# Patient Record
Sex: Female | Born: 1945 | Race: White | Hispanic: No | Marital: Single | Smoking: Never smoker
Health system: Southern US, Community
[De-identification: ages and names within clinical notes are randomized; demographics above are authoritative.]

## PROBLEM LIST (undated history)

## (undated) DIAGNOSIS — I10 Essential (primary) hypertension: Secondary | ICD-10-CM

## (undated) DIAGNOSIS — E785 Hyperlipidemia, unspecified: Secondary | ICD-10-CM

## (undated) DIAGNOSIS — E079 Disorder of thyroid, unspecified: Secondary | ICD-10-CM

## (undated) DIAGNOSIS — K219 Gastro-esophageal reflux disease without esophagitis: Secondary | ICD-10-CM

## (undated) NOTE — Telephone Encounter (Signed)
Formatting of this note might be different from the original.  I called to speak with Valeria Batman about the results of the total spine MRI, as reviewed in film conference with Dr. Katrinka Blazing.  The study/studies reveal degenerative changes with only mild stenosis in the cervical and thoracic regions; there is no cord compression or high grade canal stenosis.  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  there is no high grade canal stenosis.  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  The patient was very disheartened to hear that the cause of the incontinence is still unknown, however, was appreciative of the information.  If she becomes more symptomatic from the lumbar foraminal stenosis, she may benefit from a steroid injection.    Additionally, the imaging reported incidental findings of hepatic and splenic cysts with recommendations for ultrasound, if not previously investigated.  The patient is from [place] and will follow up locally.  She did not have the PCP information available to her at the time of the call, but plans to call us back with the doctor's name and number.  I will send the imaging results and our recommendations to the PCP once I have the contact information.  She requested a copy of the results and notes so was provided with the phone number to Medical Records.       The patient is always welcome to contact our office or return to clinic with additional questions/concerns.      Electronically signed by Daiva Nakayama, PA-C at 08/07/2016  4:22 PM EDT

---

## 2014-01-07 IMAGING — MG MAMMOGRAPHY SCREENING BILATERAL 3D TOMOSYNTHESIS WITH CAD
12 of 16 series · 12 of 32 positions shown · non-contrast
Comparison: January 16, 2011
BREAST DENSITY: (Level B) There are scattered areas of fibroglandular density.

MAMMOGRAPHY SCREENING 3D TOMOSYNTHESIS WITH CAD, 01/07/2014 [DATE]:
HISTORY: Screening
TECHNIQUE: Bilateral oblique mediolateral and craniocaudal full field digital
mammogram and 3-D Tomosynthesis were obtained.  In addition, computer-aided
detection was utilized.

[R MLO synth-2D]
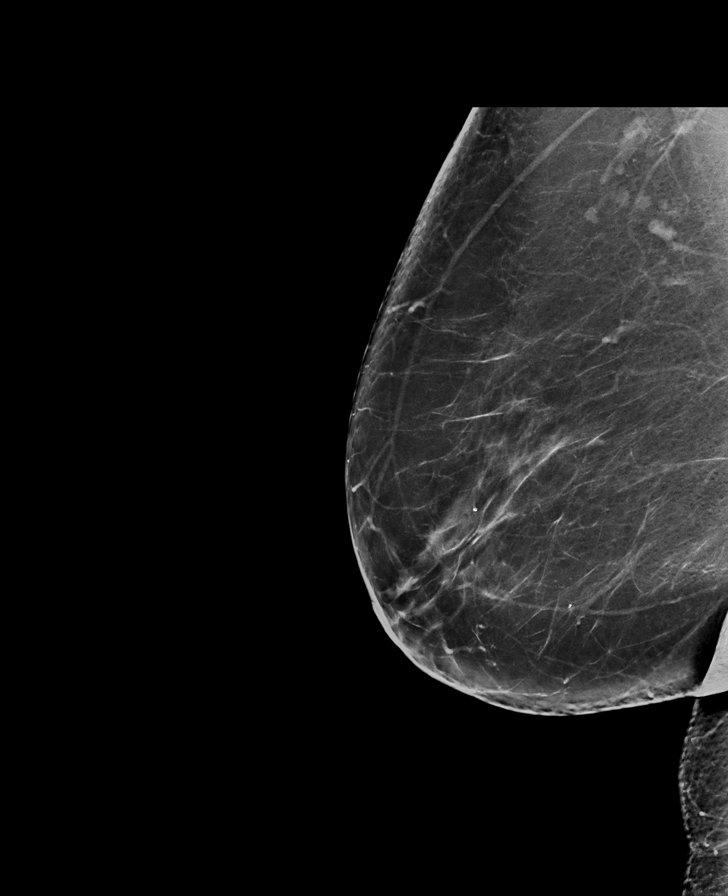

[L MLO synth-2D]
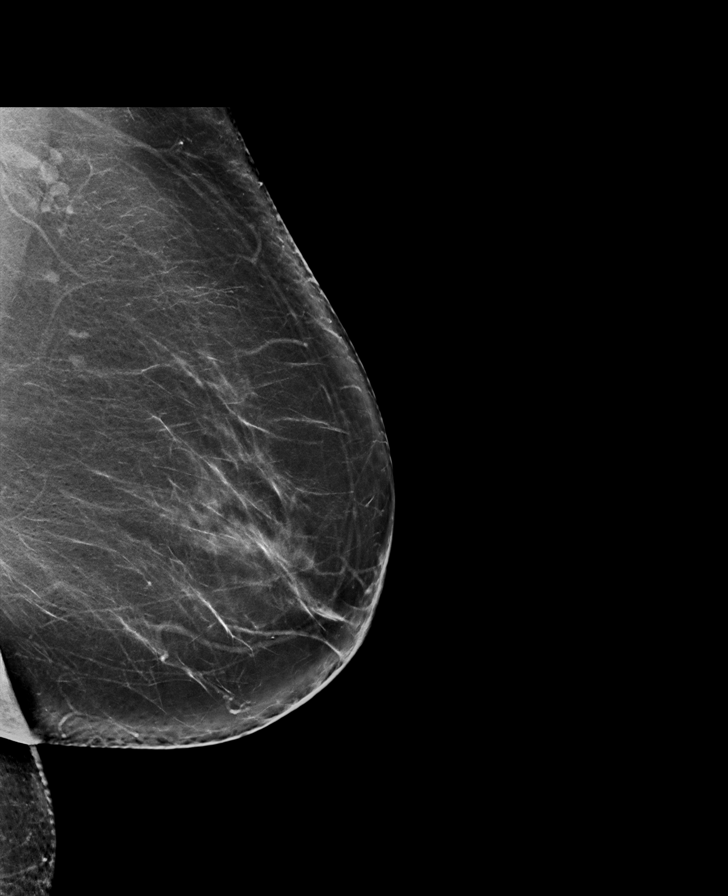

[R CC synth-2D]
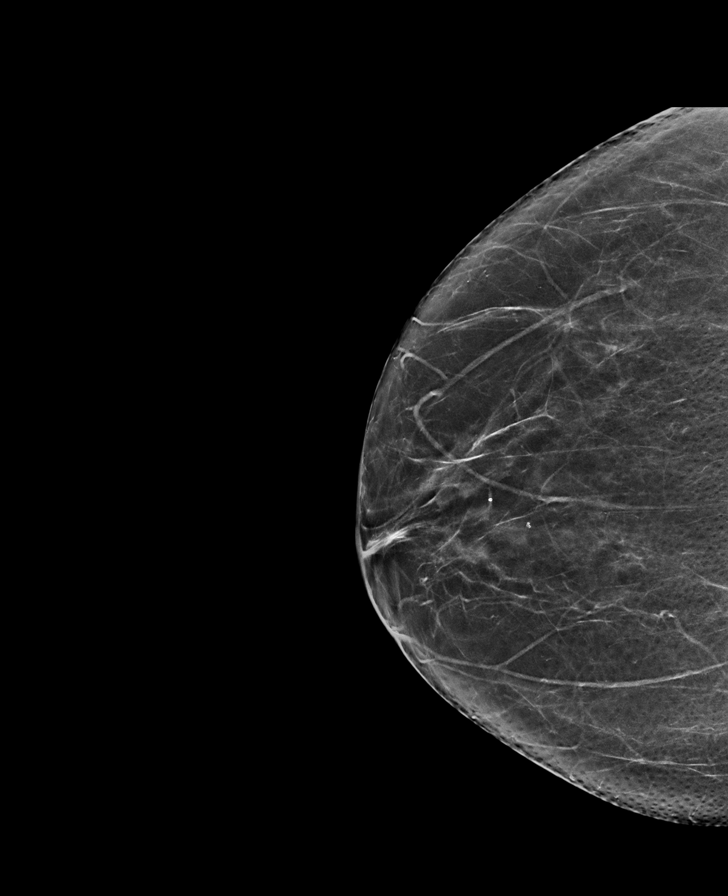

[R MLO]
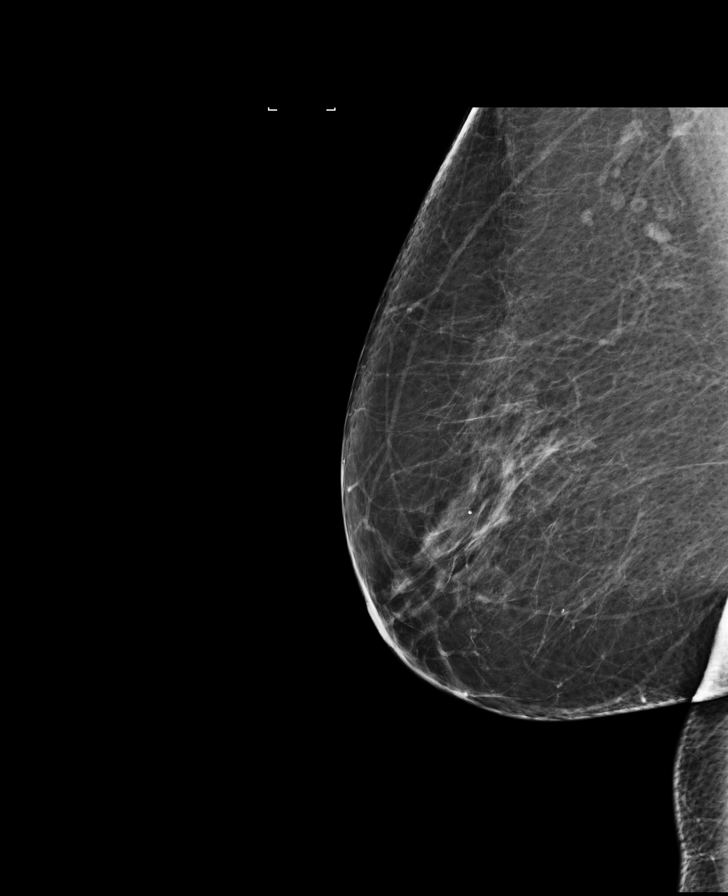

[L CC synth-2D]
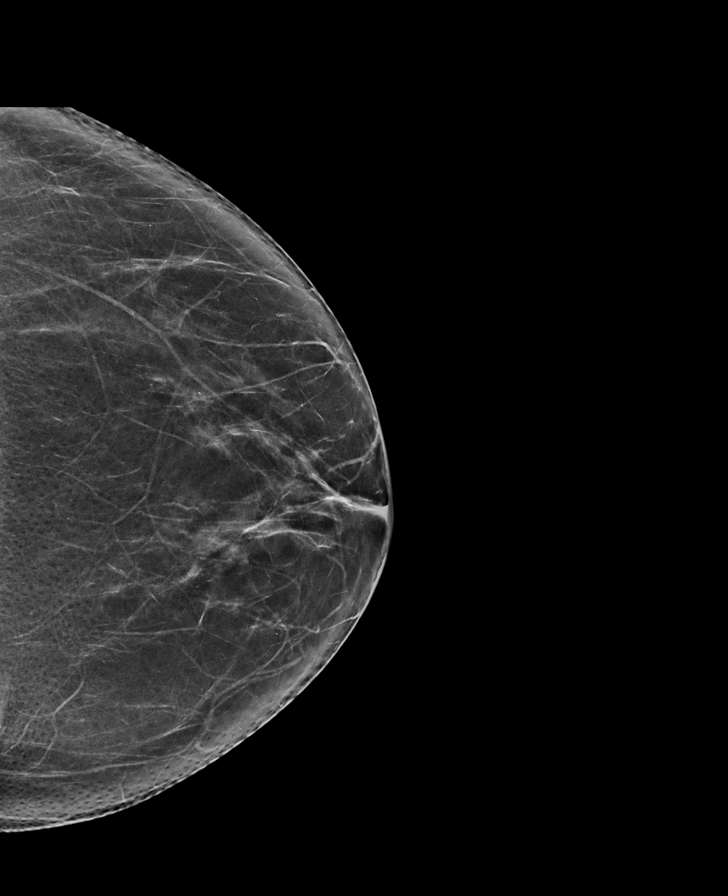

[L MLO]
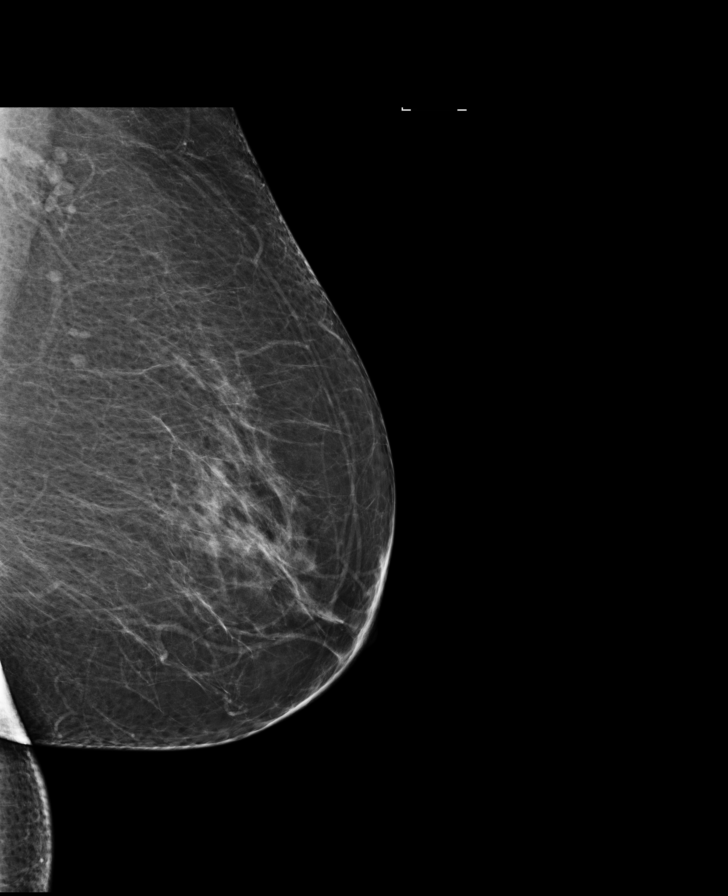

[R CC]
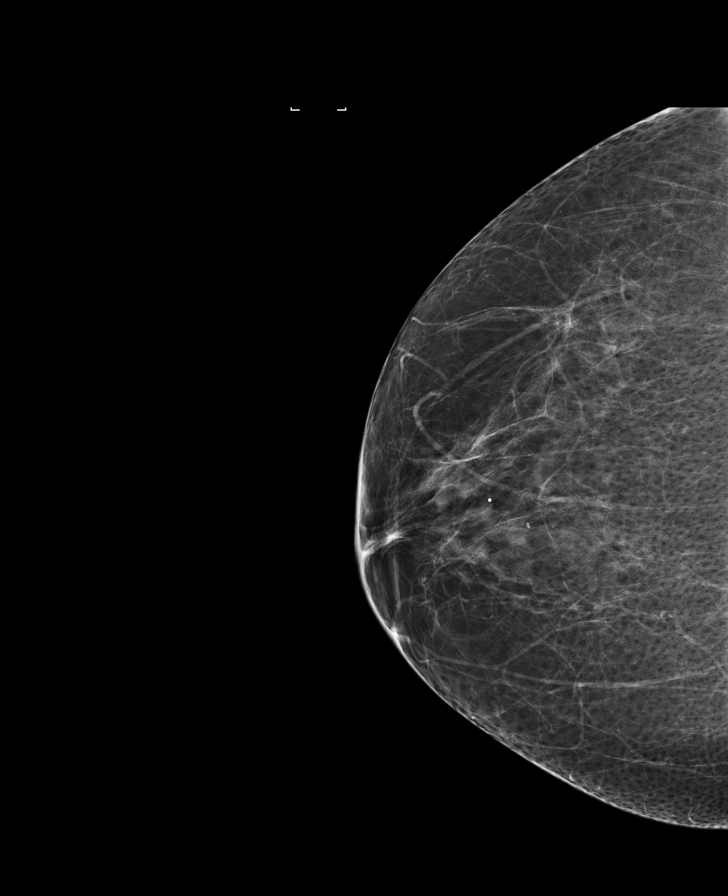

[L CC]
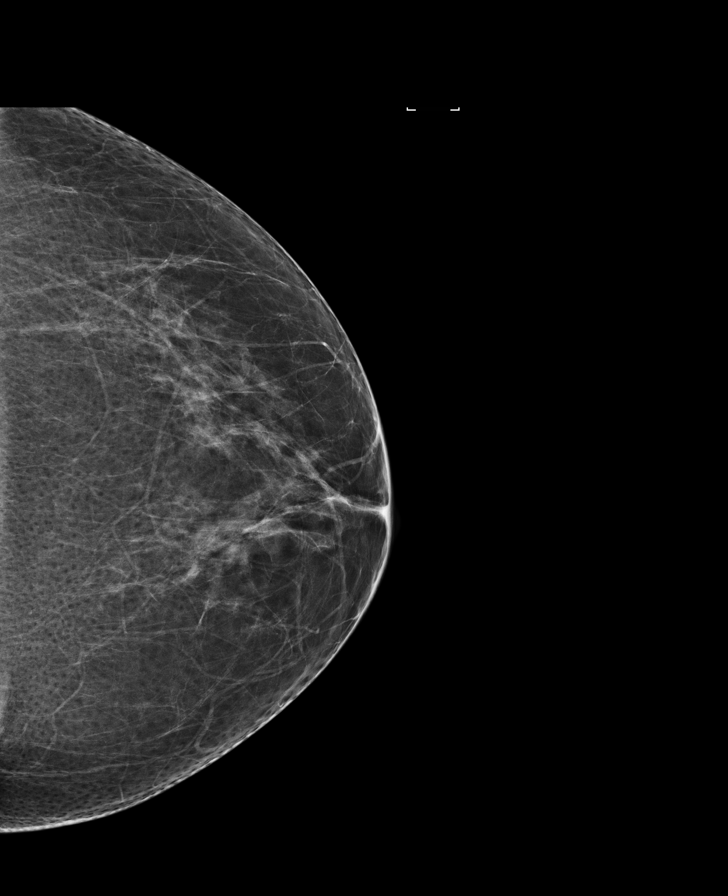

[R CC tomo]
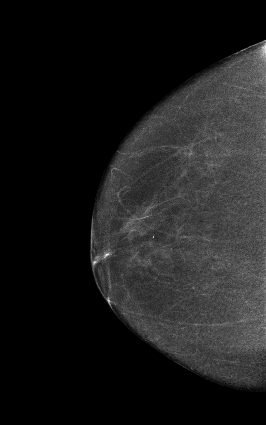

[R MLO tomo]
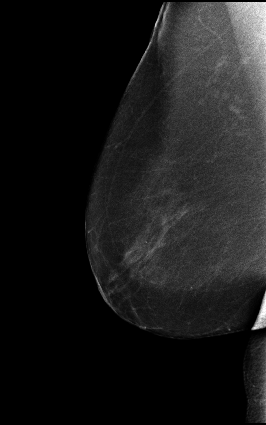

[L MLO tomo]
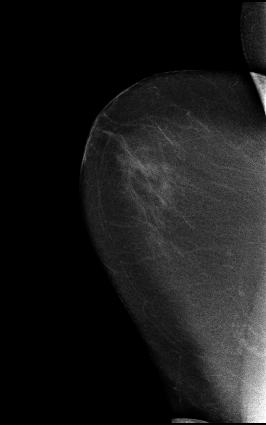

[L CC tomo]
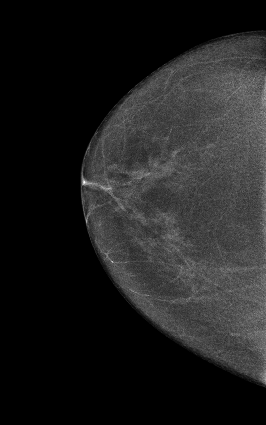

[12 of 32 positions shown; findings below may reference images not displayed]

FINDINGS: There is no dominant or new, developing mass or suspicious cluster of
microcalcifications. There is no skin thickening, architectural distortion or
nipple retraction.
IMPRESSION: ( BI-RADS 2) Benign findings. Routine mammographic follow-up is recommended.

## 2016-11-29 IMAGING — CT CT ABDOMEN WITHOUT CONTRAST
2 of 3 series · 16 of 46 positions shown, 18 images · non-contrast
Comparison: There are no prior exam(s) available for comparison within the
past
12 months; however, comparison was made to the prior exam(s) dated  CT abdomen

CT ABDOMEN WITHOUT CONTRAST, 11/29/2016 [DATE]:
CLINICAL INDICATION:  Left upper quadrant mass
A search for DICOM formatted images was conducted for prior CT imaging studies
completed at a non-affiliated media free facility.
TECHNIQUE: The abdomen was scanned from lung bases through the aortic
bifurcation without contrast on a high resolution CT scanner using dose
reduction techniques. Routine MPR reconstructions were performed.

[Series 2: abd/pel ax wo · axial · 0.94mm/px · z∈[-269,-17]mm · 13 of 98 slices shown, 15 images]
[im 7/98  soft-tissue]
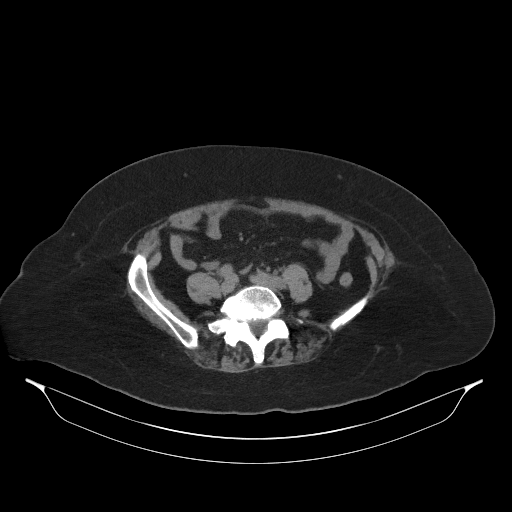
[im 7/98  bone]
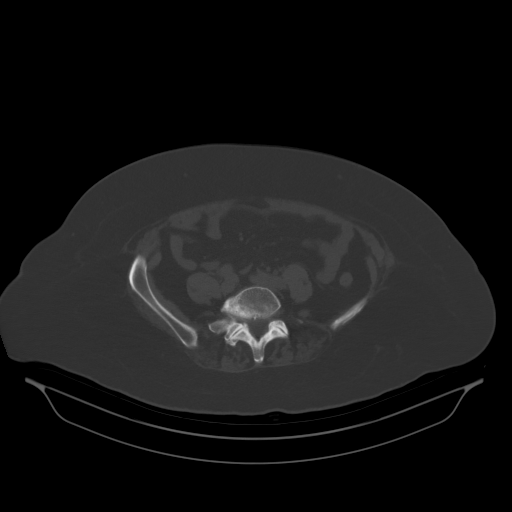
[im 13/98  soft-tissue]
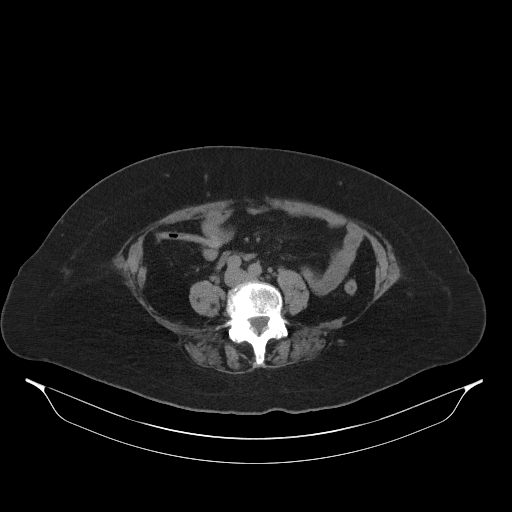
[im 19/98  soft-tissue]
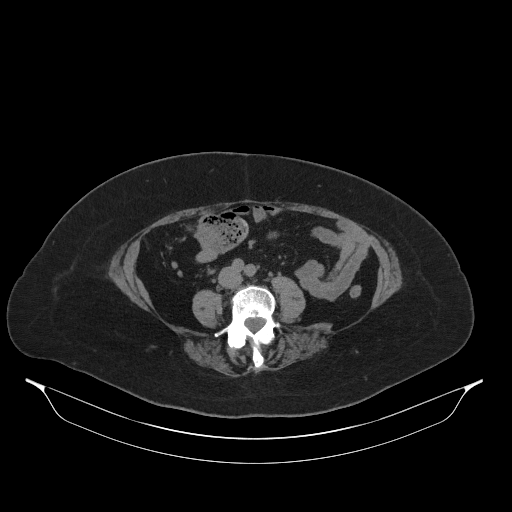
[im 29/98  soft-tissue]
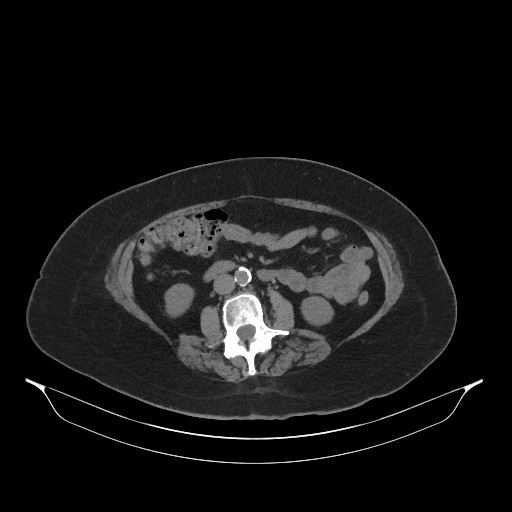
[im 35/98  soft-tissue]
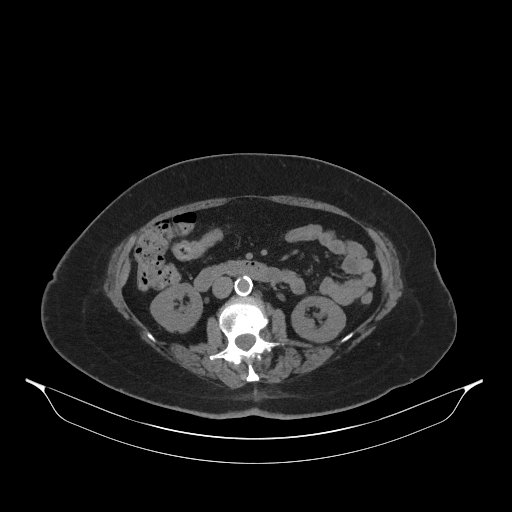
[im 41/98  soft-tissue]
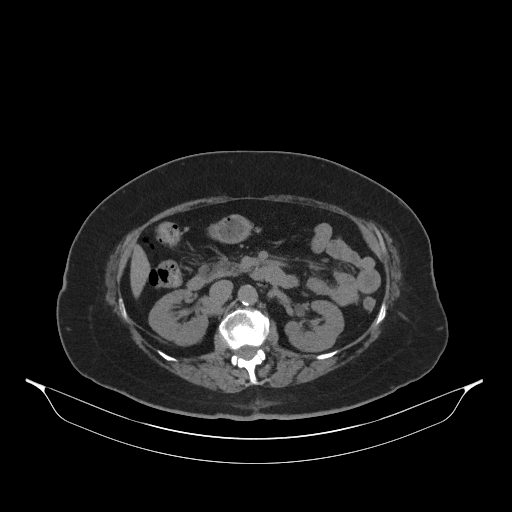
[im 51/98  soft-tissue]
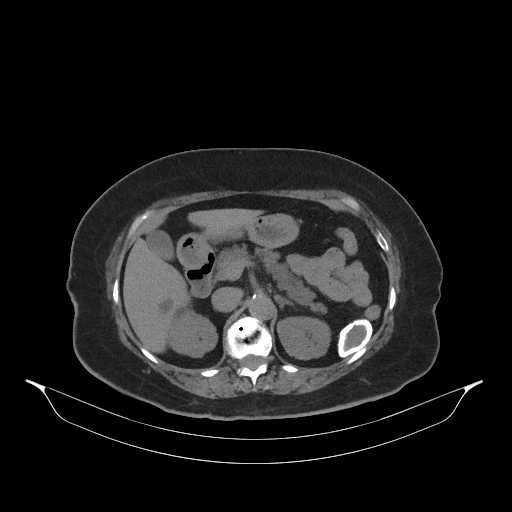
[im 57/98  soft-tissue]
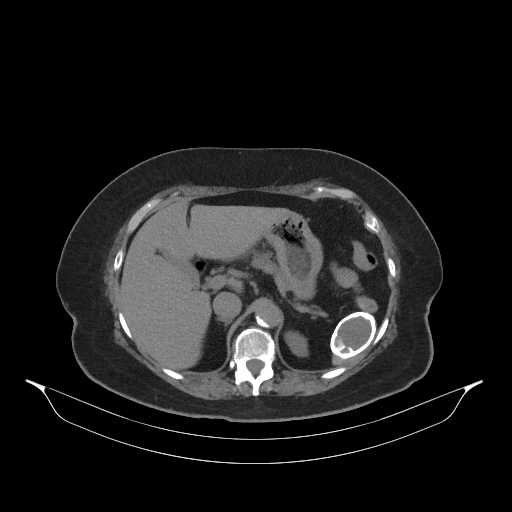
[im 63/98  soft-tissue]
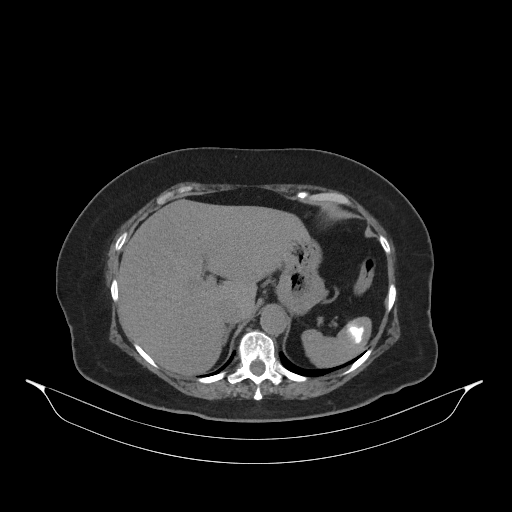
[im 63/98  bone]
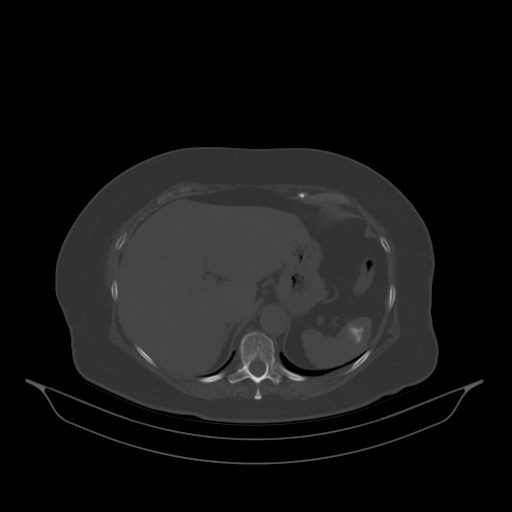
[im 69/98  soft-tissue]
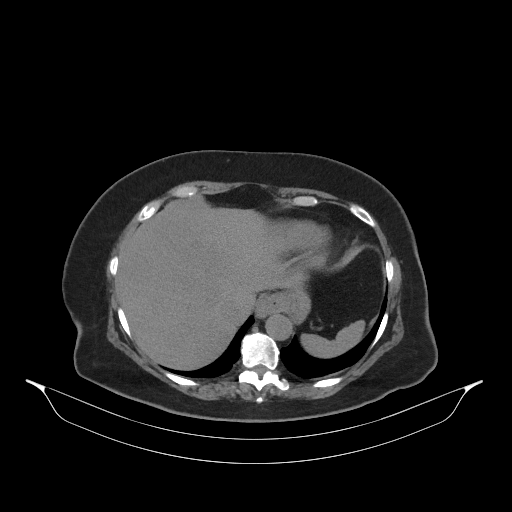
[im 79/98  soft-tissue]
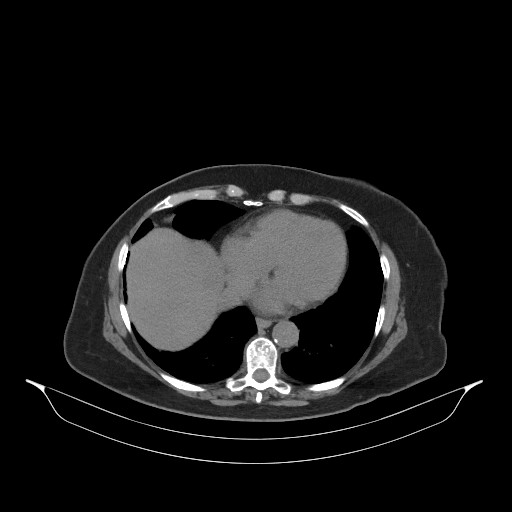
[im 85/98  soft-tissue]
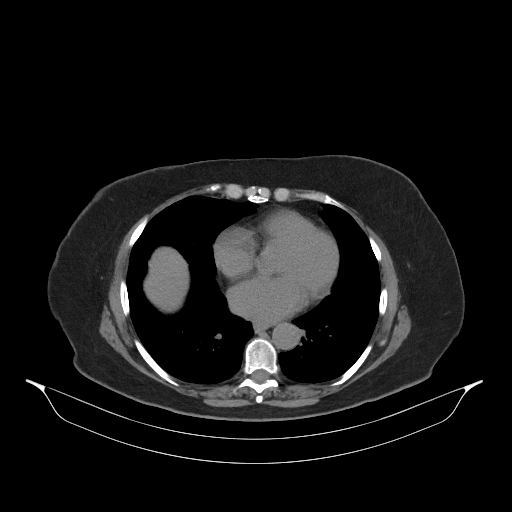
[im 91/98  soft-tissue]
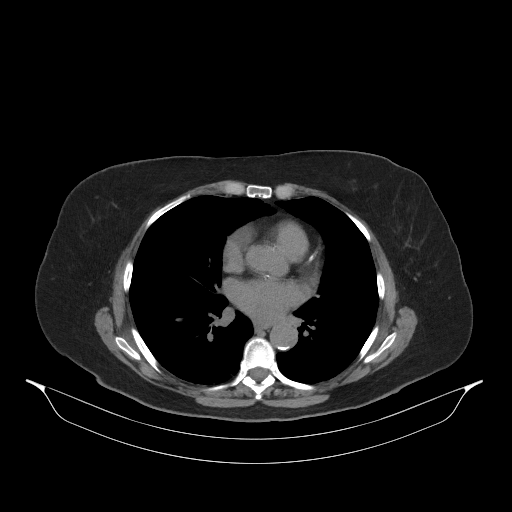

[Series 3: abd/pel cor wo · coronal · 0.57mm/px · 3 of 129 slices shown]
[im 43/129  soft-tissue]
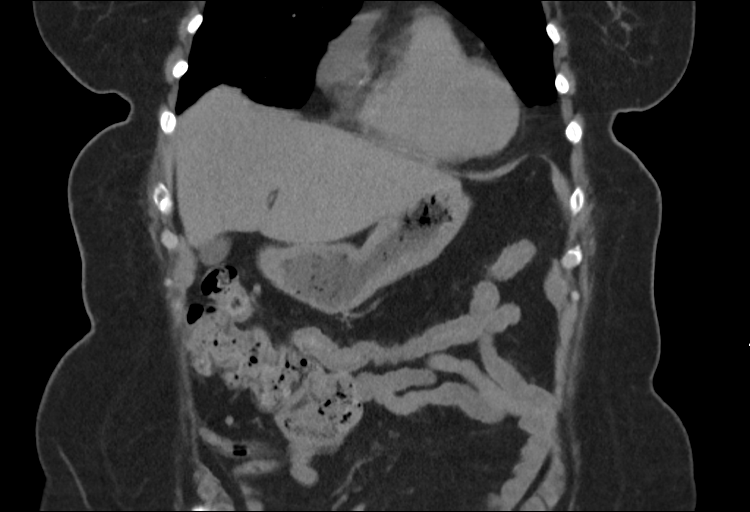
[im 57/129  soft-tissue]
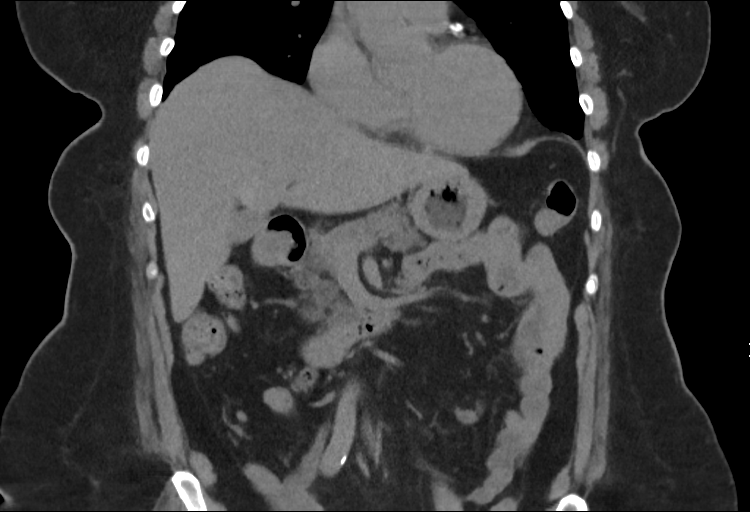
[im 72/129  soft-tissue]
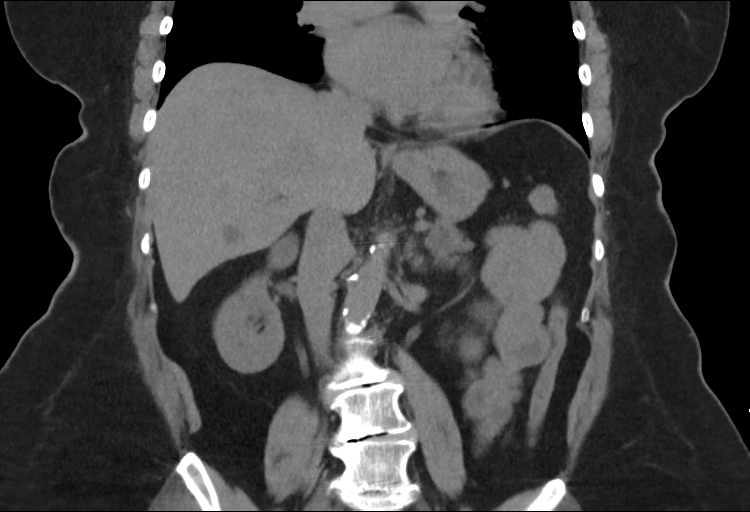

[16 of 46 positions shown; findings below may reference images not displayed]

FINDINGS: Lung bases are clear. Heart size is borderline prominent. There are
moderate coronary artery calcifications. There is no pleural effusion. A
chronic
calcified splenic cyst is stable compared with 3199, likely sequelae of prior
infection or hemorrhage. This measures 4.7 x 3.2 cm. There is a septated right
hepatic lobe cyst which also show stability. There is no adenopathy or
ascites.
Kidneys are unremarkable. No adrenal mass. Pancreas is normal. Gallbladder is
unremarkable.
IMPRESSION: Chronic calcified splenic cyst is stable compared to 3199. Small septated
right
hepatic lobe cyst also show stability. No evidence for developing mass,
adenopathy or active inflammatory process.
Moderate coronary artery calcifications.
RADIATION DOSE REDUCTION: All CT scans are performed using radiation dose
reduction techniques, when applicable.  Technical factors are evaluated and
adjusted to ensure appropriate moderation of exposure.  Automated dose
management technology is applied to adjust the radiation doses to minimize
exposure while achieving diagnostic quality images.

## 2017-01-29 IMAGING — MG MAMMOGRAPHY SCREENING BILATERAL 3D TOMOSYNTHESIS WITH CAD
12 series · 12 of 28 positions shown · non-contrast
Comparison: 01/07/2014
BREAST DENSITY: (Level A) The breasts are almost entirely fatty.

MAMMOGRAPHY SCREENING BILATERAL 3D TOMOSYNTHESIS WITH CAD, 01/29/2017 [DATE]:
CLINICAL INDICATION: Screening.
TECHNIQUE: Digital bilateral mammograms and 3-D Tomosynthesis were obtained.
These were interpreted both primarily and with the aid of computer-aided
detection system.

[R MLO synth-2D]
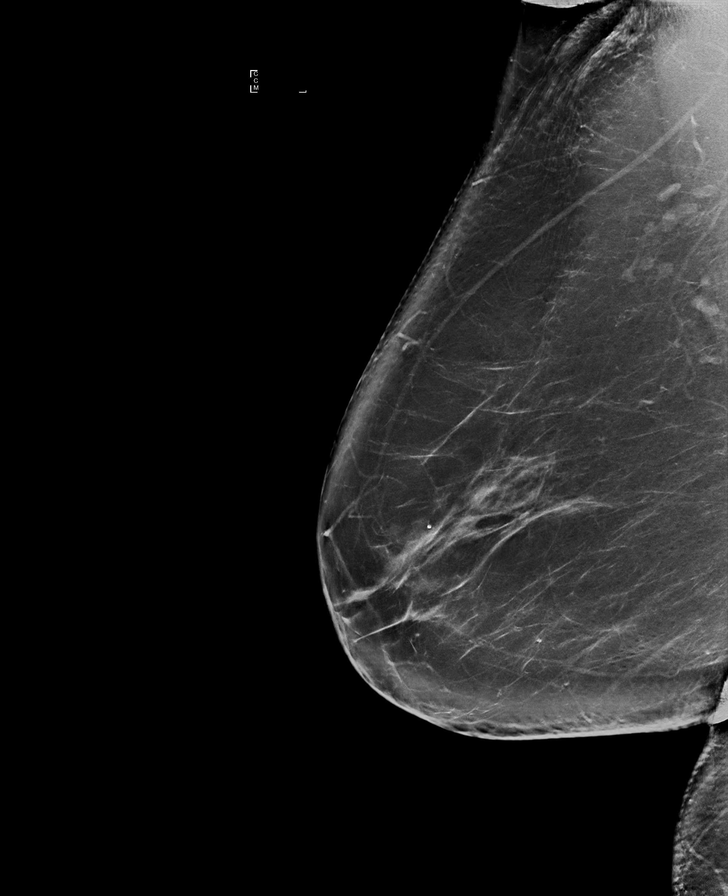

[L CC synth-2D]
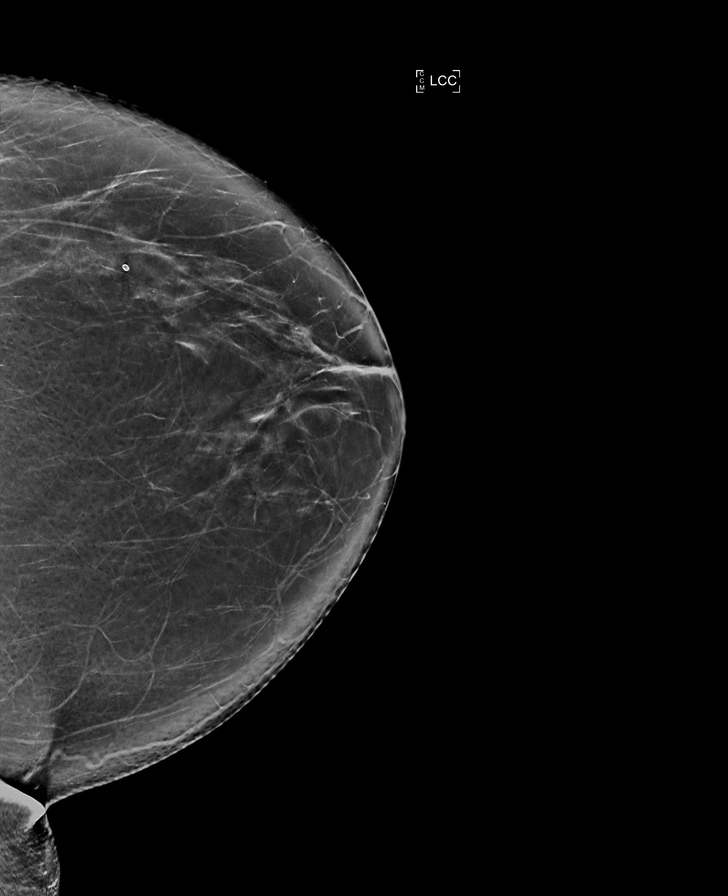

[L MLO synth-2D]
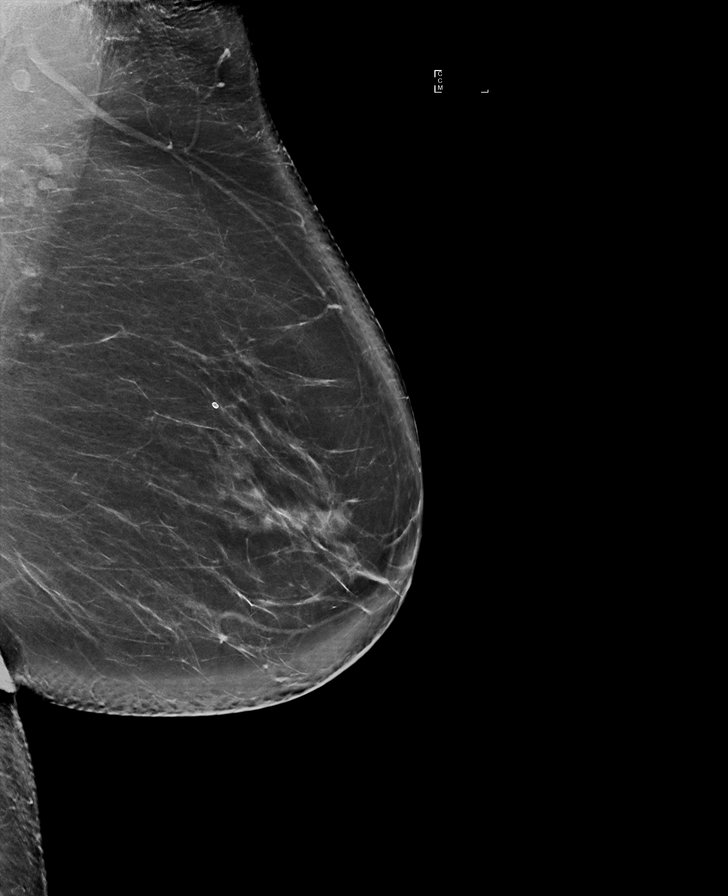

[R CC synth-2D]
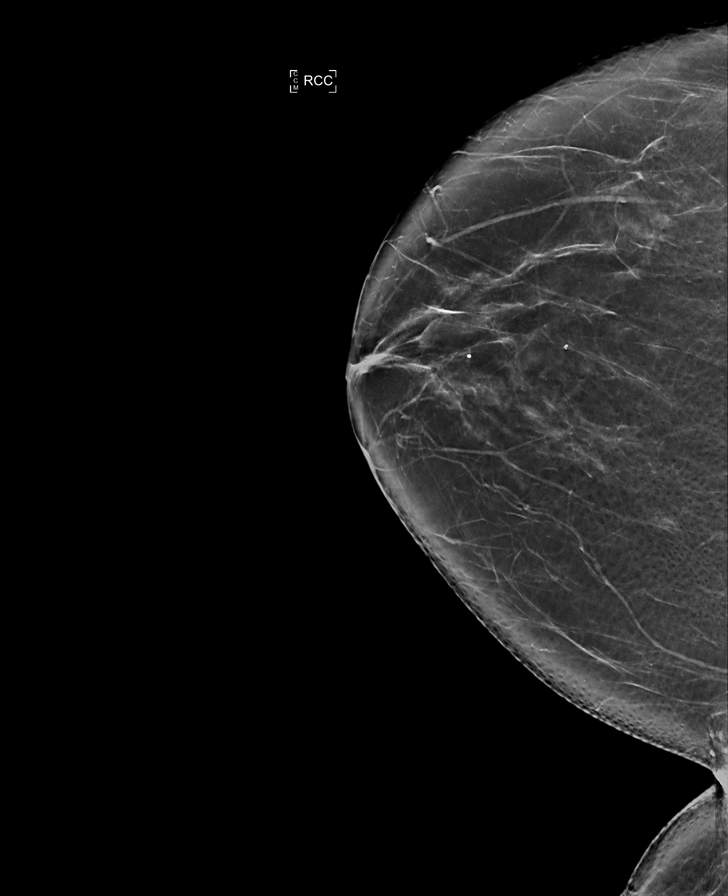

[R MLO tomo (1 of 2)]
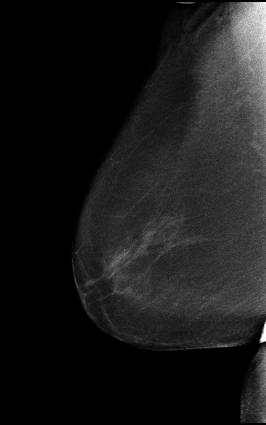

[R CC tomo (1 of 2)]
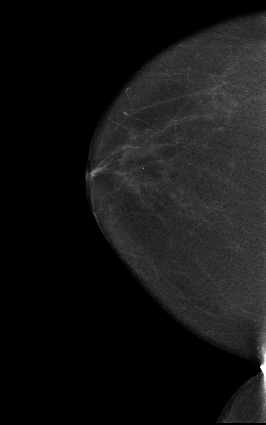

[L CC tomo (1 of 2)]
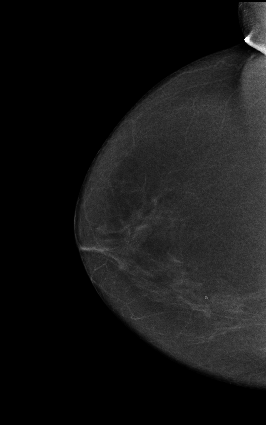

[L MLO tomo (1 of 2)]
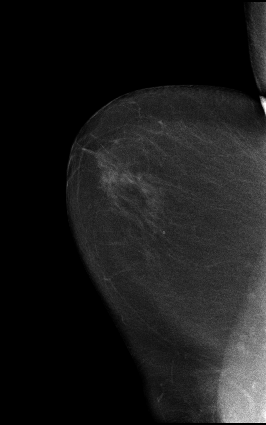

[R CC tomo (2 of 2) · tomo slice 45/90.0]
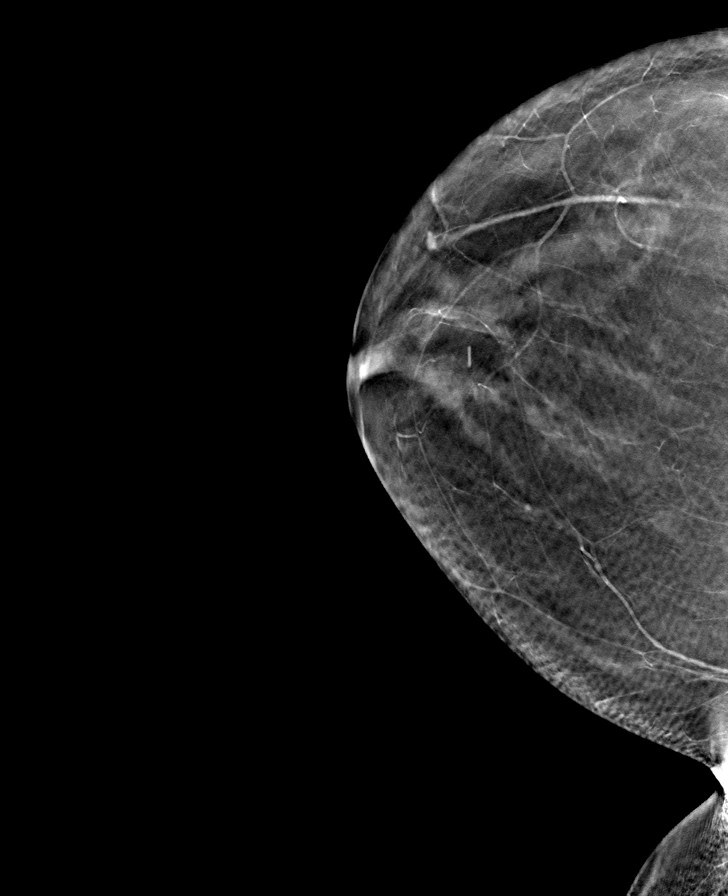

[L MLO tomo (2 of 2) · tomo slice 54/107.0]
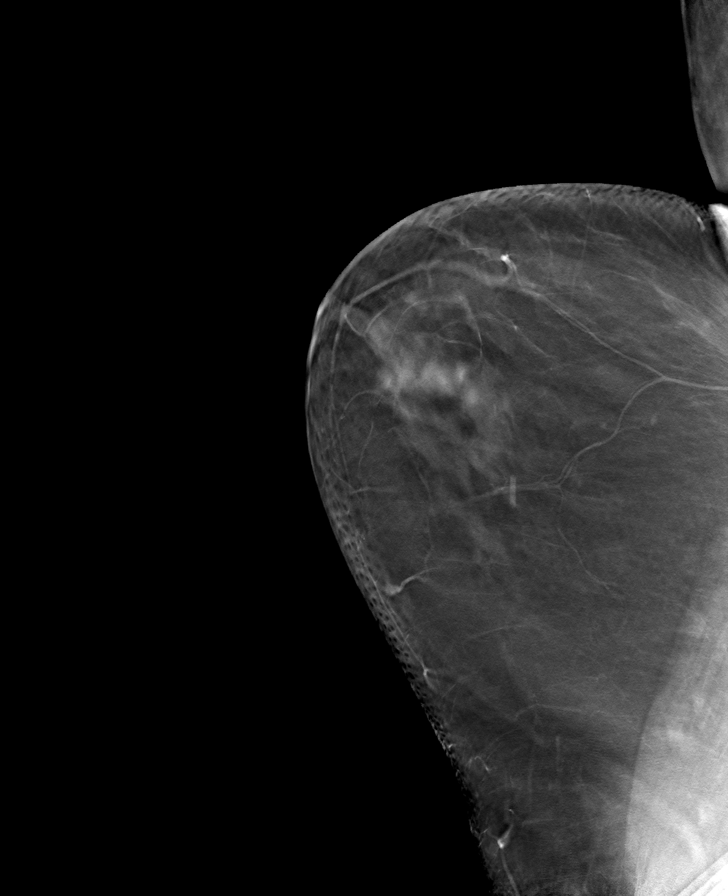

[R MLO tomo (2 of 2) · tomo slice 57/113.0]
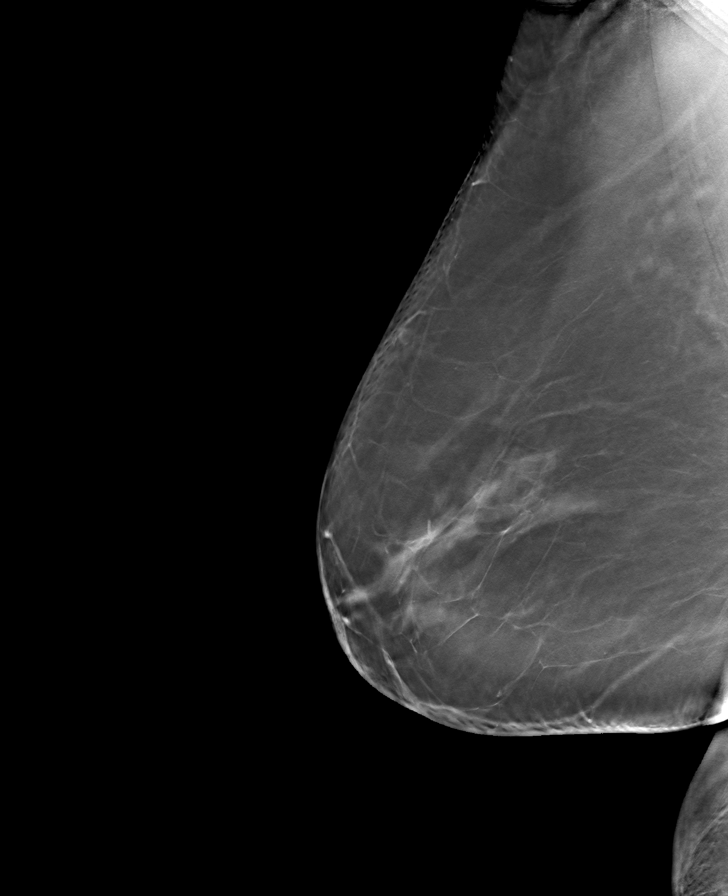

[L CC tomo (2 of 2) · tomo slice 47/92.0]
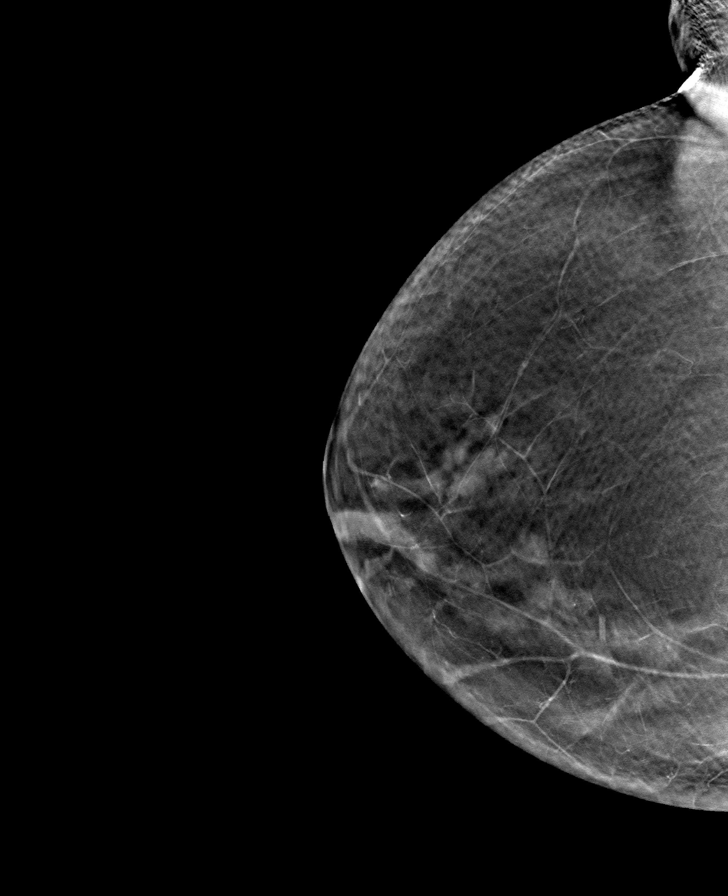

[12 of 28 positions shown; findings below may reference images not displayed]

FINDINGS: No mass, area of architectural distortion or suspicious
calcification. No mammographic evidence of malignancy.
IMPRESSION: ( BI-RADS 1) Negative mammogram. Routine mammographic follow-up is
recommended.

## 2019-01-06 IMAGING — CT CT ABDOMEN AND PELVIS WITH CONTRAST
2 of 3 series · 16 of 46 positions shown, 18 images · IV contrast (APPLIED)
Comparison: 11/29/2016

CT ABDOMEN AND PELVIS WITH CONTRAST, 01/06/2019 [DATE]: 
CLINICAL INDICATION:  72-year-old female with 30 pound weight loss over one 
year, left lower quadrant pain, change in bowels 
A search for DICOM formatted images was conducted for prior CT imaging studies 
completed at a non-affiliated media free facility.
TECHNIQUE: The abdomen and pelvis were scanned from lung bases through the 
pubic rami with 100 cc's of Isovue 300 injected intravenously on a 
high-resolution Ct scanner using dose reduction techniques.  Routine MPR 
reconstructions were performed.

[Series 4: abd/pel ax w · axial · 0.81mm/px · z∈[-385,+8]mm · 13 of 151 slices shown, 15 images]
[im 10/151  soft-tissue]
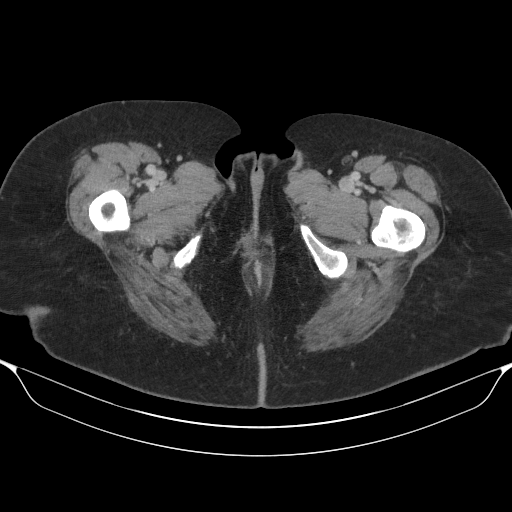
[im 10/151  bone]
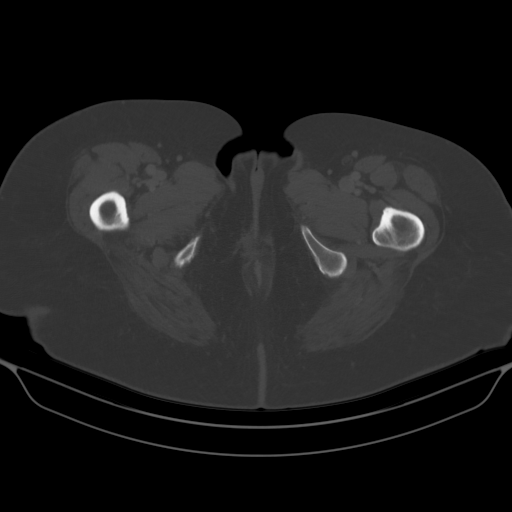
[im 20/151  soft-tissue]
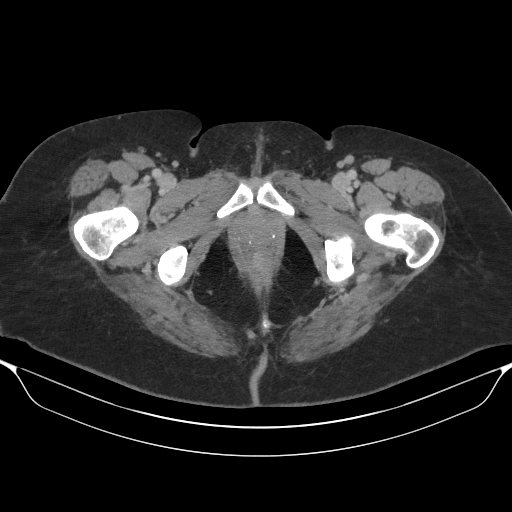
[im 30/151  soft-tissue]
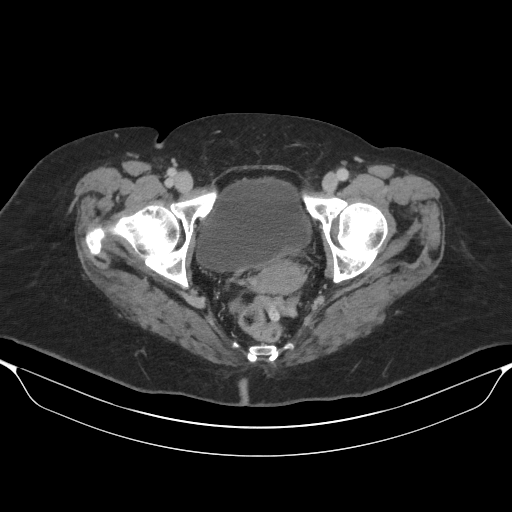
[im 44/151  soft-tissue]
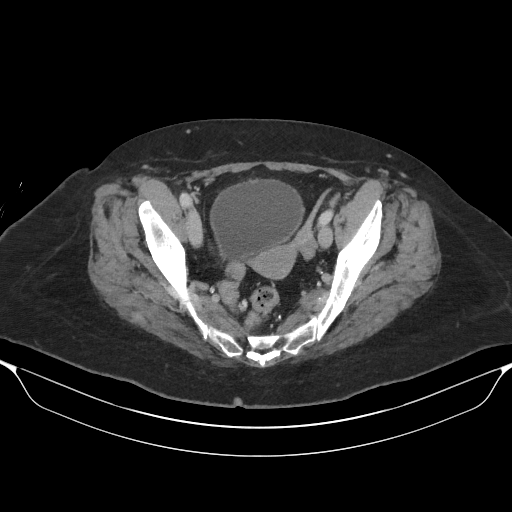
[im 54/151  soft-tissue]
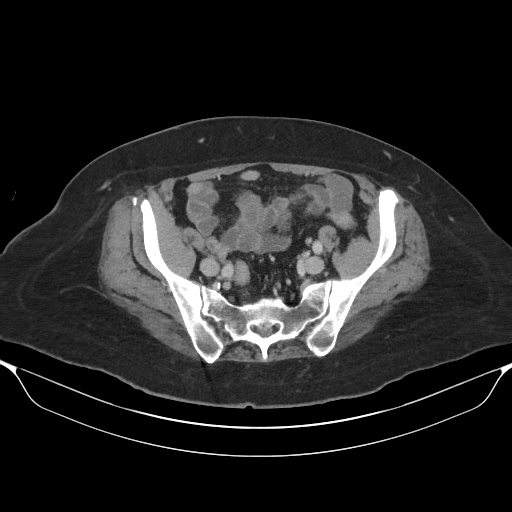
[im 63/151  soft-tissue]
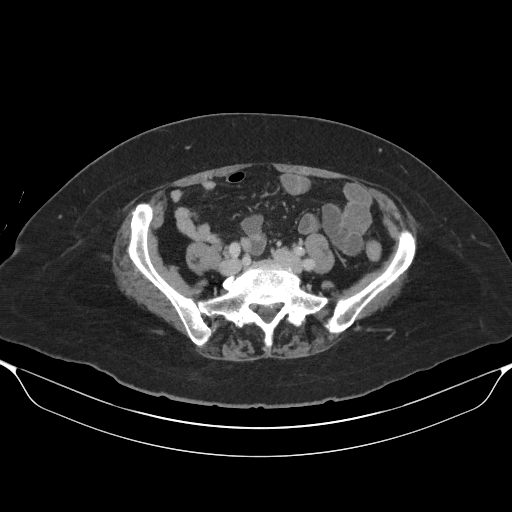
[im 78/151  soft-tissue]
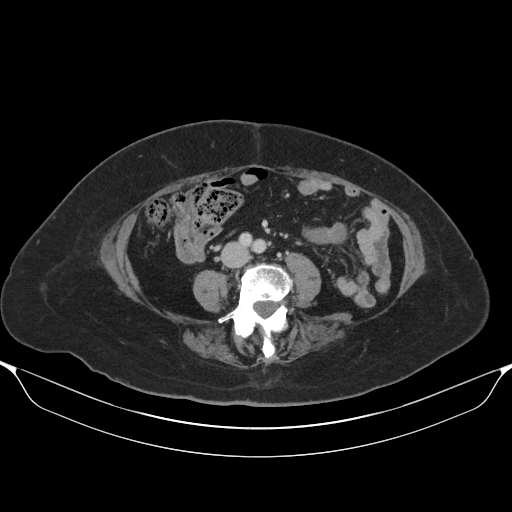
[im 88/151  soft-tissue]
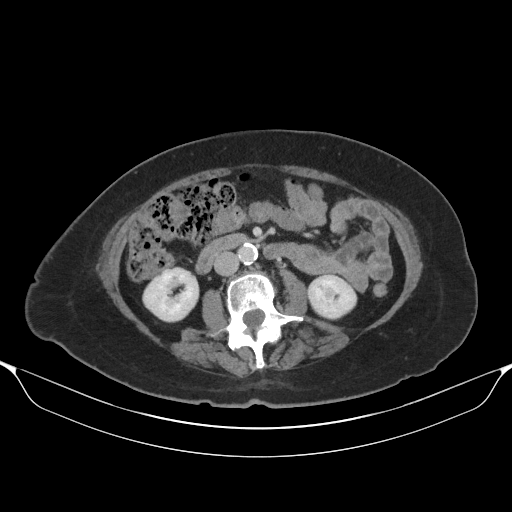
[im 97/151  soft-tissue]
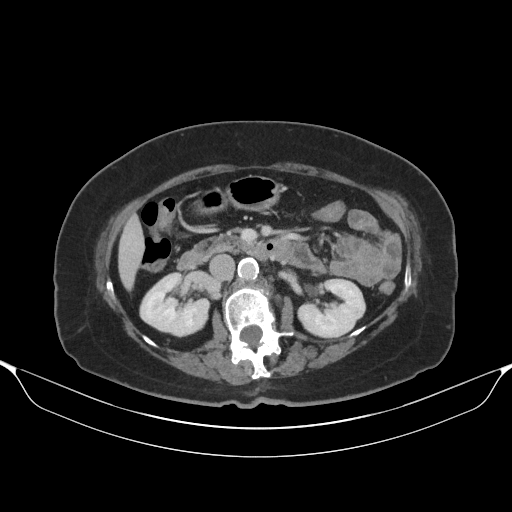
[im 97/151  bone]
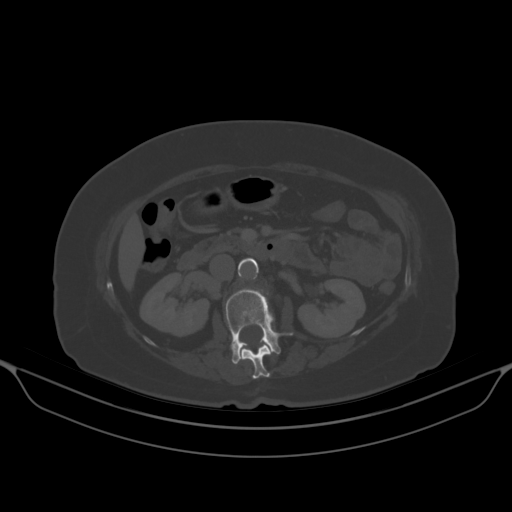
[im 107/151  soft-tissue]
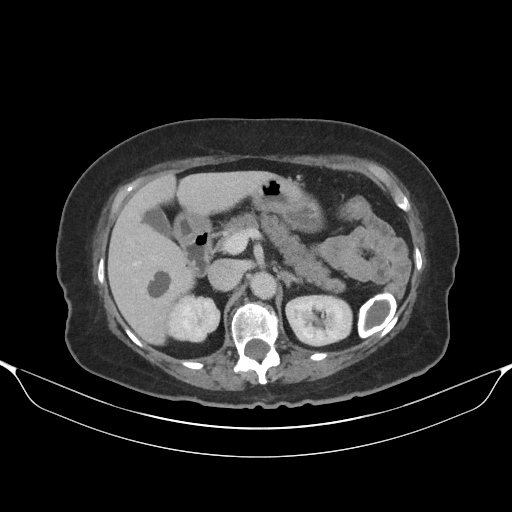
[im 121/151  soft-tissue]
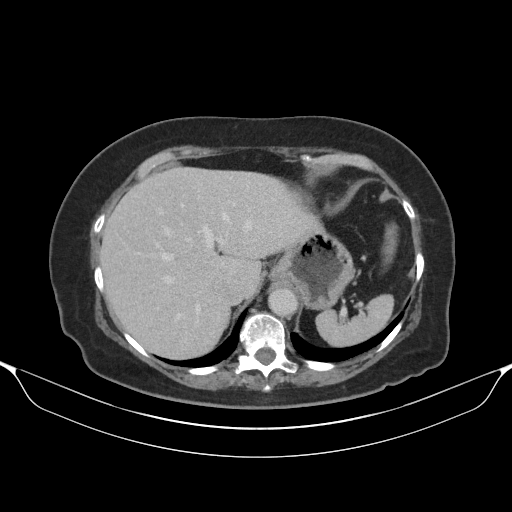
[im 131/151  soft-tissue]
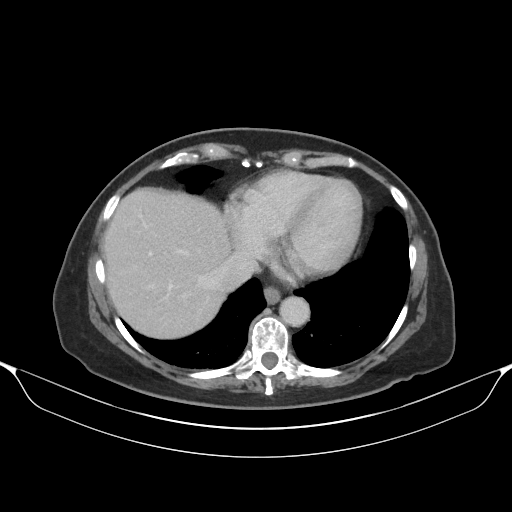
[im 141/151  soft-tissue]
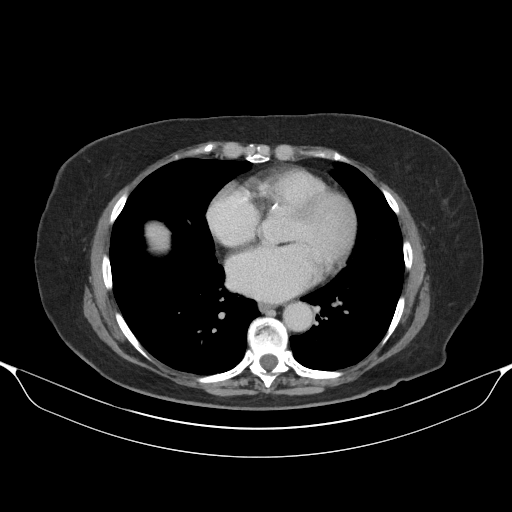

[Series 5: abd/pel cor w · coronal · 0.90mm/px · 3 of 115 slices shown]
[im 39/115  soft-tissue]
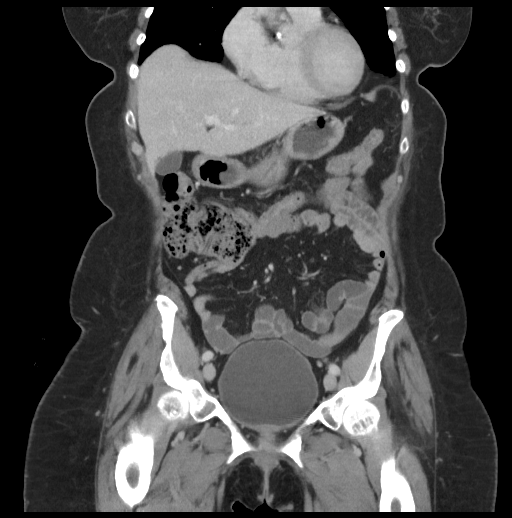
[im 51/115  soft-tissue]
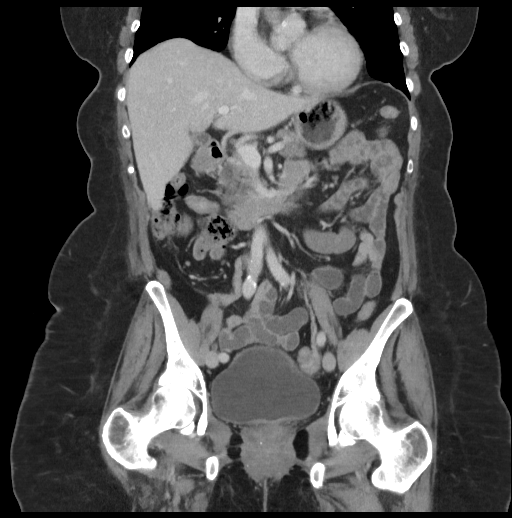
[im 64/115  soft-tissue]
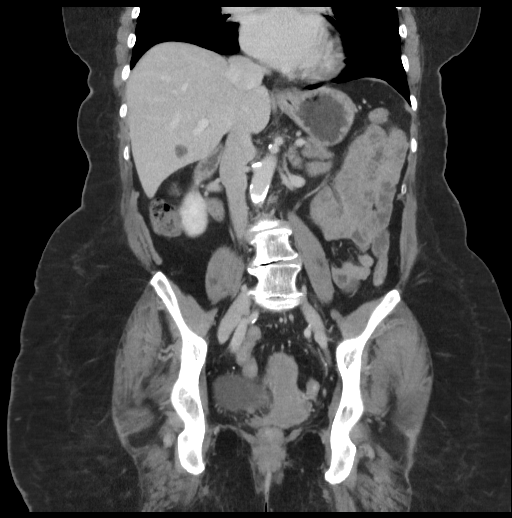

[16 of 46 positions shown; findings below may reference images not displayed]

FINDINGS: Mild scarring in both lung bases. Small hiatal hernia. 2.3 x 1.6 cm 
right hepatic lobe cyst unchanged. Normal enhancement of the liver with mild 
fatty infiltration adjacent to the fissure for ligamentum teres. Stable 
calcified splenic cyst. Otherwise normal enhancement of the normal sized spleen. 
Normal appearance of the pancreas with mild head atrophy. Normal appearance of 
the adrenal glands and kidneys with small intracortical cysts. Atherosclerotic 
aorta. Nonobstructive bowel pattern. Moderately distended urinary bladder 
contains air bubbles. Normally anteverted uterus. 
Rectosigmoid diverticulosis. Collapsed descending colon, transverse colon. 
Stool-filled right colon. Normal appendix. No transition zone identified. No 
evidence for mass. 
Small periumbilical hernia contains fat. Dextroscoliosis lumbosacral spine. 
Multilevel degenerative disc disease without compression fracture. Anterior left 
costophrenic angle hernia versus partial eventration of the diaphragm with fat 
in the anterior left lung base.
IMPRESSION: No etiology for clinical symptoms identified. Right colonic stool. The remainder 
of the colon is decompressed. Mild diverticulosis. No diverticulitis or discrete 
colon mass identified, limited assessment with CT. Small hiatal hernia. 
Air bubbles and urinary bladder. Consider recent catheterization, gas-forming 
infection or fistula. 
Mild fatty liver with stable septated cyst. Stable calcified splenic cyst. 
RADIATION DOSE REDUCTION: All CT scans are performed using radiation dose 
reduction techniques, when applicable.  Technical factors are evaluated and 
adjusted to ensure appropriate moderation of exposure.  Automated dose 
management technology is applied to adjust the radiation doses to minimize 
exposure while achieving diagnostic quality images.

## 2019-01-13 IMAGING — MG MAMMOGRAPHY SCREENING BILATERAL 3D TOMOSYNTHESIS WITH CAD
8 series · 9 of 24 positions shown · non-contrast
Comparison: Mammogram 01/29/2017. 
BREAST DENSITY: (Level B) There are scattered areas of fibroglandular density.

MAMMOGRAPHY SCREENING BILATERAL 3D TOMOSYNTHESIS WITH CAD, 01/13/2019 [DATE]: 
CLINICAL INDICATION: Screening.
TECHNIQUE: Digital bilateral mammograms and 3-D Tomosynthesis were obtained. 
These were interpreted both primarily and with the aid of computer-aided 
detection system.

[R MLO]
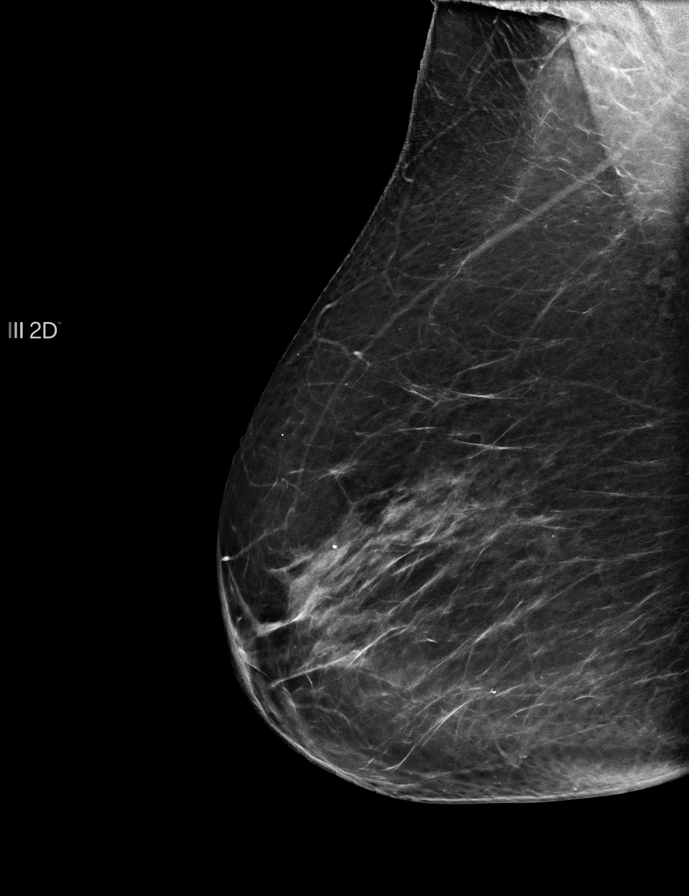

[R CC]
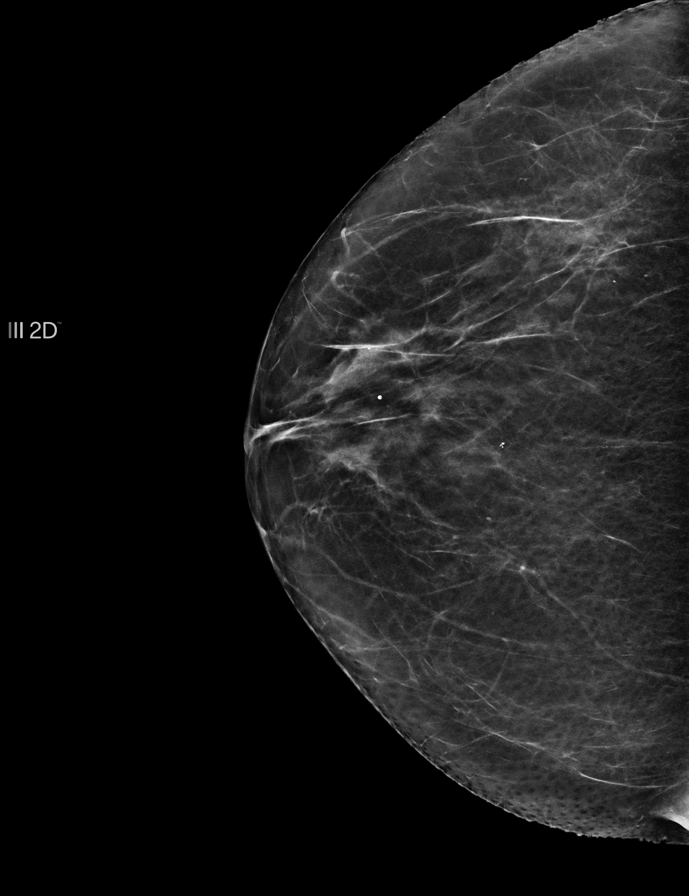

[L MLO]
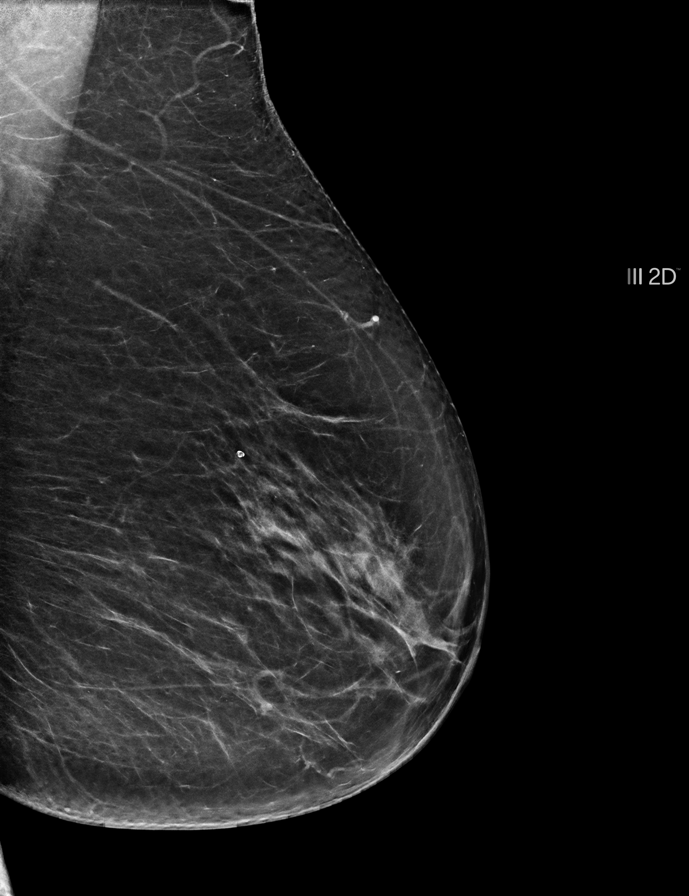

[L CC]
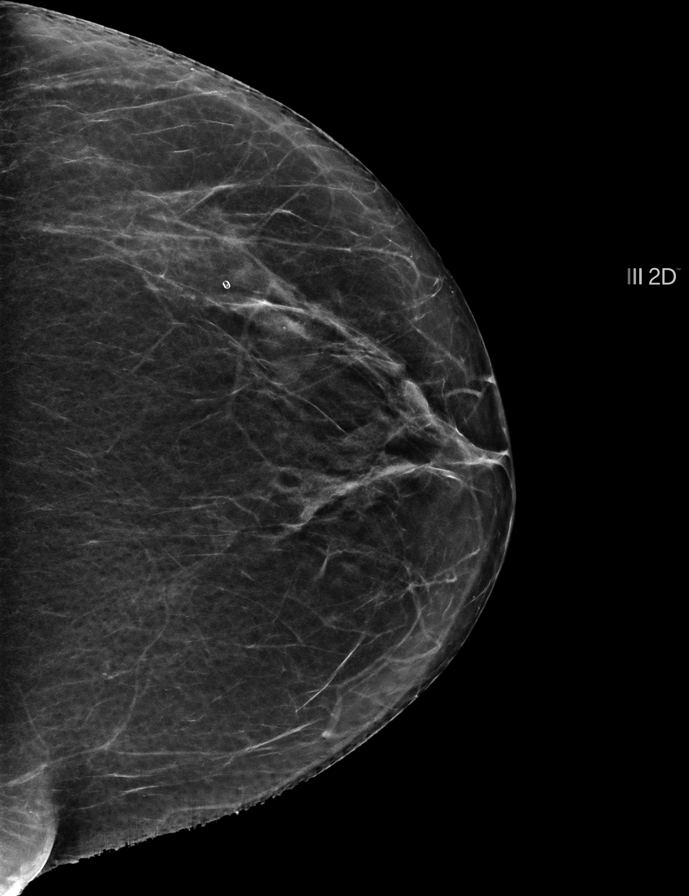

[L CC tomo · 2 of 51 frames shown]
[frame 17/51]
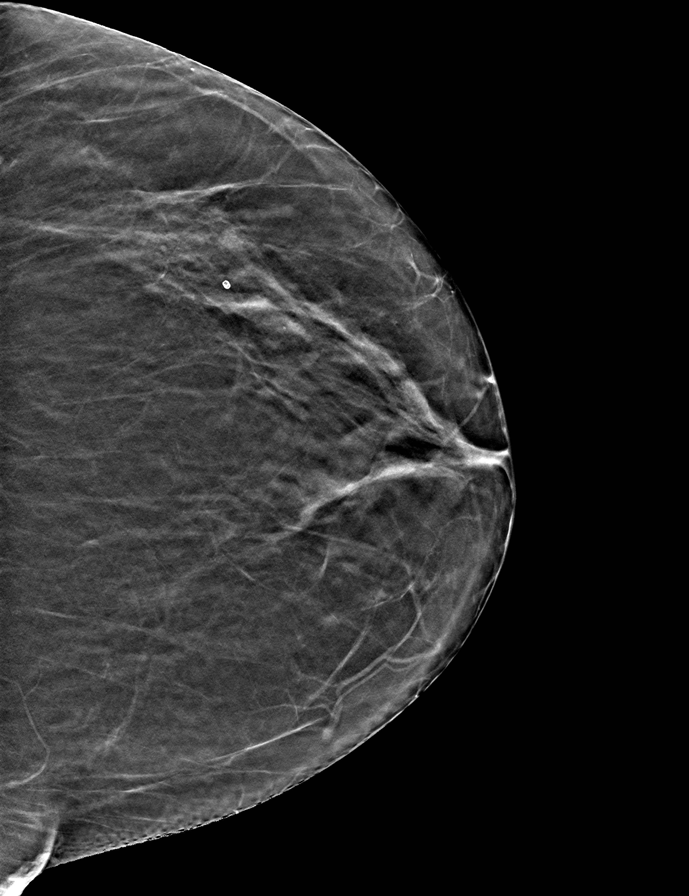
[frame 26/51]
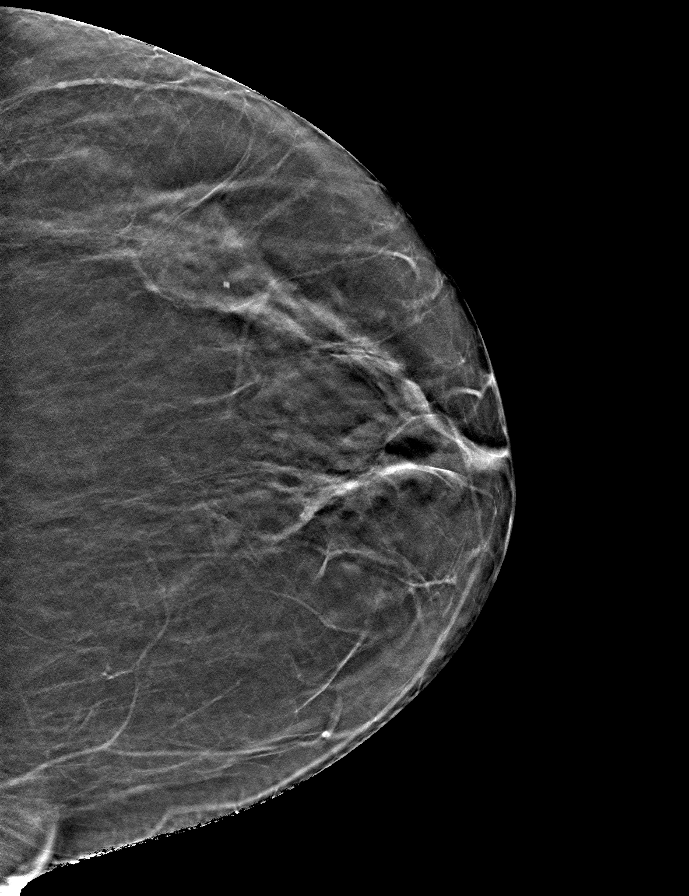

[R CC tomo · tomo slice 27/52.0]
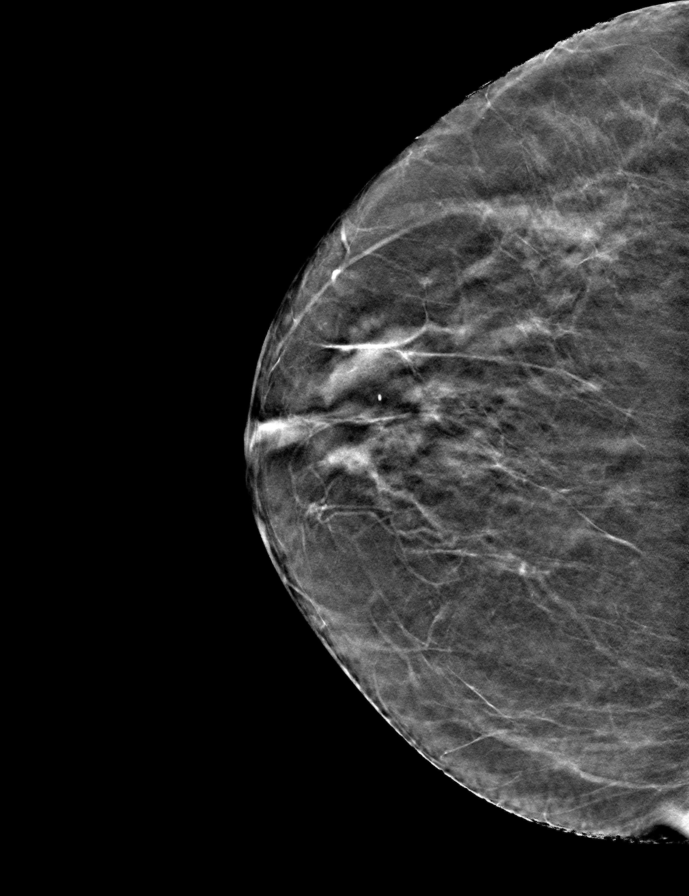

[L MLO tomo · tomo slice 31/60.0]
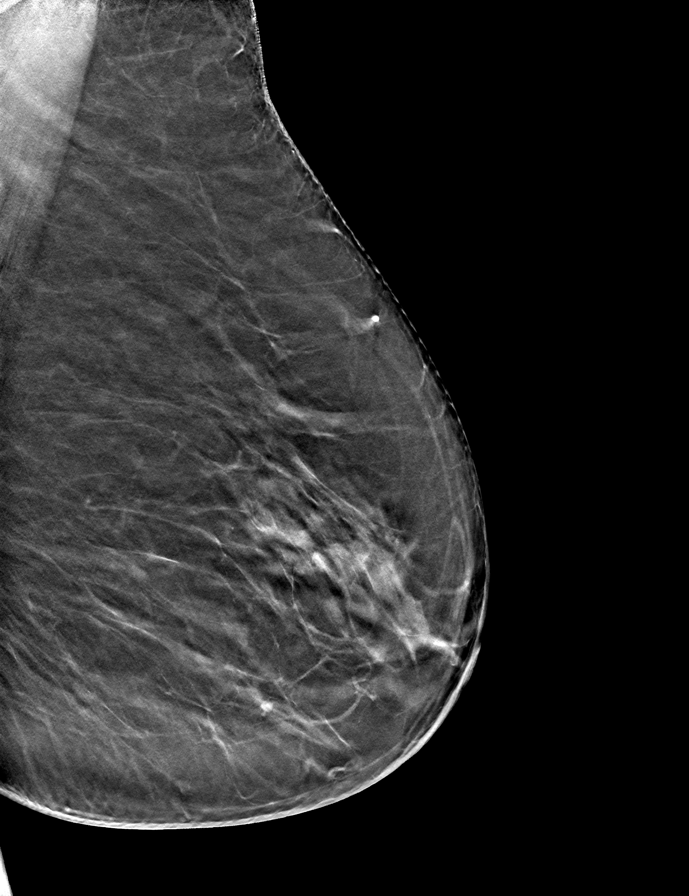

[R MLO tomo · tomo slice 33/66.0]
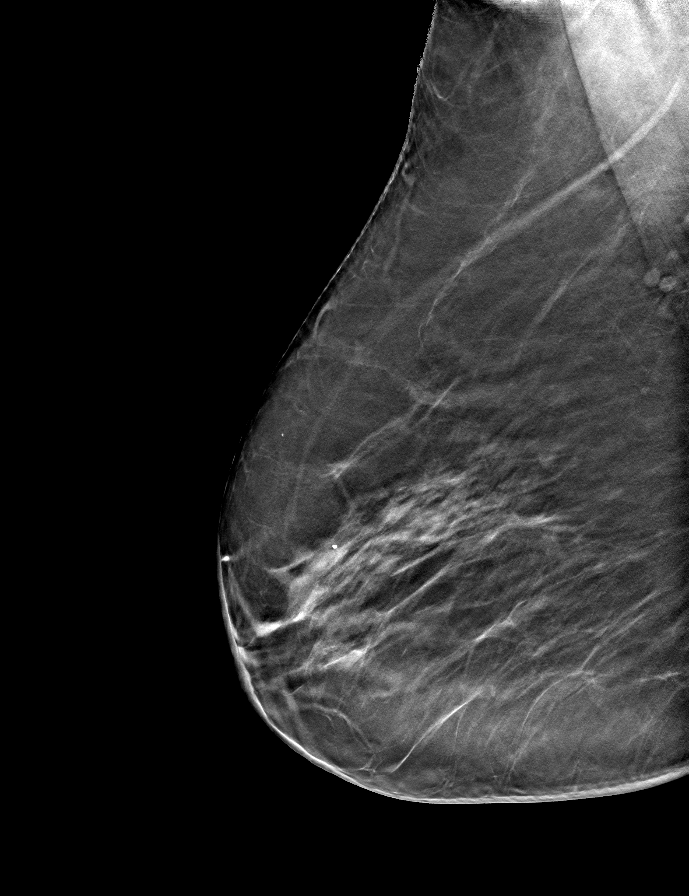

[9 of 24 positions shown; findings below may reference images not displayed]

FINDINGS: Scattered benign-appearing micro and macrocalcification. No 
mammographically suspicious abnormality and no significant change.
IMPRESSION: ( BI-RADS 2) Benign findings. Routine mammographic follow-up is recommended.

## 2019-05-27 IMAGING — DX FOOT 3 VIEWS RIGHT
1 series · 3 of 3 positions shown · non-contrast
Comparison: none

ANKLE 3 VIEWS RIGHT, FOOT 3 VIEWS RIGHT, 05/27/2019 [DATE]: 
CLINICAL INDICATION:  Right foot and ankle pain and swelling for about 6 months. 
COMPARISON EXAMINATIONS: None

[Series 1: AP · U · 0.12mm/px · 3 of 3 slices shown]
[im 1/3]
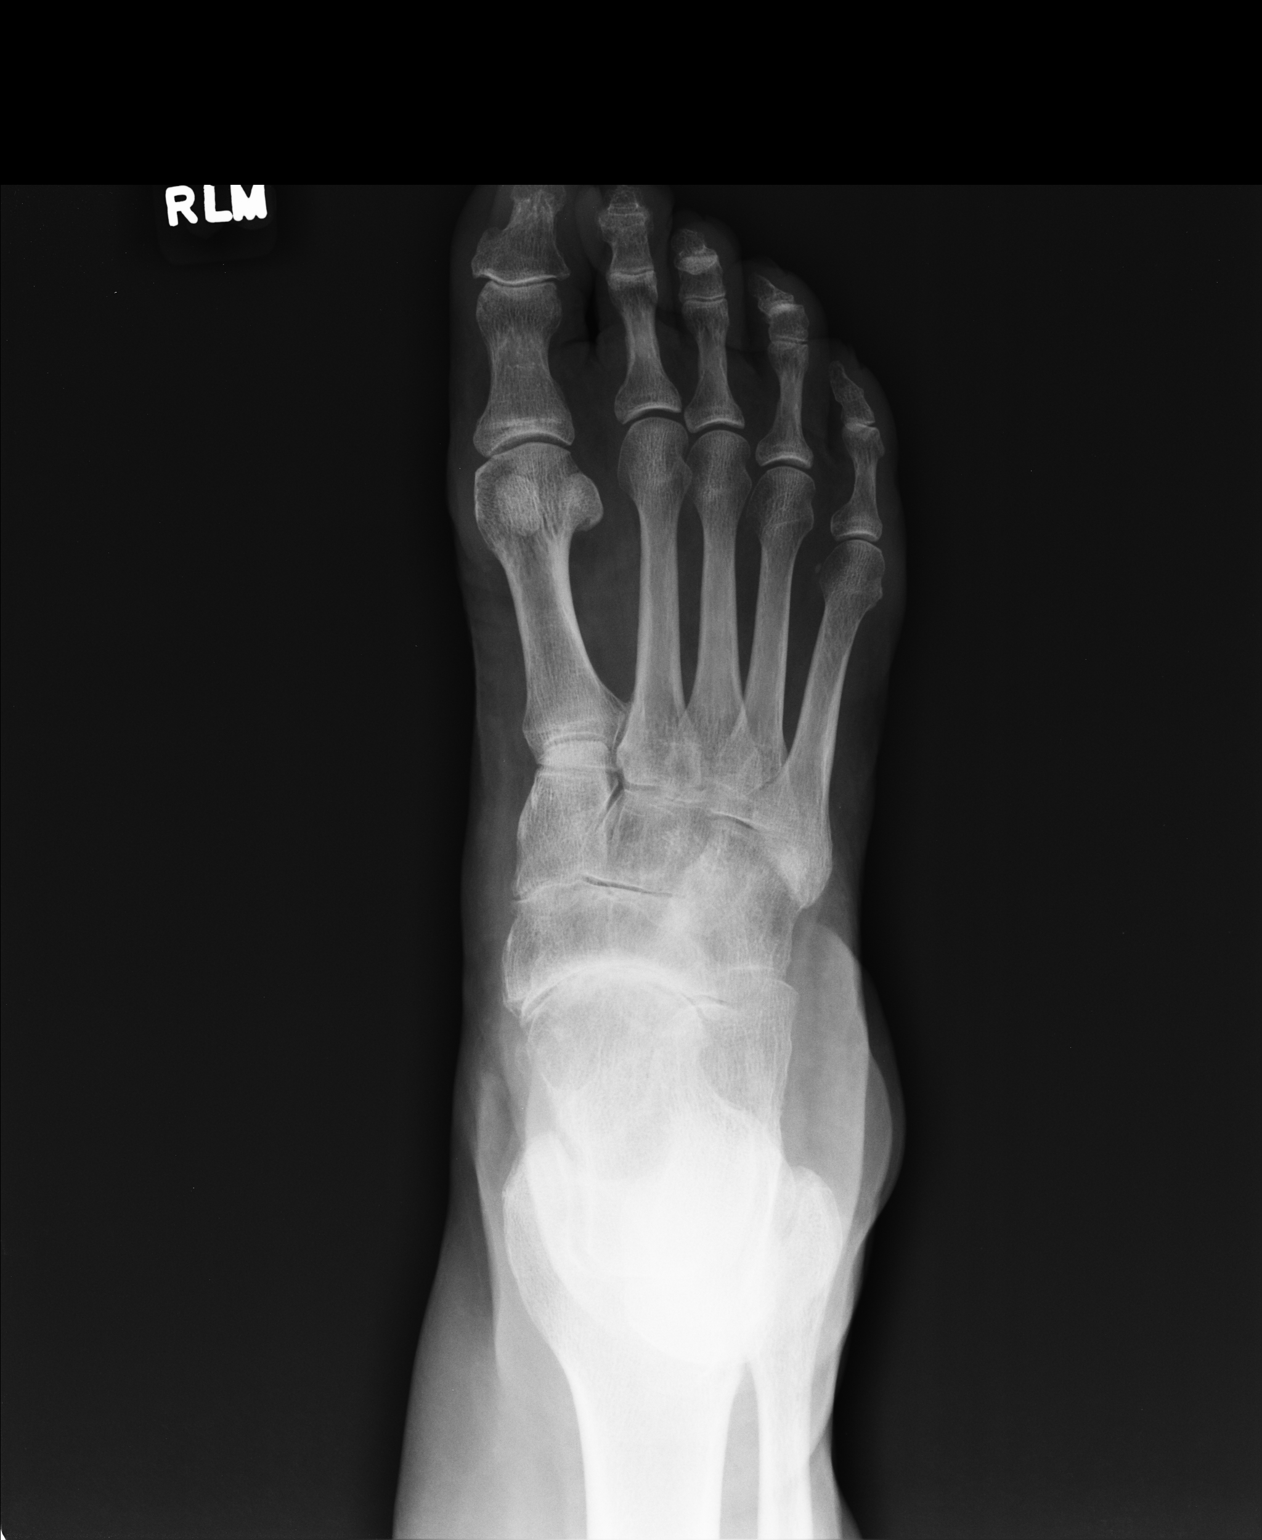
[im 2/3]
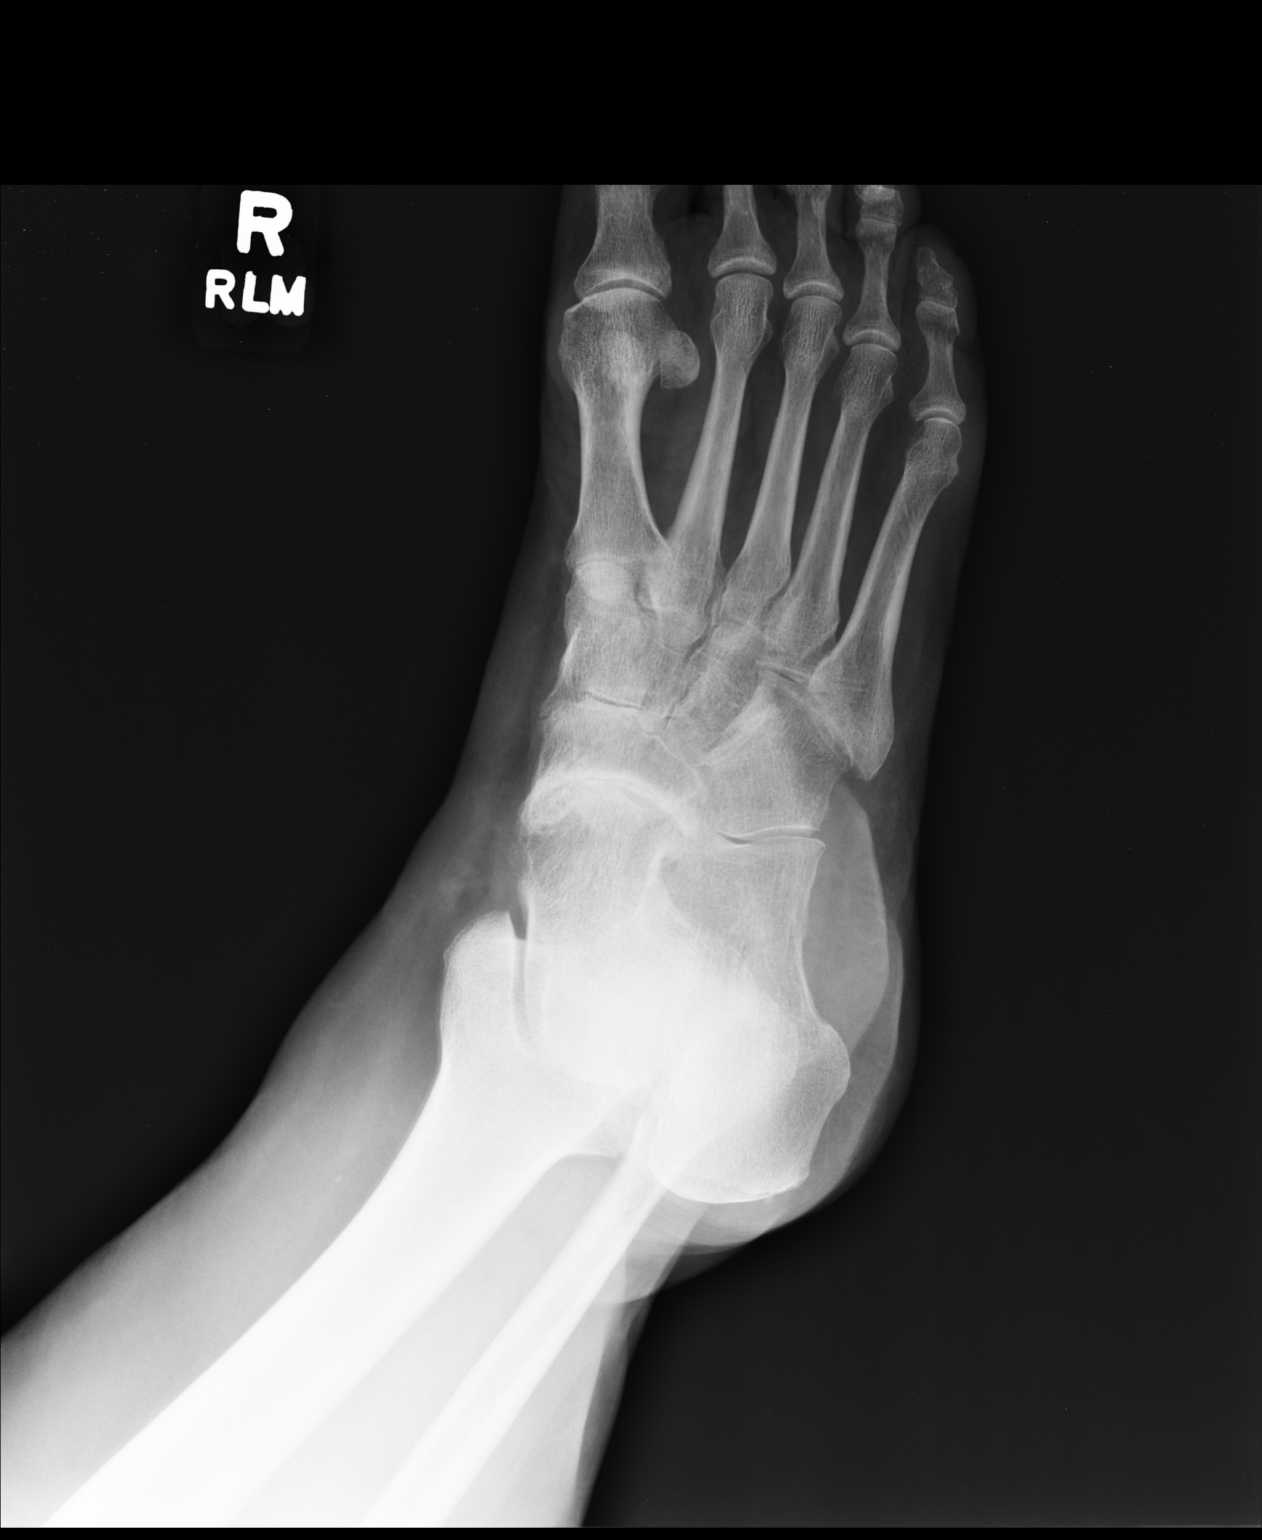
[im 3/3]
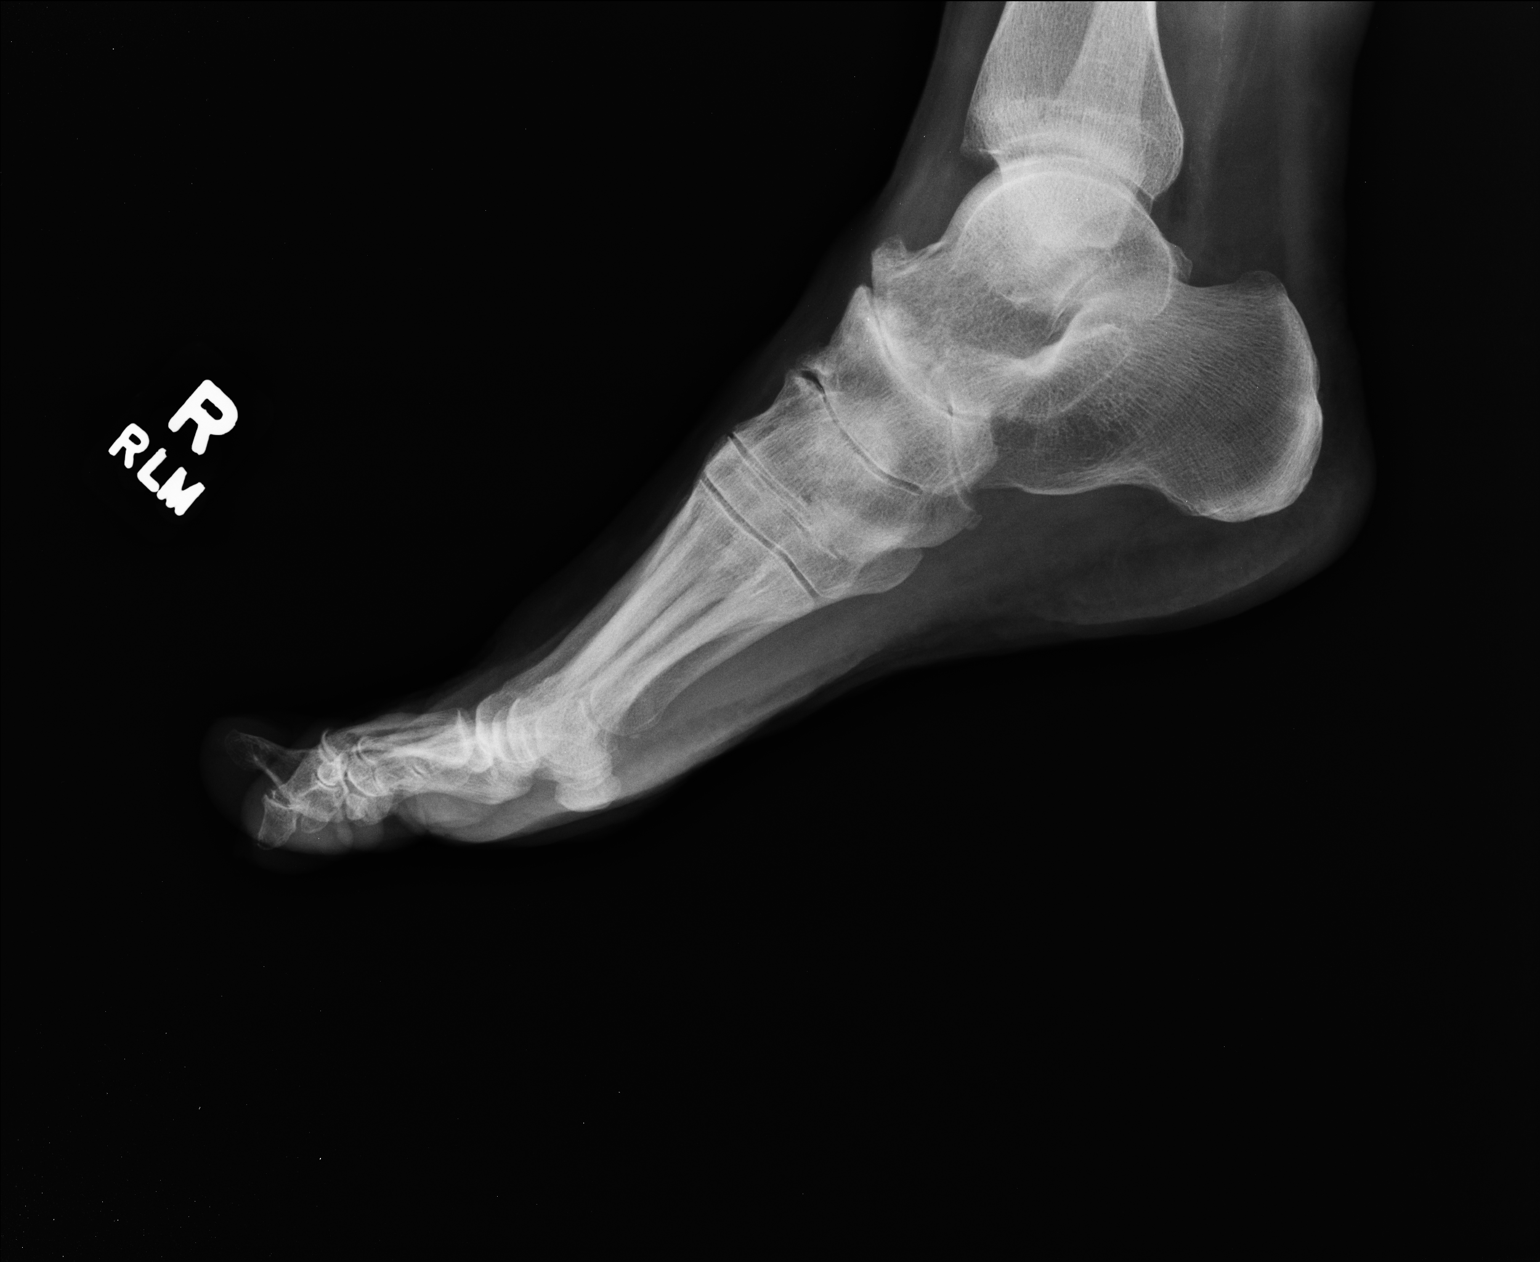

[3 of 3 positions shown; findings below may reference images not displayed]

FINDINGS: Marked degenerative change of the talonavicular and 
navicular-medial/middle cuneiform articulations. Mild degenerative change of the 
hallux metatarsophalangeal (MTP) joint. Remaining joints are preserved. Tiny 
posterior calcaneal enthesophyte. Mild soft tissue swelling.
IMPRESSION: Multifocal degenerative change, most marked involving the talonavicular and 
navicular-medial/middle cuneiform articulations.

## 2019-05-27 IMAGING — DX KNEE 3 VIEWS LEFT
1 series · 3 of 3 positions shown · non-contrast
Comparison: none

KNEE 3 VIEWS LEFT, 05/27/2019 [DATE]: 
CLINICAL INDICATION:  Left knee pain and swelling for one year. 
COMPARISON EXAMINATIONS: None

[Series 1: pa axial · U · 0.12mm/px · 3 of 3 slices shown]
[im 1/3]
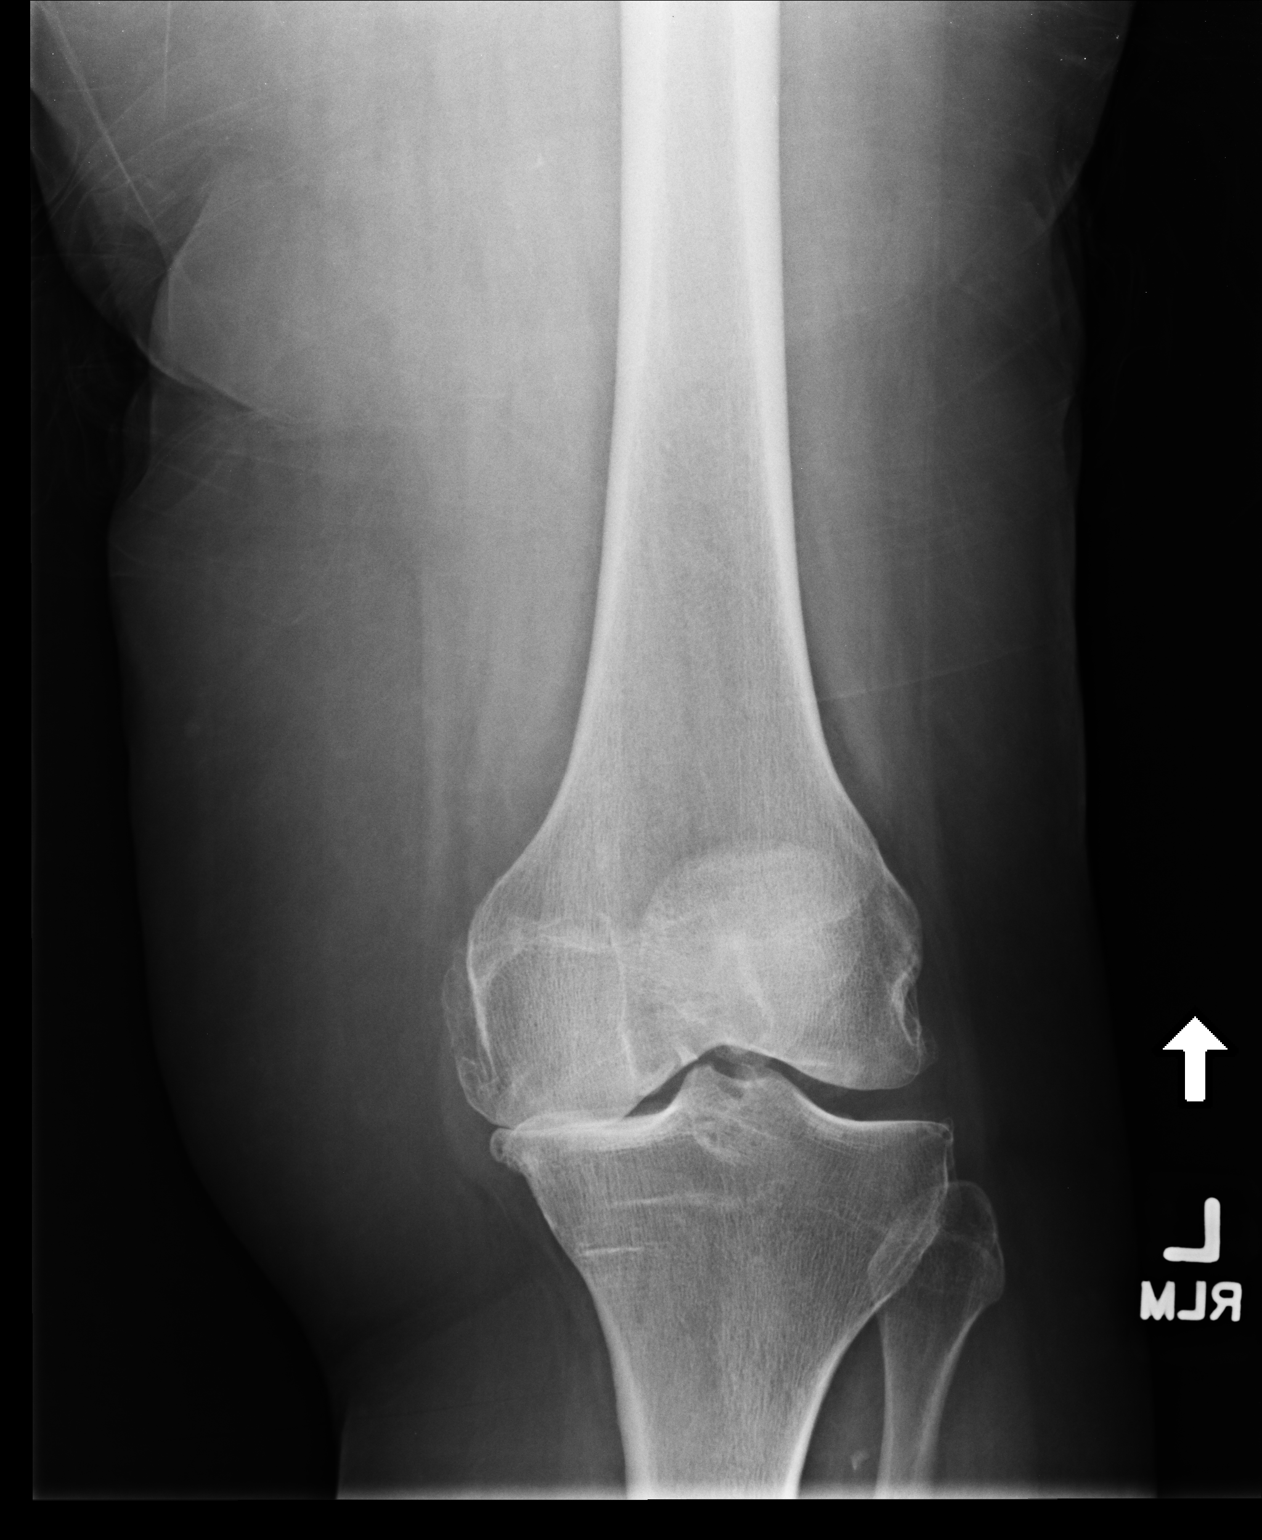
[im 2/3]
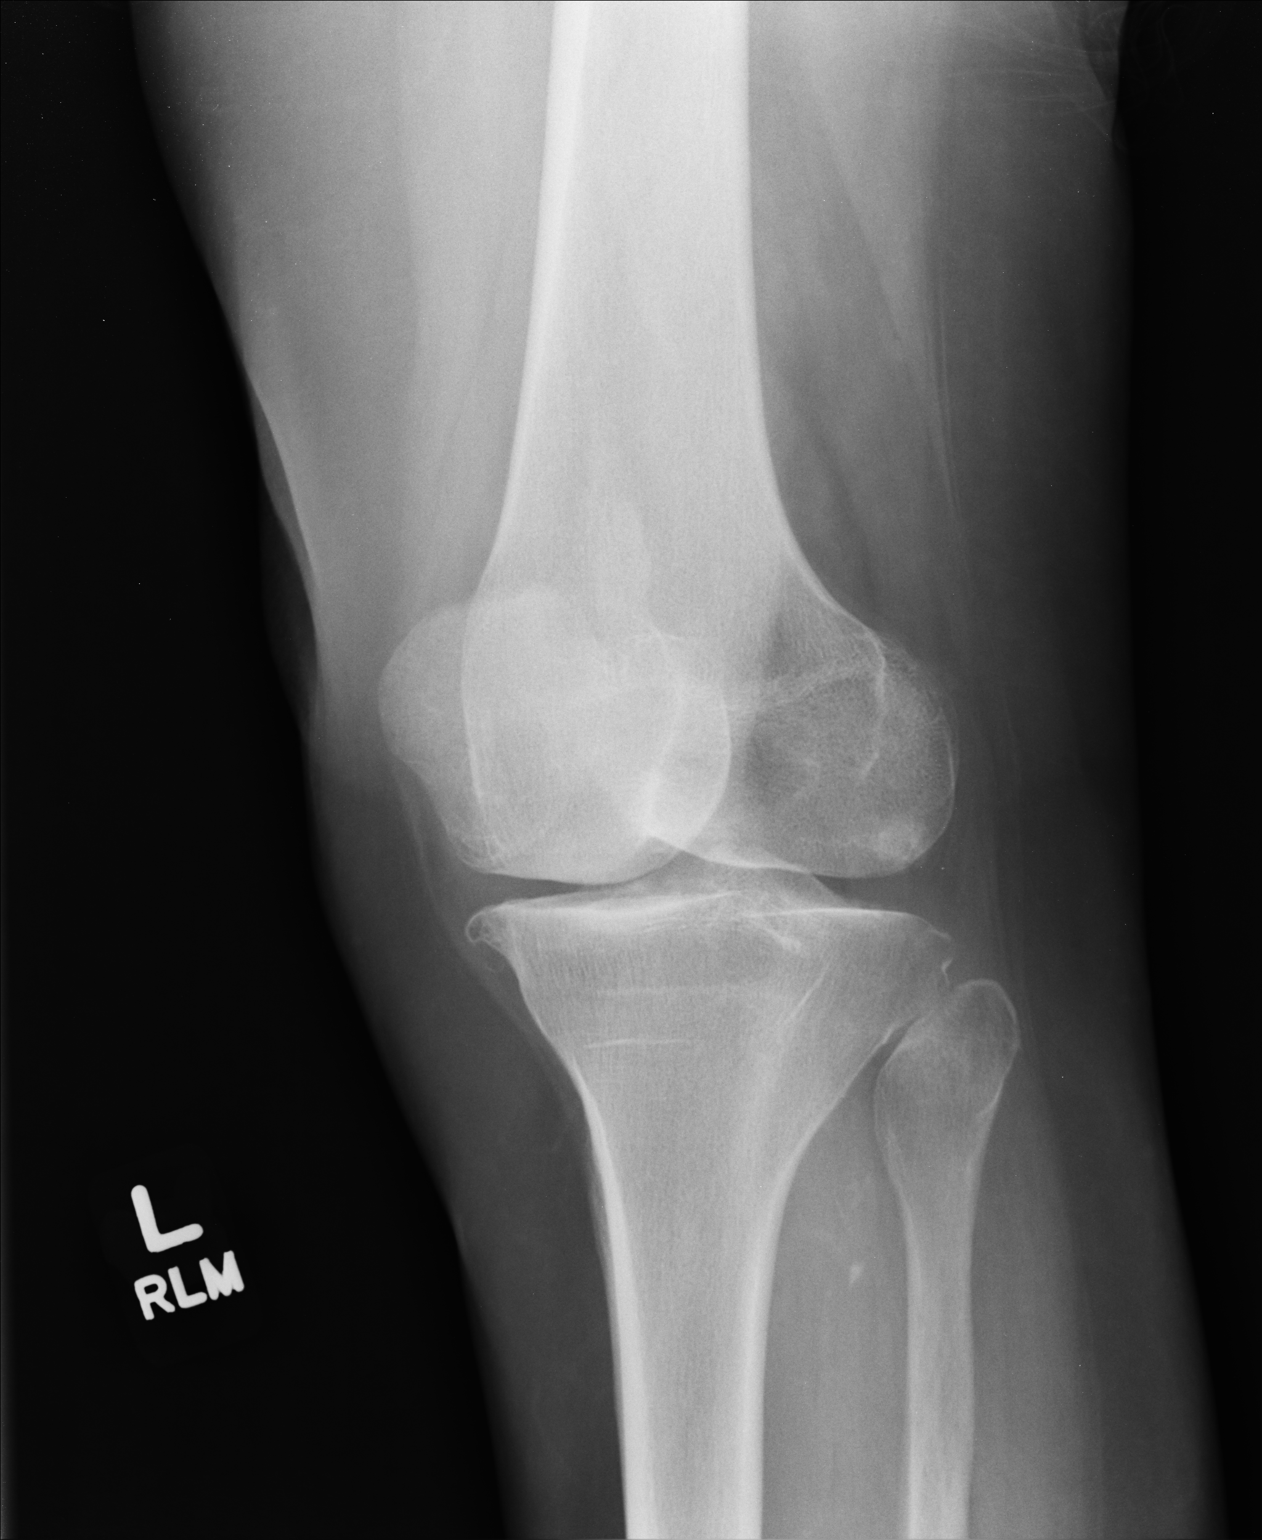
[im 3/3]
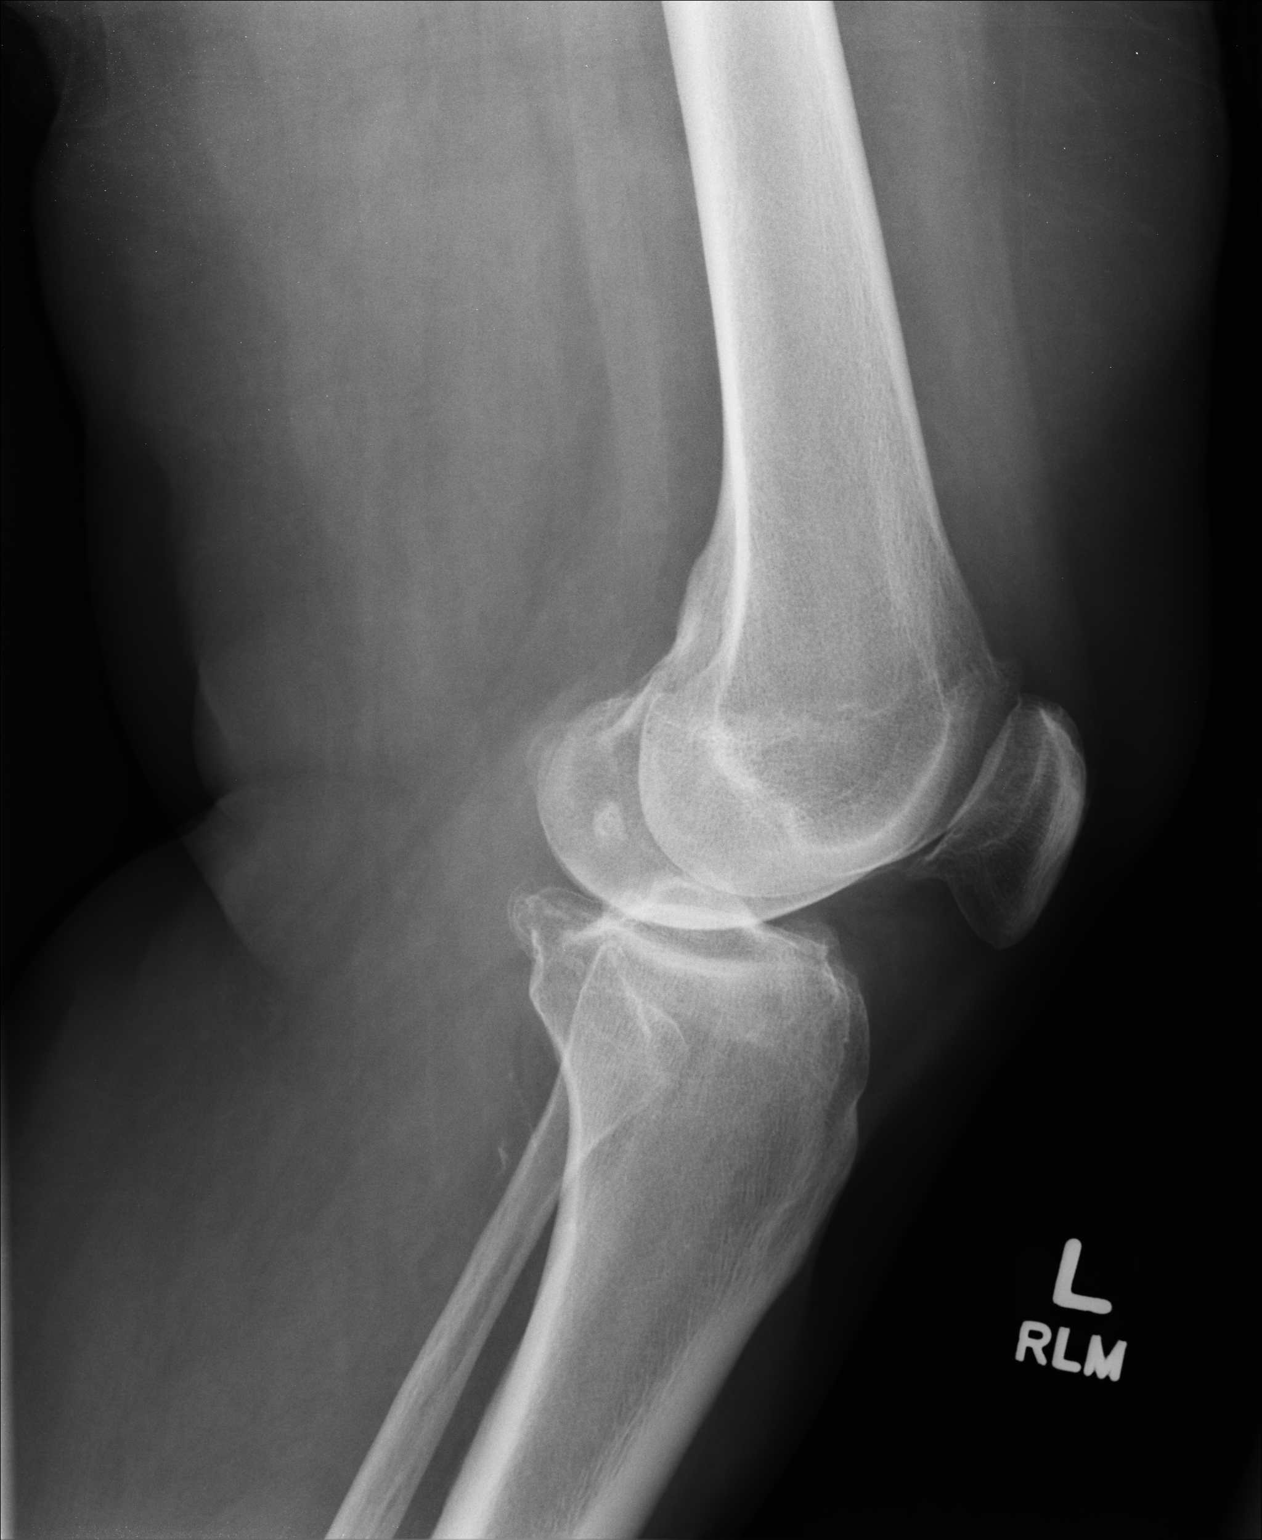

[3 of 3 positions shown; findings below may reference images not displayed]

FINDINGS: No fracture. Marked medial compartment joint space narrowing and genu 
varum. Moderate patellofemoral and mild lateral compartment degenerative change. 
Atherosclerosis.
IMPRESSION: Marked degenerative change.

## 2019-05-27 IMAGING — DX ANKLE 3 VIEWS RIGHT
1 series · 3 of 3 positions shown · non-contrast
Comparison: none

ANKLE 3 VIEWS RIGHT, FOOT 3 VIEWS RIGHT, 05/27/2019 [DATE]: 
CLINICAL INDICATION:  Right foot and ankle pain and swelling for about 6 months. 
COMPARISON EXAMINATIONS: None

[Series 1: AP · U · 0.10mm/px · 3 of 3 slices shown]
[im 1/3]
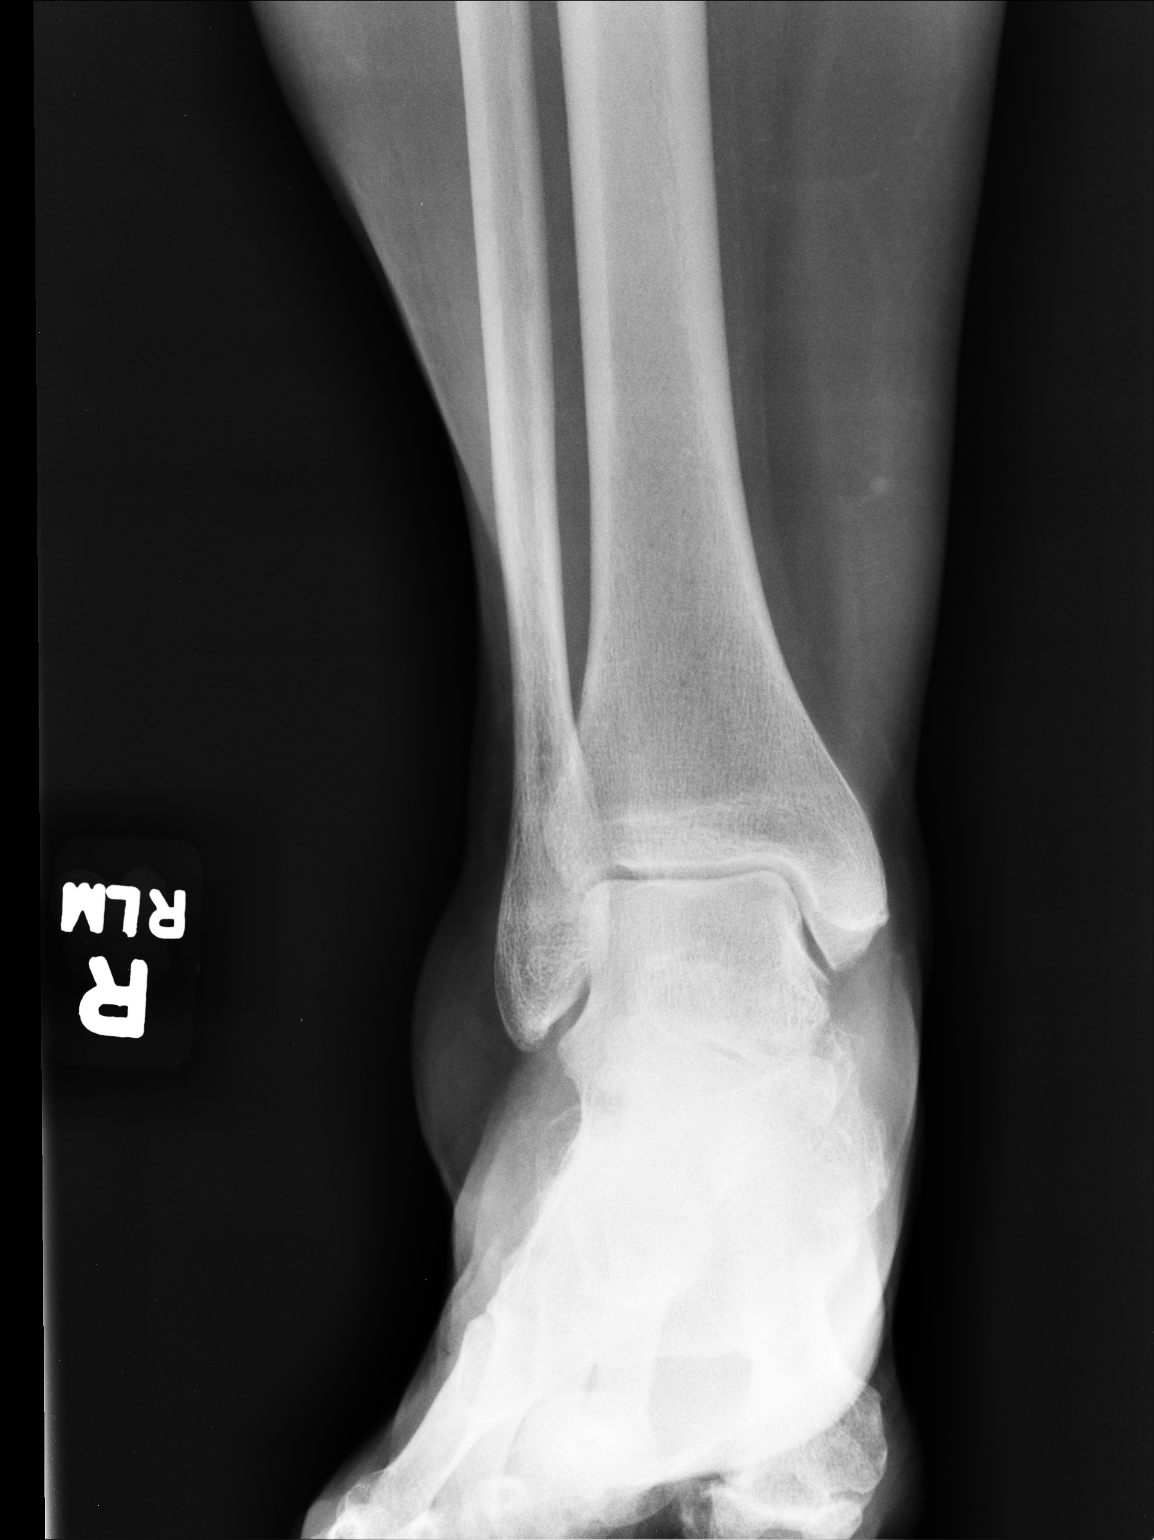
[im 2/3]
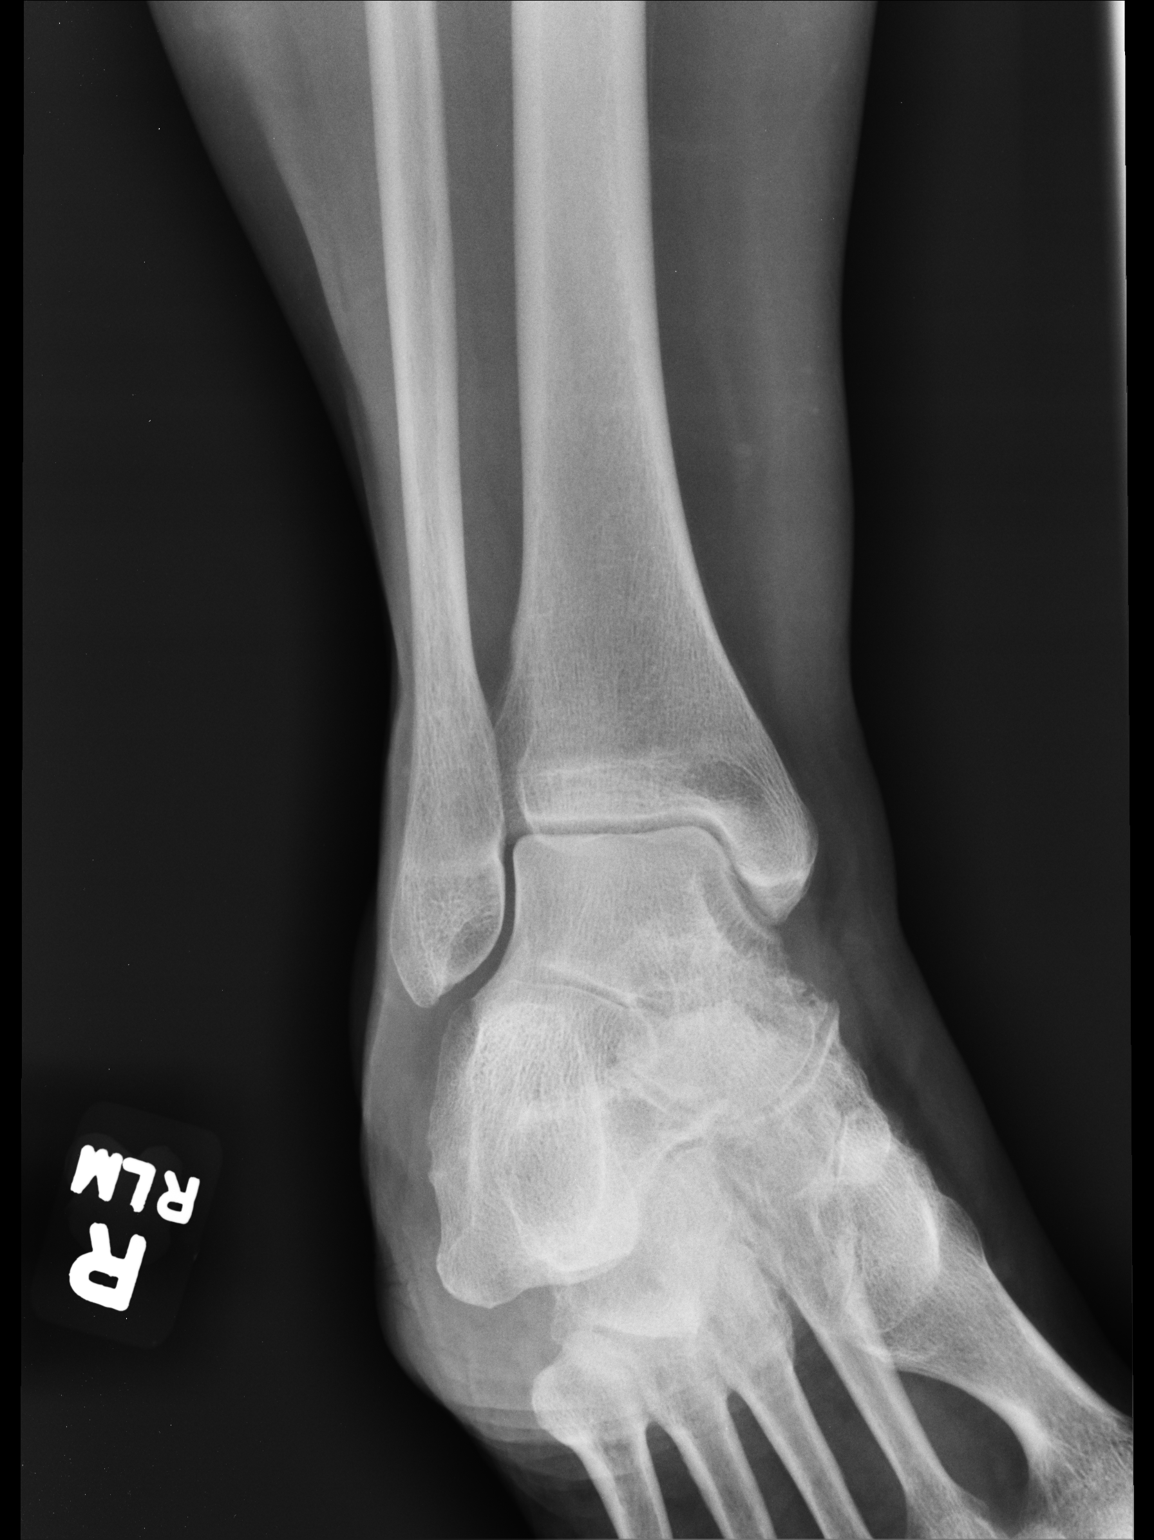
[im 3/3]
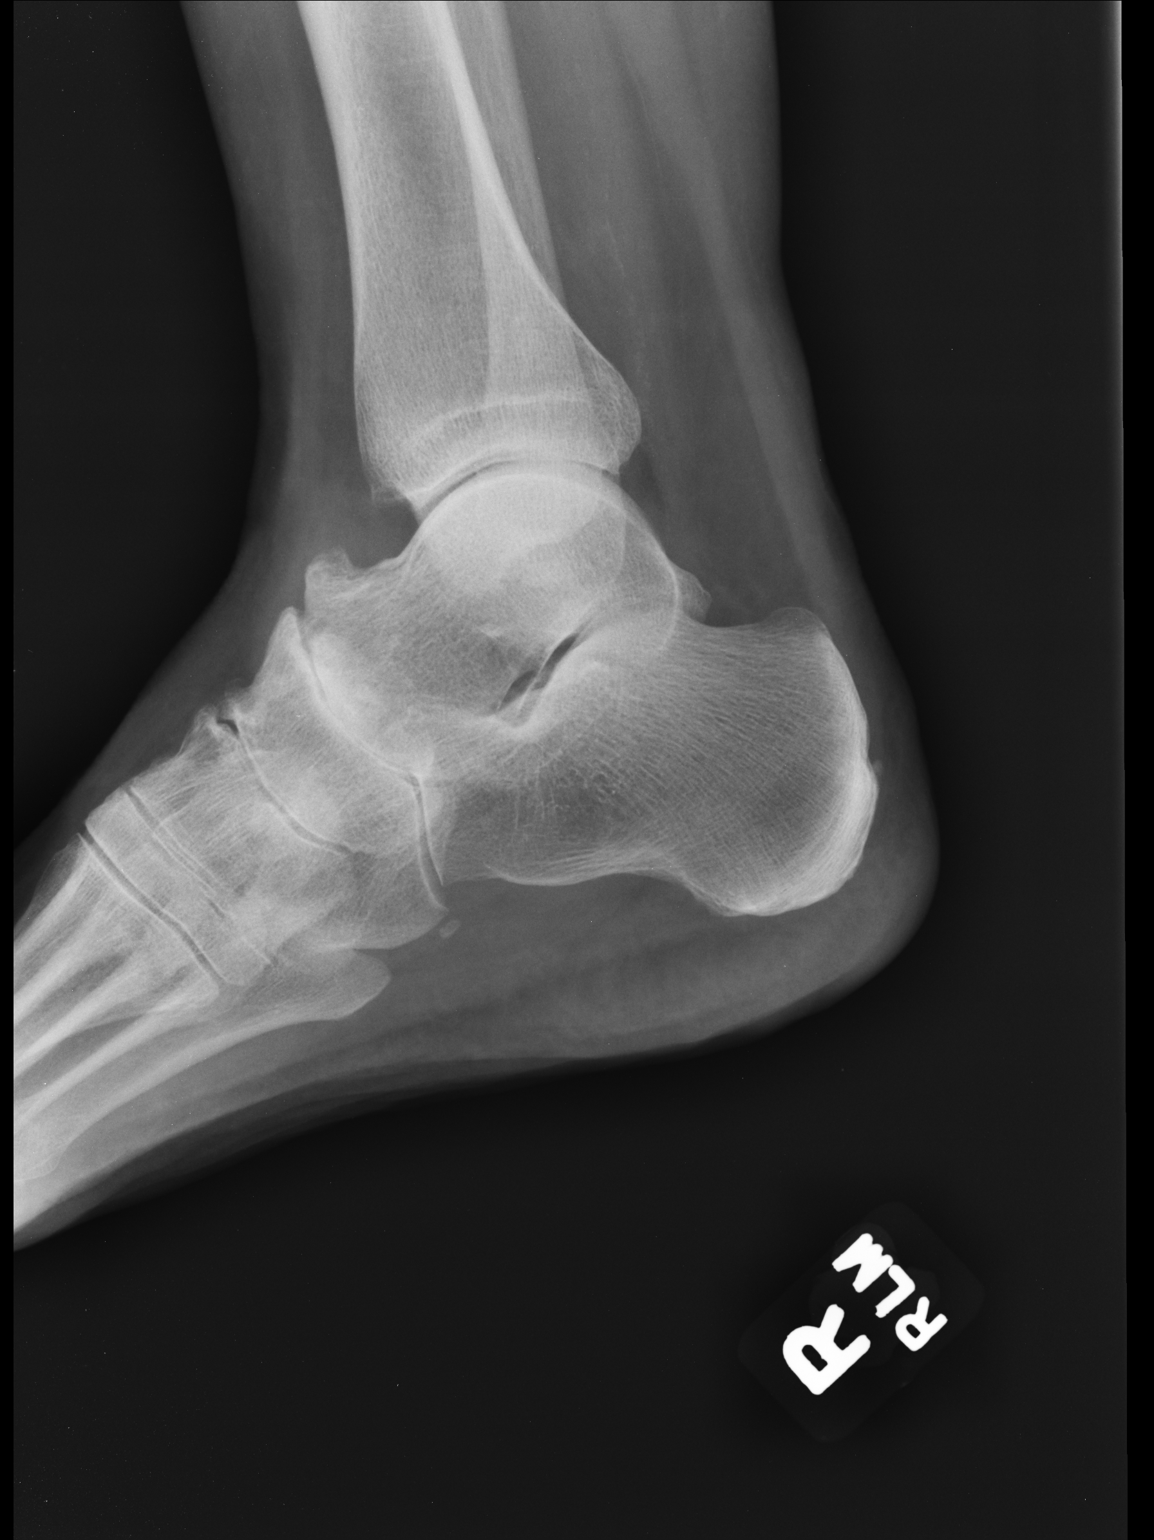

[3 of 3 positions shown; findings below may reference images not displayed]

FINDINGS: Marked degenerative change of the talonavicular and 
navicular-medial/middle cuneiform articulations. Mild degenerative change of the 
hallux metatarsophalangeal (MTP) joint. Remaining joints are preserved. Tiny 
posterior calcaneal enthesophyte. Mild soft tissue swelling.
IMPRESSION: Multifocal degenerative change, most marked involving the talonavicular and 
navicular-medial/middle cuneiform articulations.

## 2019-08-12 IMAGING — DX CHEST PA AND LATERAL
1 series · 2 of 2 positions shown · non-contrast
Comparison: 1977 exam

CLINICAL INDICATION:  Heartburn. Occasional shortness of breath.

[Series 1: PA · U · 0.17mm/px · 2 of 2 slices shown]
[im 1/2]
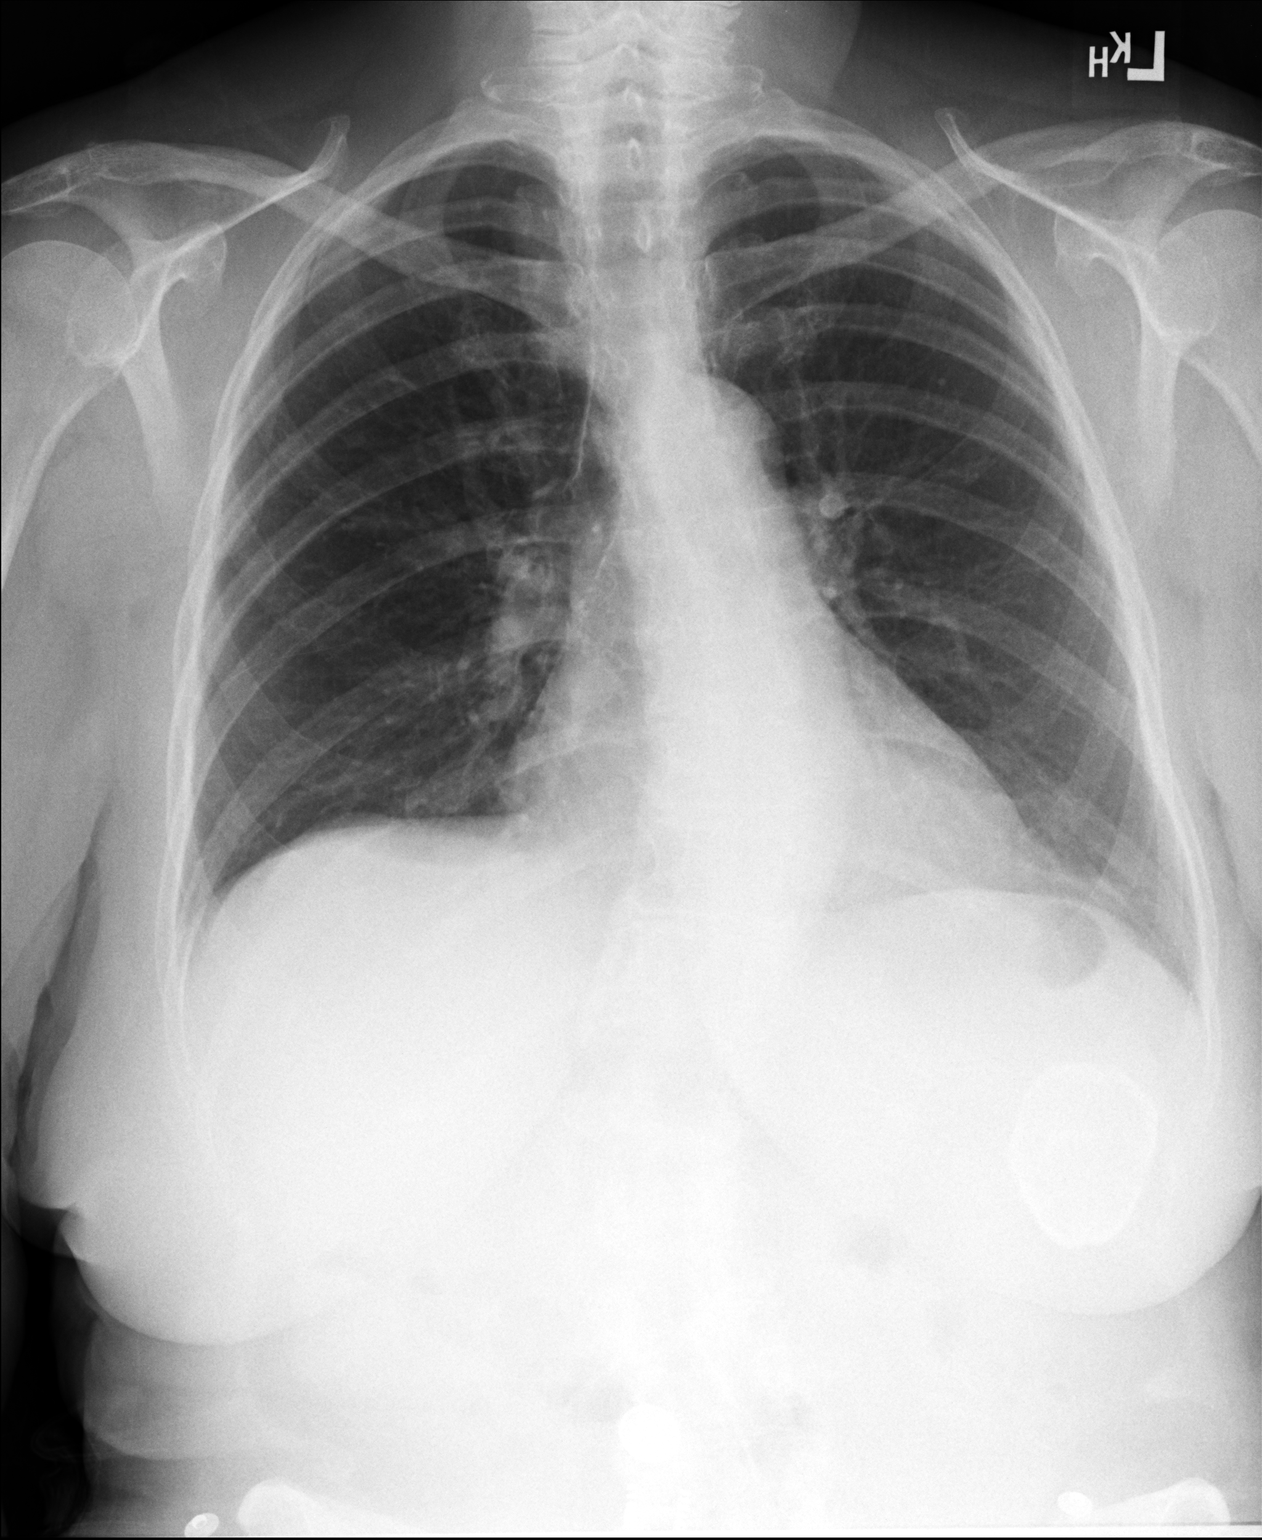
[im 2/2]
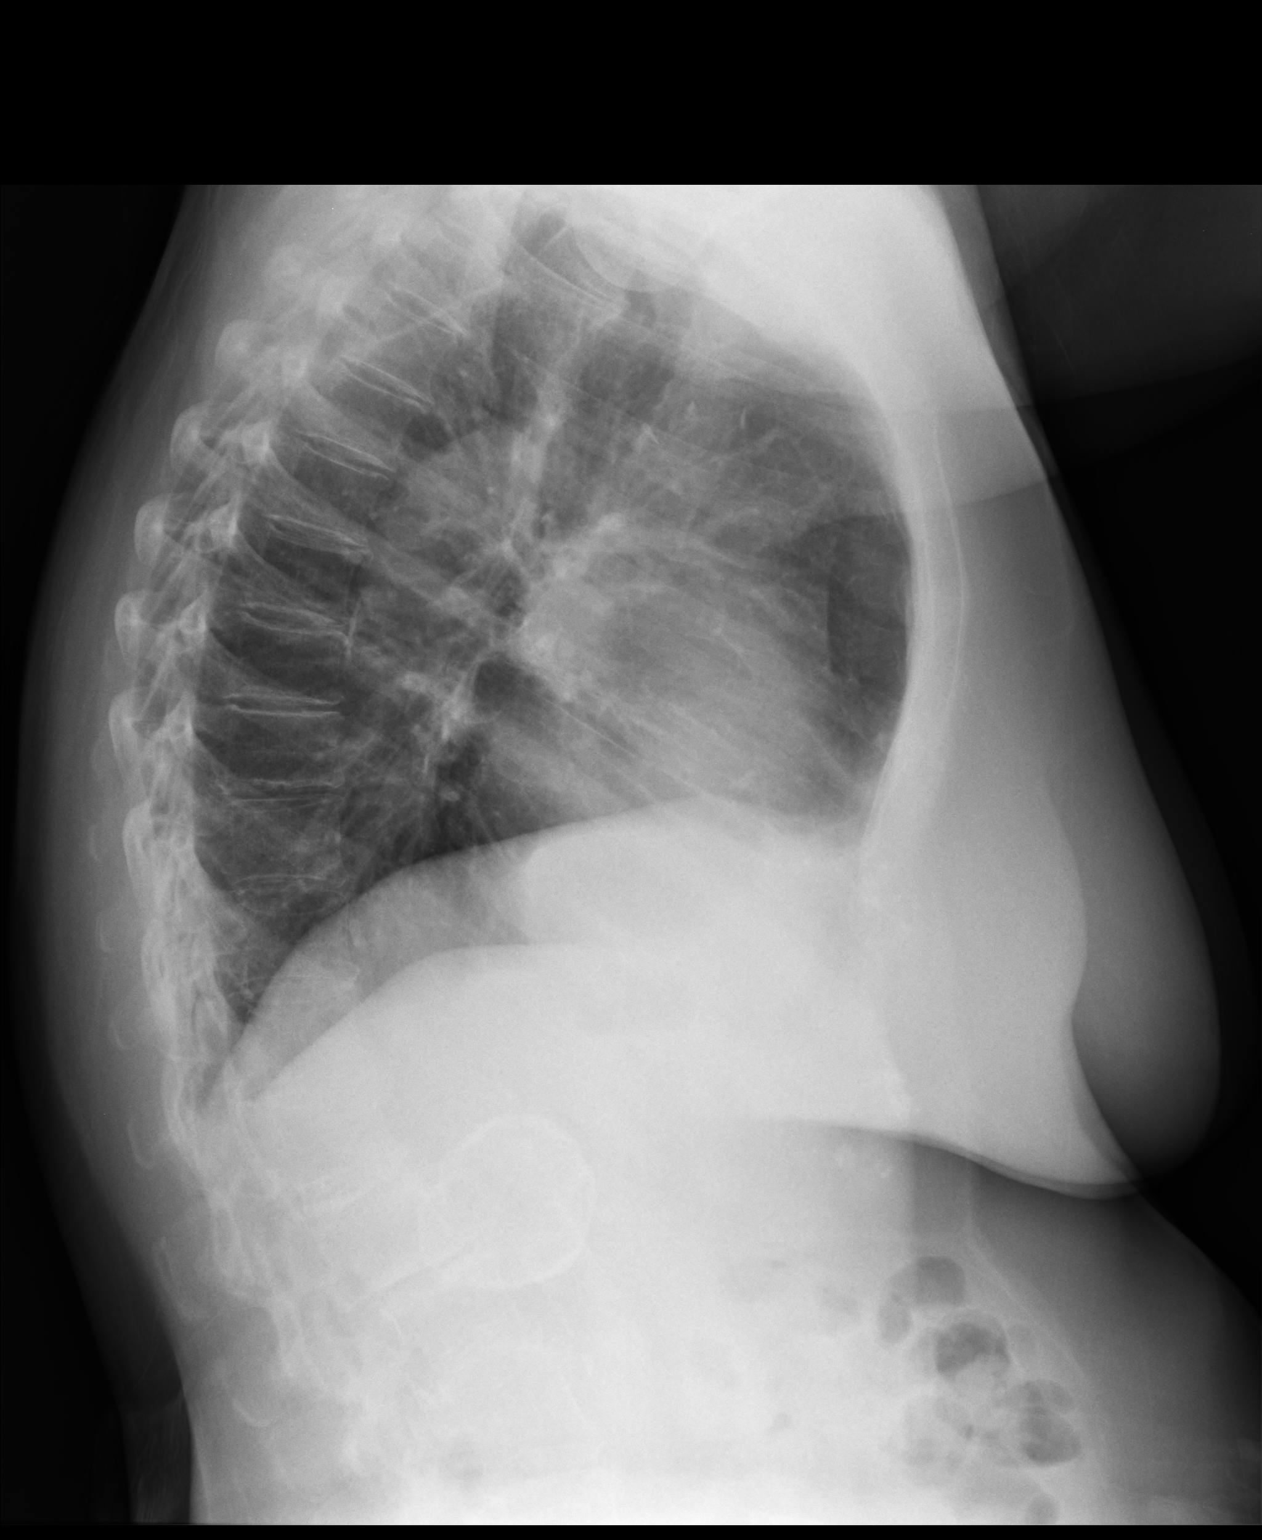

[2 of 2 positions shown; findings below may reference images not displayed]

FINDINGS: Stable large calcification within the left upper quadrant of the 
abdomen. Scoliosis. No new mass or consolidation. No pleural effusion seen.
IMPRESSION: No radiographic evidence of acute cardiopulmonary disease.

## 2020-05-24 IMAGING — CT CT ABDOMEN PELVIS W/ UROGRAM
2 of 6 series · 14 of 46 positions shown, 16 images · IV contrast (APPLIED)
Comparison: Abdominal ultrasound 05/27/2019, CT 01/06/2019

CT ABDOMEN PELVIS W/ UROGRAM, 05/24/2020 [DATE]: 
CLINICAL INDICATION: Microscopic hematuria, history of UTIs, frequency, 
incomplete emptying sensation, urgency 
A search for DICOM formatted images was conducted for prior CT imaging studies 
completed at a non-affiliated media free facility.
TECHNIQUE: The region of interest was scanned with and without contrast on a 
high resolution low dose CT scanner. 100 cc's of Isovue 300 was injected 
intravenously.  Routine MPR and MIP 3D renderings were reconstructed on an 
independent workstation with concurrent physician supervision.

[Series 5: portal · axial · portal-venous · 0.94mm/px · z∈[+675,+1077]mm · 11 of 162 slices shown, 13 images]
[im 14/162  soft-tissue]
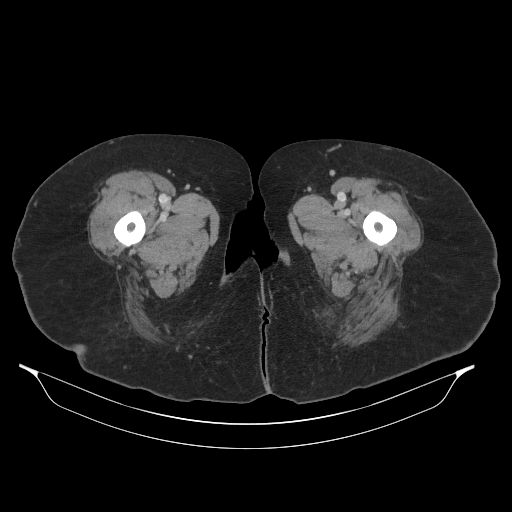
[im 14/162  bone]
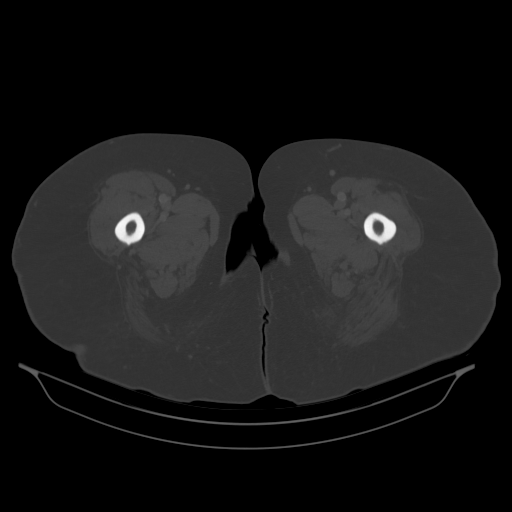
[im 27/162  soft-tissue]
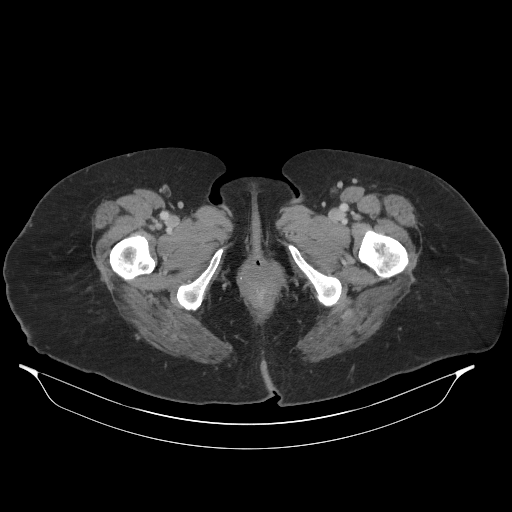
[im 41/162  soft-tissue]
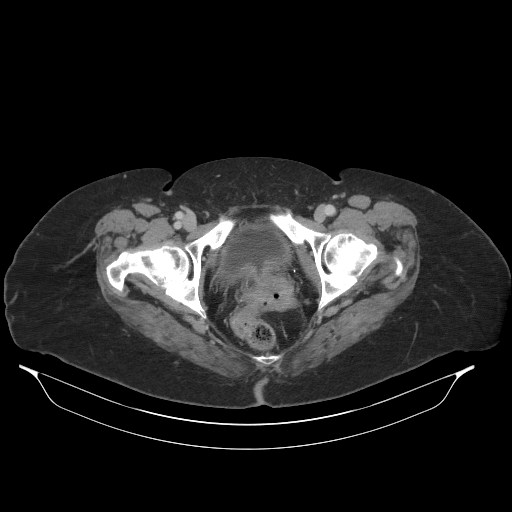
[im 54/162  soft-tissue]
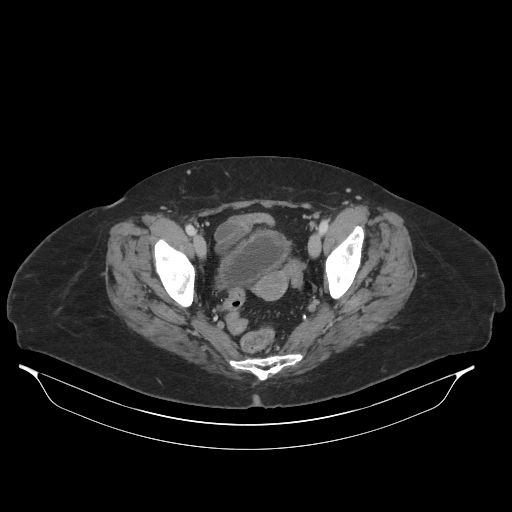
[im 68/162  soft-tissue]
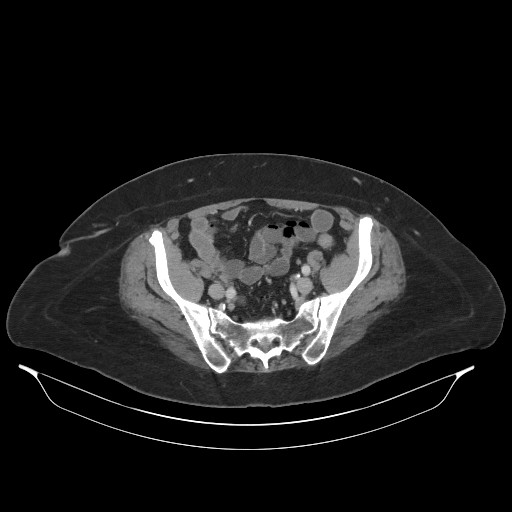
[im 81/162  soft-tissue]
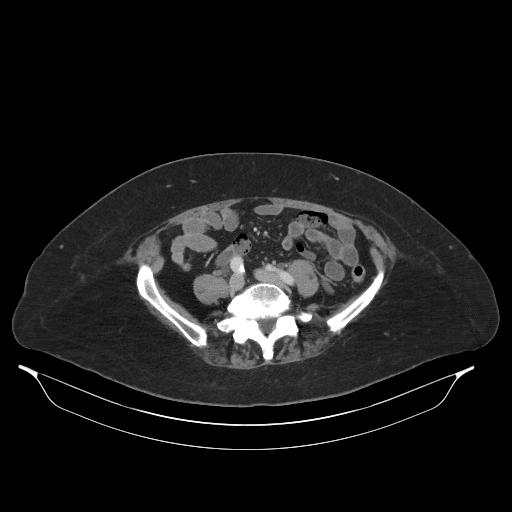
[im 94/162  soft-tissue]
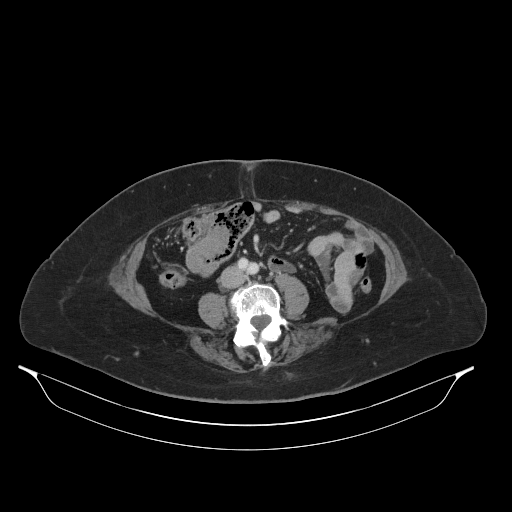
[im 108/162  soft-tissue]
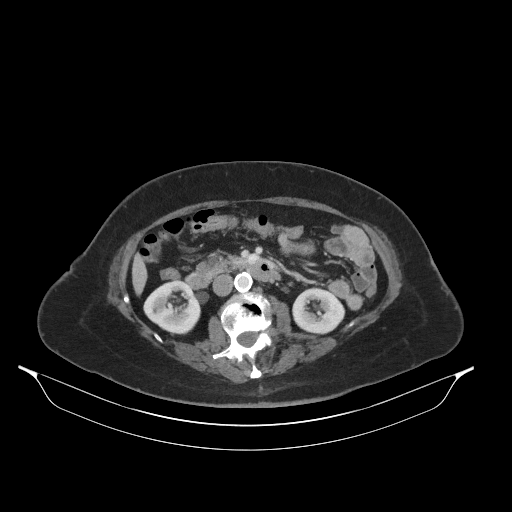
[im 121/162  soft-tissue]
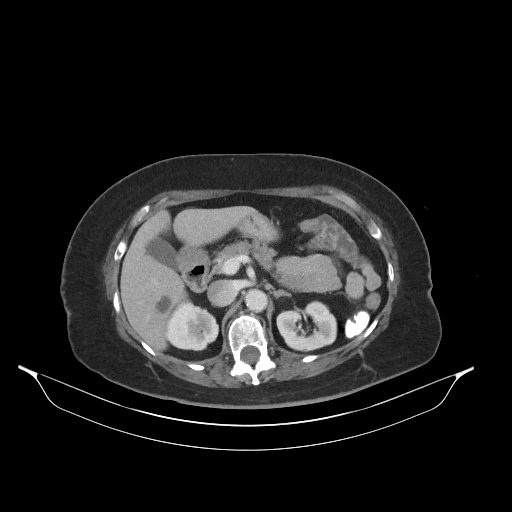
[im 121/162  bone]
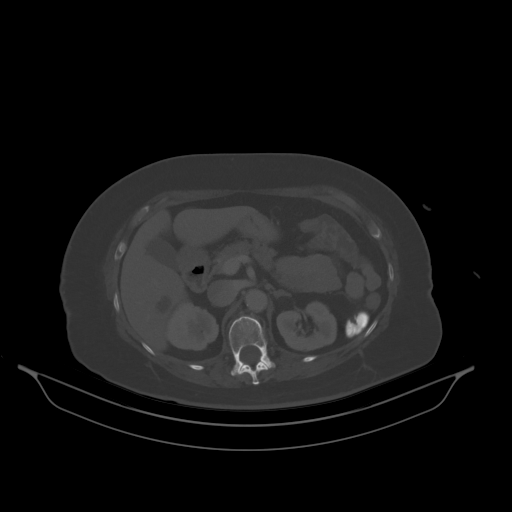
[im 135/162  soft-tissue]
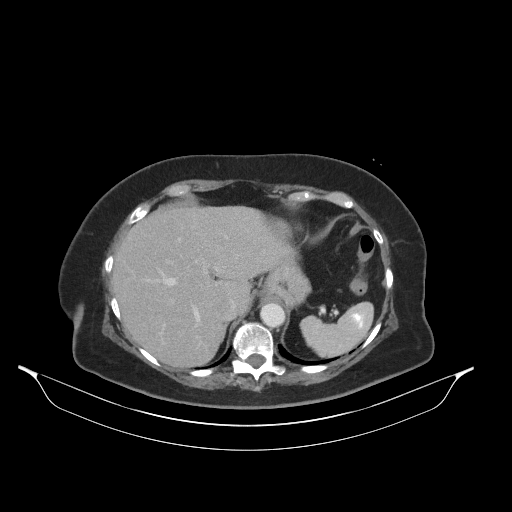
[im 148/162  soft-tissue]
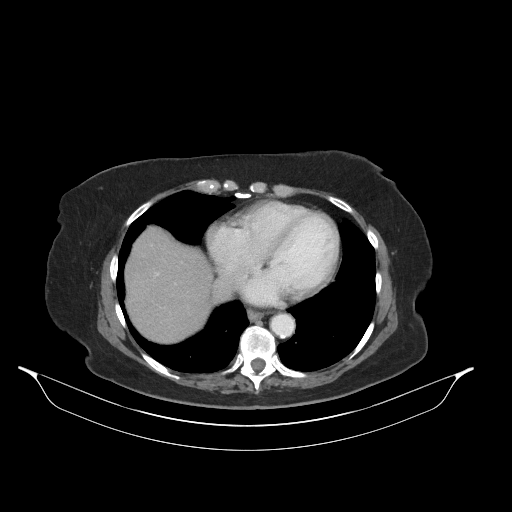

[Series 6: coronal · coronal · 0.92mm/px · 3 of 147 slices shown]
[im 37/147  soft-tissue]
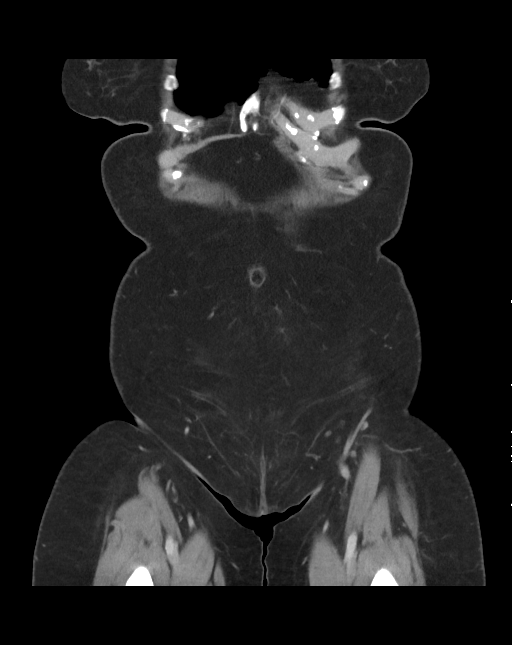
[im 74/147  soft-tissue]
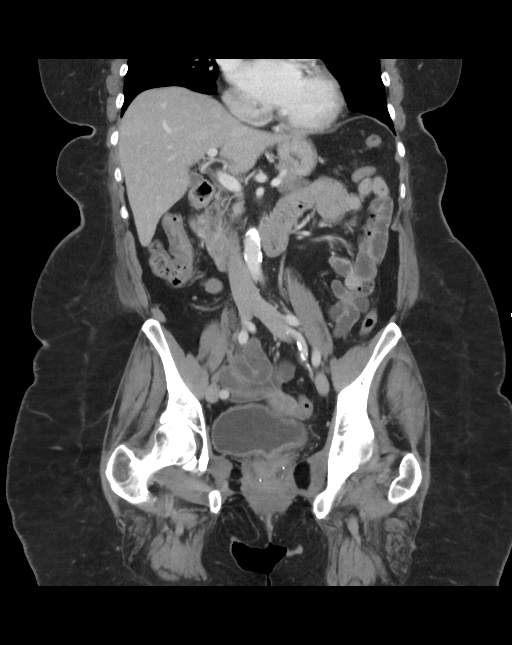
[im 110/147  soft-tissue]
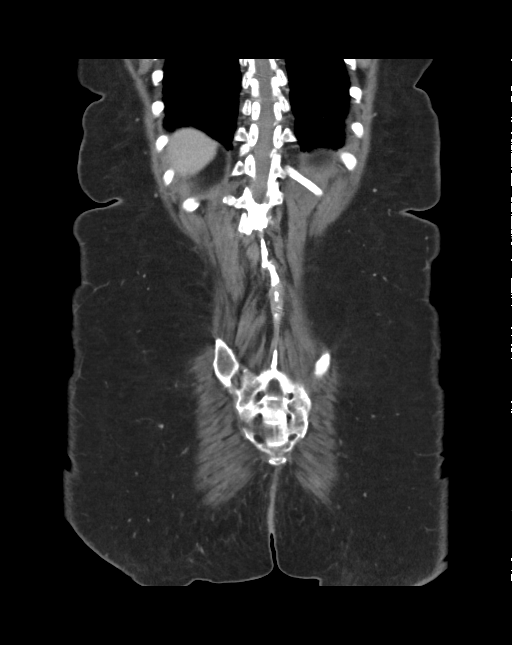

[14 of 46 positions shown; findings below may reference images not displayed]

FINDINGS: Urogram: No renal calculus. Right extrarenal pelvis. Small bilateral 
intracortical cysts. No enhancing mass. Moderately distended urinary bladder 
contains an air bubble. Mild urinary bladder serosal ill-defined margin. Normal 
contrast excretion and visualization of the ureters. No urothelial lesion or 
abnormal enhancement. 
Abdomen/pelvis: Mild scarring both lung bases. Mild cardiomegaly. Small hiatal 
hernia. Fatty liver with septated cyst posterior RIGHT lobe. Moderately 
distended gallbladder. Stable calcified splenic cyst. Normal appearance of the 
pancreas, bilateral adrenal glands. Atherosclerotic nonaneurysmal aorta. No 
retroperitoneal or mesenteric arcade adenopathy. Small periumbilical hernia 
contains fat. 
Decompressed stomach. Unremarkable small bowel. Colonic diverticulosis. No free 
fluid or free air. Anteverted uterus. Degenerative lumbar spondylosis with 
multilevel vacuum discs.
IMPRESSION: Air bubbles in the urinary bladder. Correlate for recent catheterization versus 
gas-forming organism/infection, similar to the 7575 exam. Findings suggest 
chronic cystitis. 
Normal urogram otherwise. 
Otherwise stable exam. 
RADIATION DOSE REDUCTION: All CT scans are performed using radiation dose 
reduction techniques, when applicable.  Technical factors are evaluated and 
adjusted to ensure appropriate moderation of exposure.  Automated dose 
management technology is applied to adjust the radiation doses to minimize 
exposure while achieving diagnostic quality images.

## 2021-05-23 IMAGING — MG MAMMOGRAPHY SCREENING BILATERAL 3[PERSON_NAME]
8 series · 8 of 24 positions shown · non-contrast
Comparison: Comparison was made to prior examinations.

FINAL Diagnostic Imaging Report 
________________________________________________________________________________________________ 
MAMMOGRAPHY SCREENING BILATERAL 3HARUKA ATILANO, 05/23/2021 [DATE]: 
CLINICAL INDICATION: Screening.
TECHNIQUE: Digital bilateral mammograms and 3-D Tomosynthesis were obtained. 
These were interpreted both primarily and with the aid of computer-aided 
detection system.  
BREAST DENSITY: (Level B) There are scattered areas of fibroglandular density.

[L MLO]
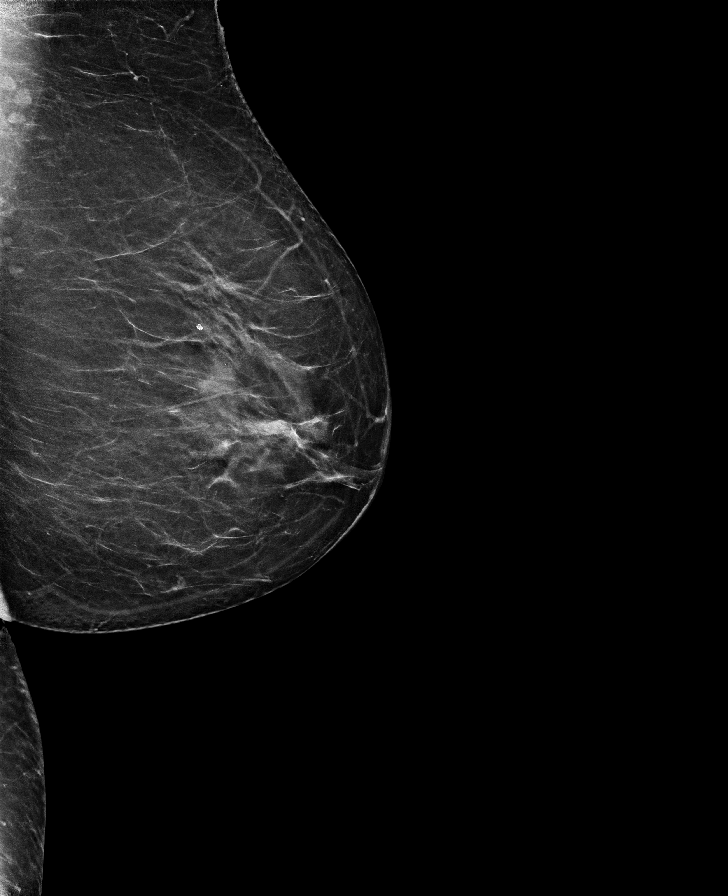

[L CC]
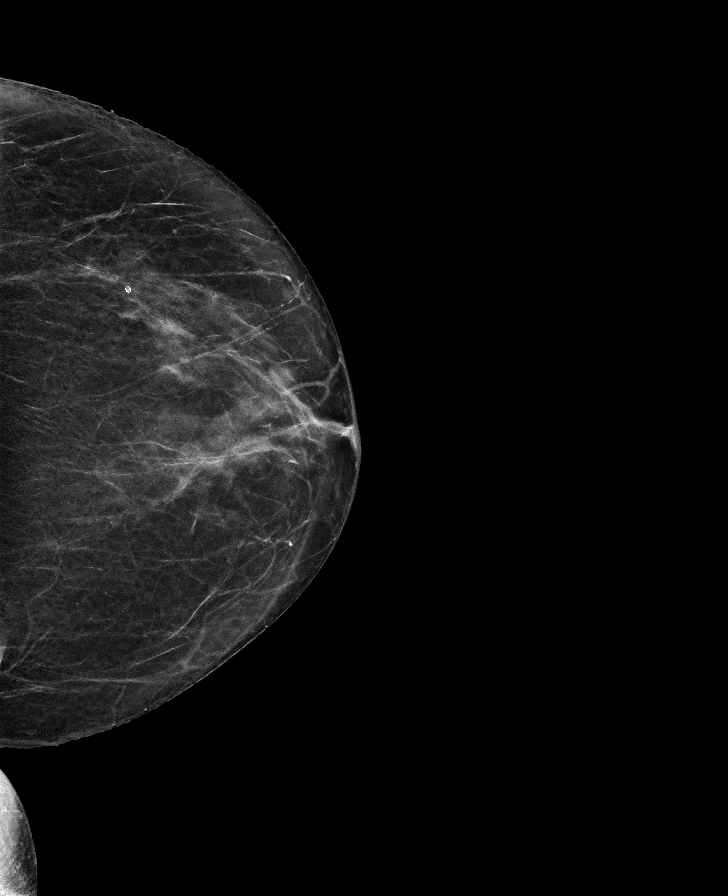

[R CC]
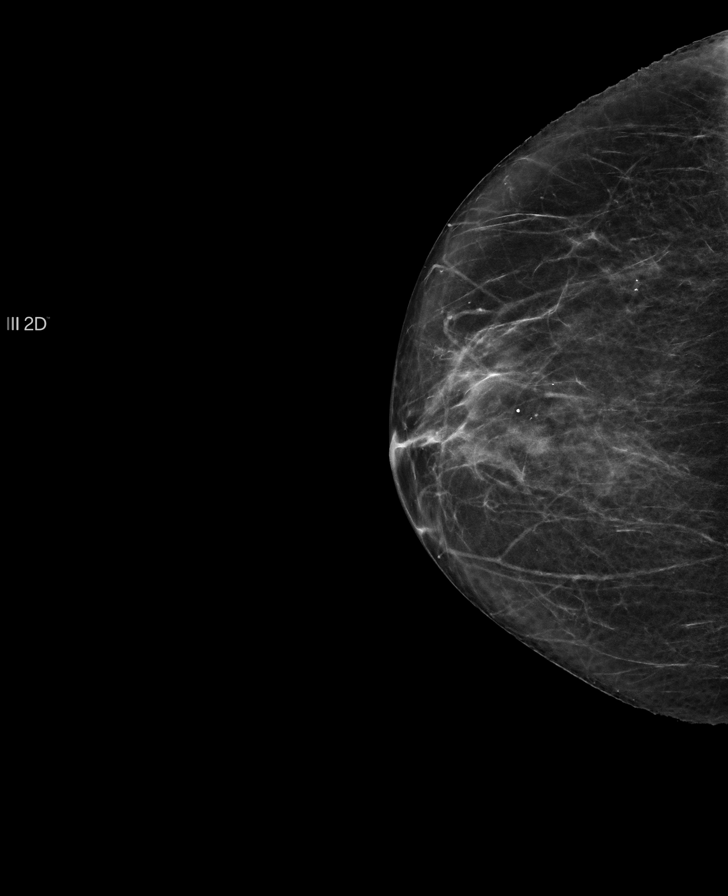

[R MLO]
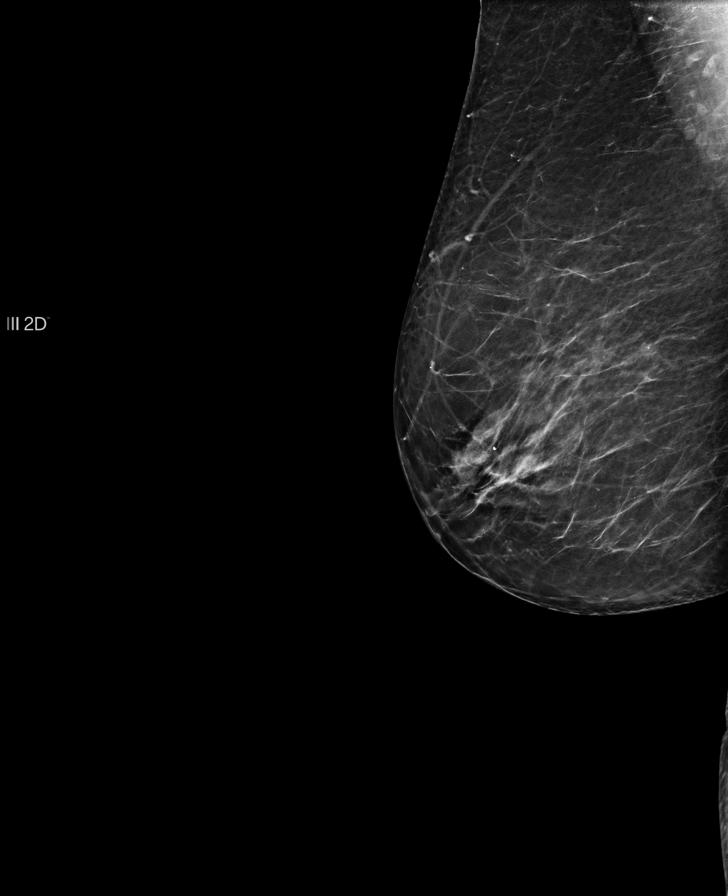

[L MLO tomo · tomo slice 34/67.0]
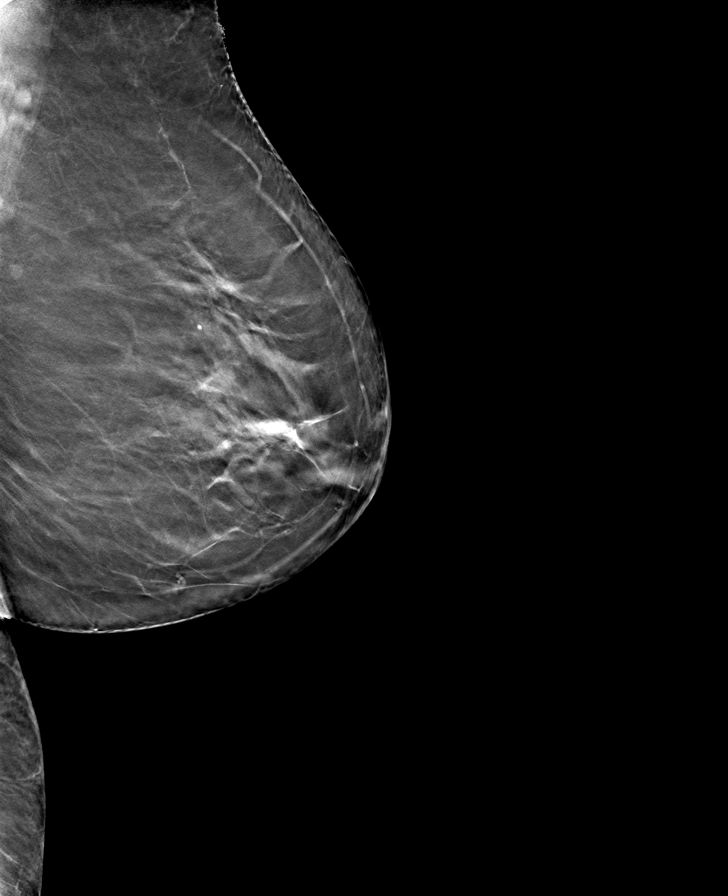

[R CC tomo · tomo slice 25/48.0]
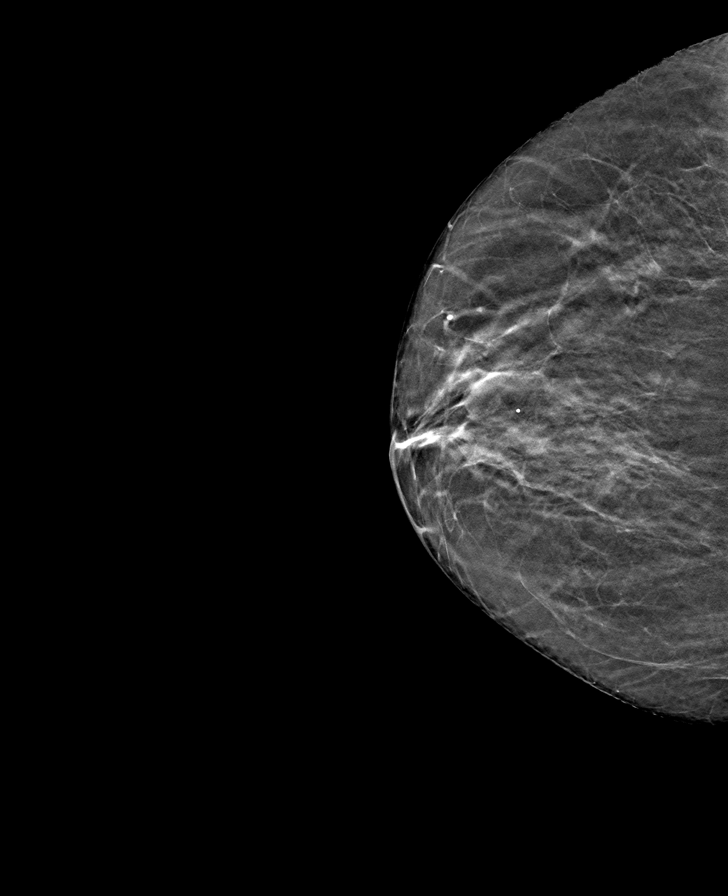

[L CC tomo · tomo slice 26/51.0]
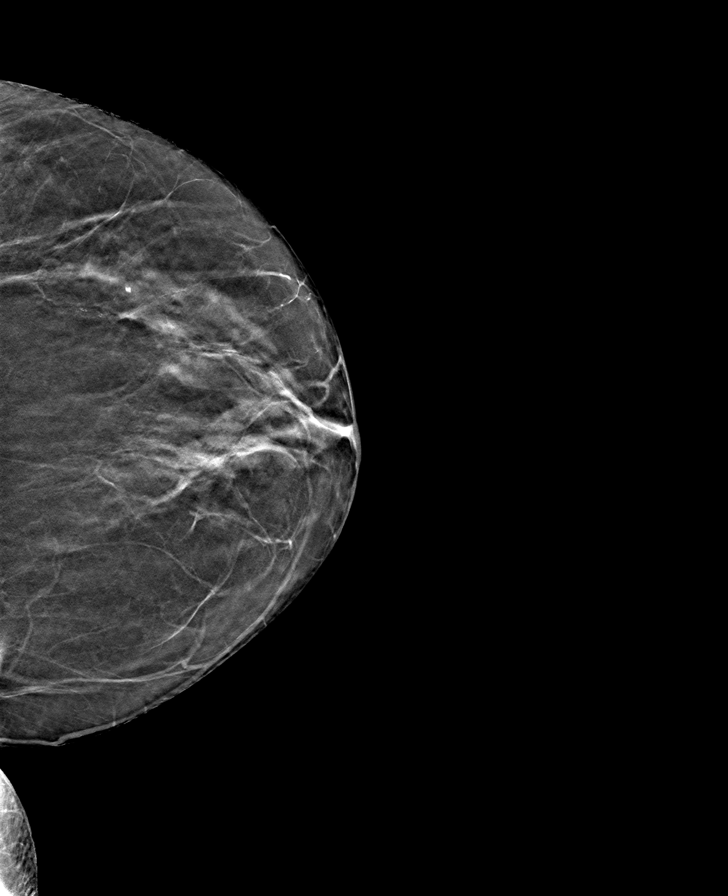

[R MLO tomo · tomo slice 33/66.0]
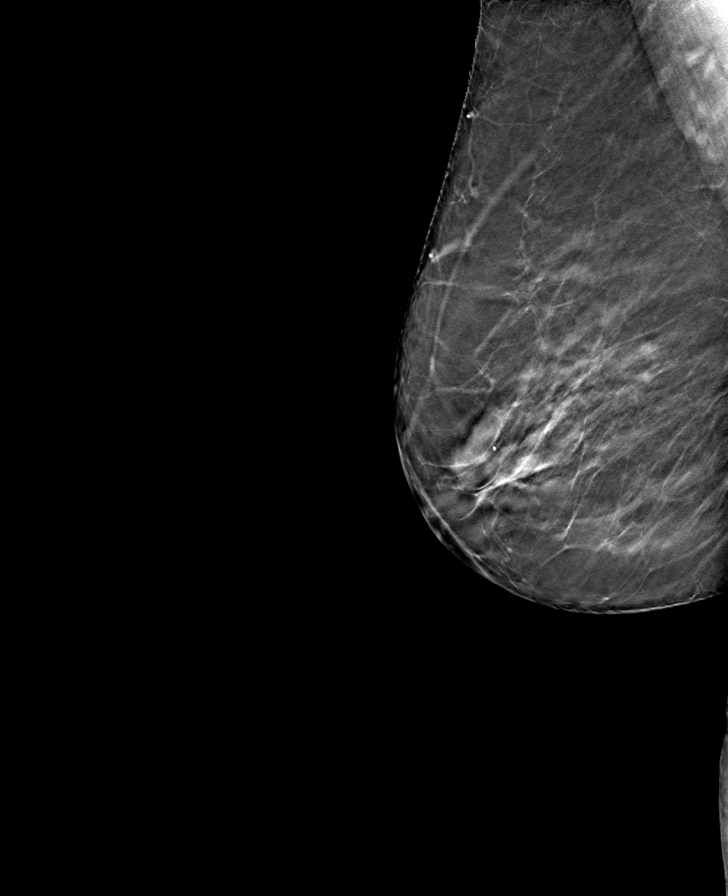

[8 of 24 positions shown; findings below may reference images not displayed]

FINDINGS: Stable exam. No suspicious mass, calcifications, or area of 
architectural distortion in either breast.
IMPRESSION: No mammographic evidence of malignancy in either breast. 
(BI-RADS 2) Benign findings. Routine mammographic follow-up is recommended.

## 2021-05-30 IMAGING — CT CT ABDOMEN AND PELVIS WITH CONTRAST
2 of 3 series · 16 of 46 positions shown, 18 images · IV contrast (isovue)
Comparison: 05/24/2020

FINAL Diagnostic Imaging Report 
________________________________________________________________________________________________ 
CT ABDOMEN AND PELVIS WITH CONTRAST, 05/30/2021 [DATE]: 
A search for DICOM formatted images was conducted for prior CT imaging studies 
completed at a non-affiliated media free facility.   
CLINICAL INDICATION: Pelvic and perineal pain.
TECHNIQUE: The abdomen and pelvis was scanned from lung bases through the pubic 
rami with 100 mL of Isovue 300 on a high-resolution CT scanner using dose 
reduction techniques.  Routine MPR reconstructions were performed.

[Series 4: abd/pel ax w · axial · 0.88mm/px · z∈[-428,-5]mm · 13 of 163 slices shown, 15 images]
[im 11/163  soft-tissue]
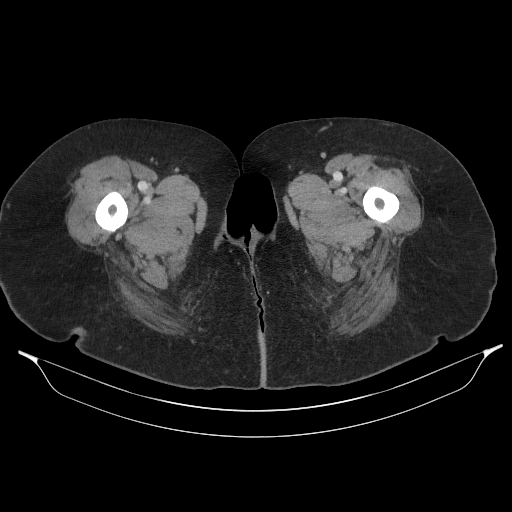
[im 11/163  bone]
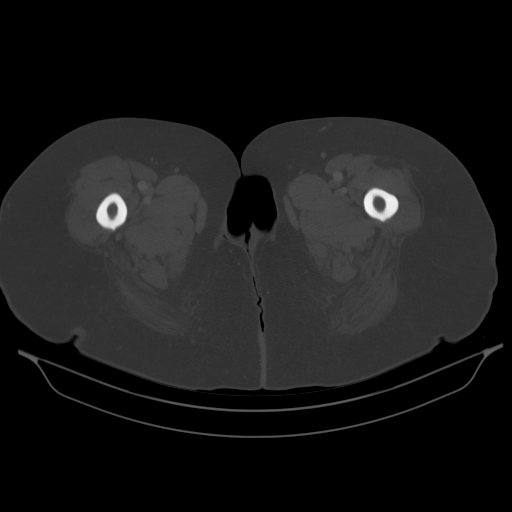
[im 21/163  soft-tissue]
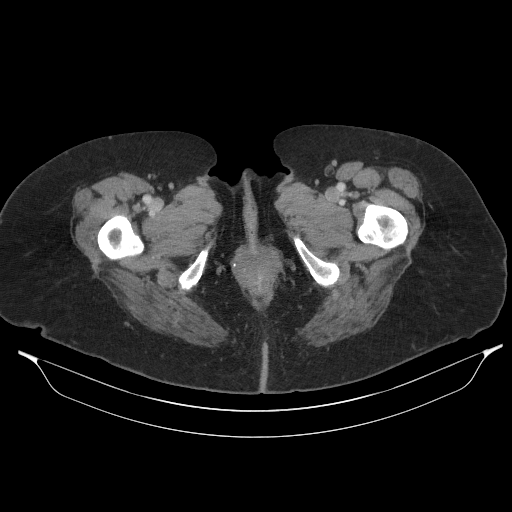
[im 32/163  soft-tissue]
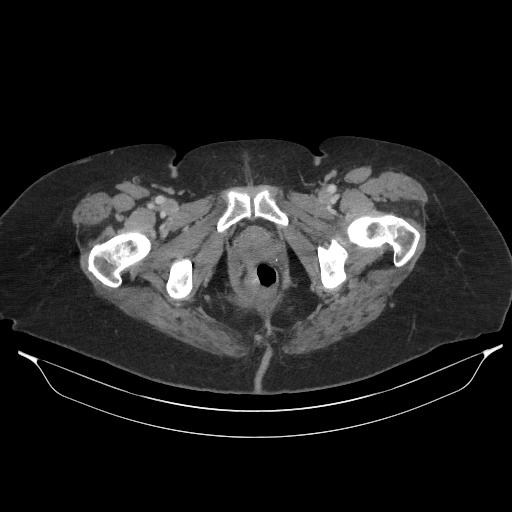
[im 48/163  soft-tissue]
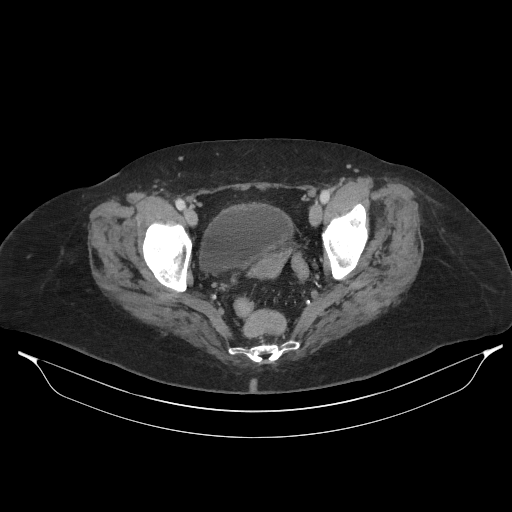
[im 58/163  soft-tissue]
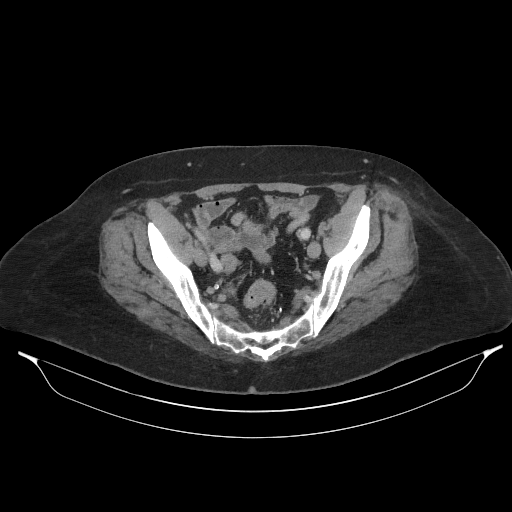
[im 68/163  soft-tissue]
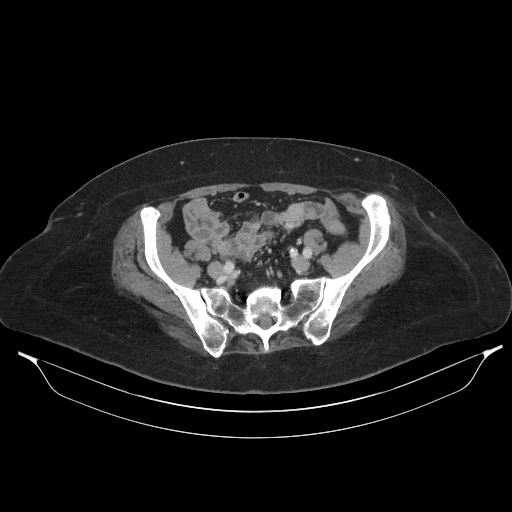
[im 84/163  soft-tissue]
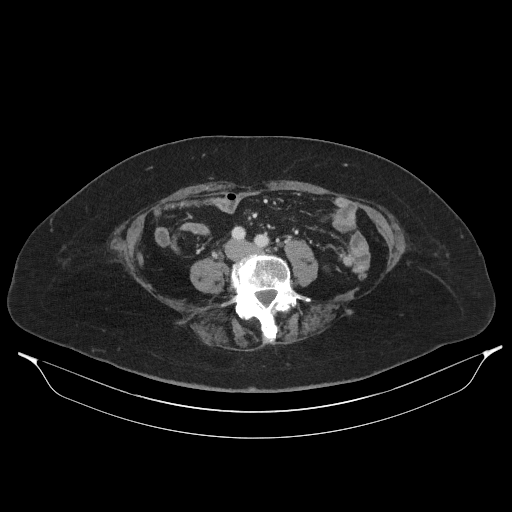
[im 95/163  soft-tissue]
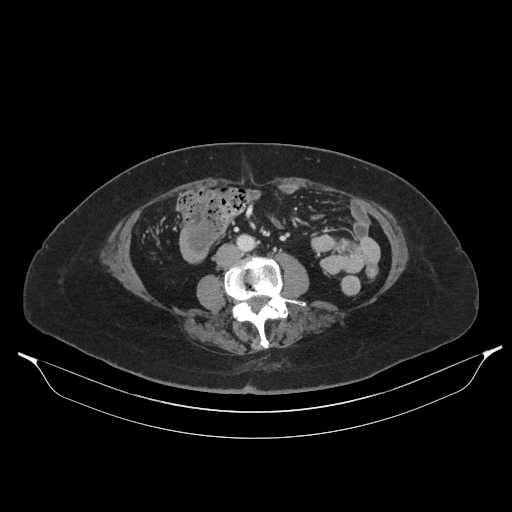
[im 105/163  soft-tissue]
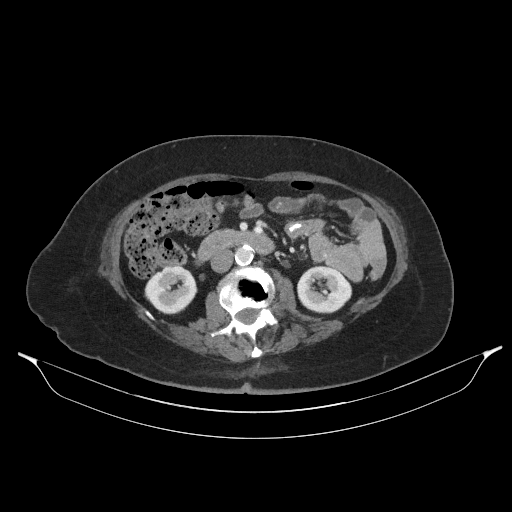
[im 105/163  bone]
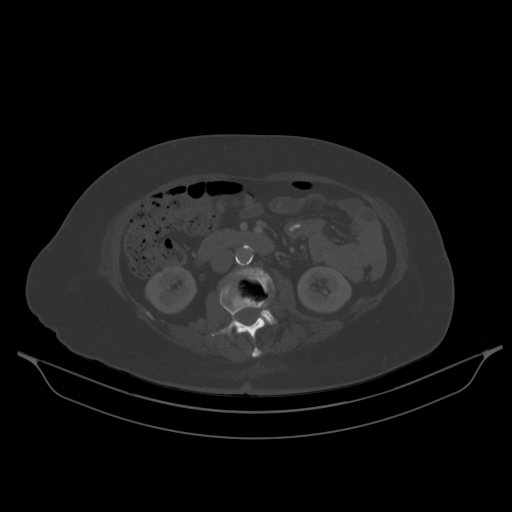
[im 115/163  soft-tissue]
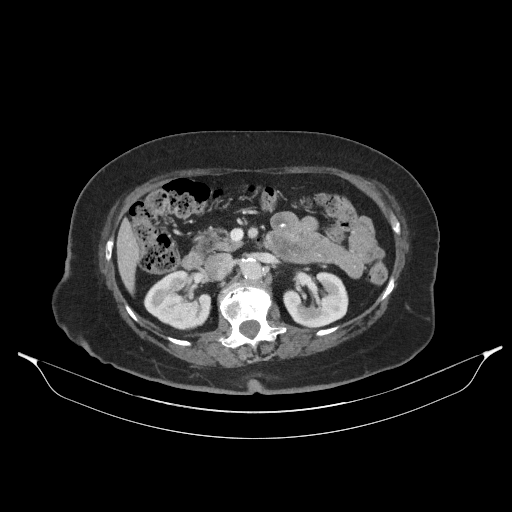
[im 131/163  soft-tissue]
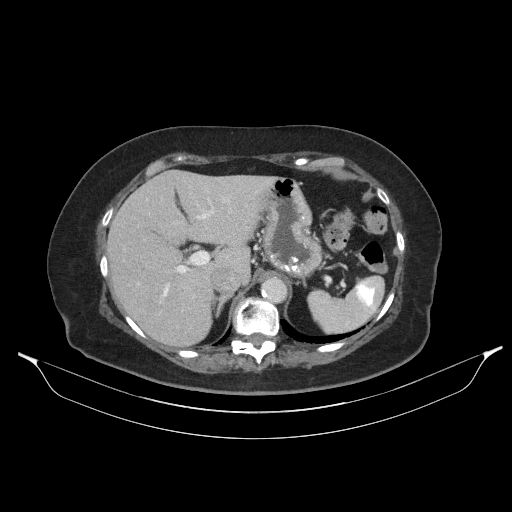
[im 142/163  soft-tissue]
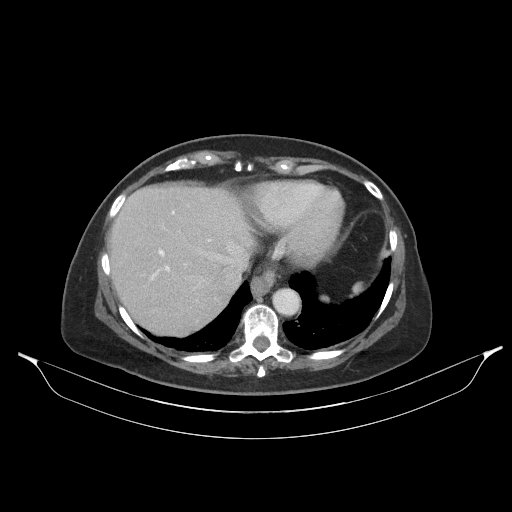
[im 152/163  soft-tissue]
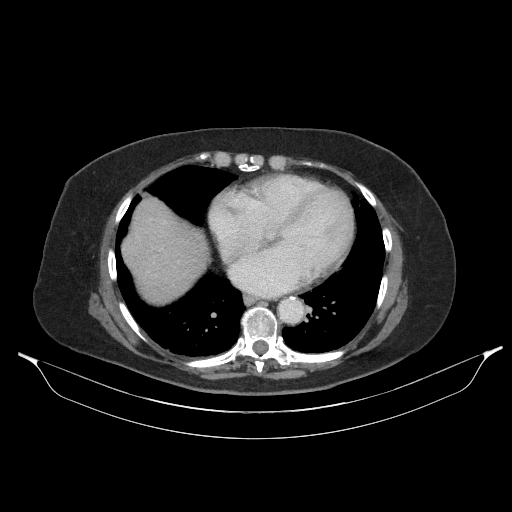

[Series 5: abd/pel cor w · coronal · 0.94mm/px · 3 of 113 slices shown]
[im 38/113  soft-tissue]
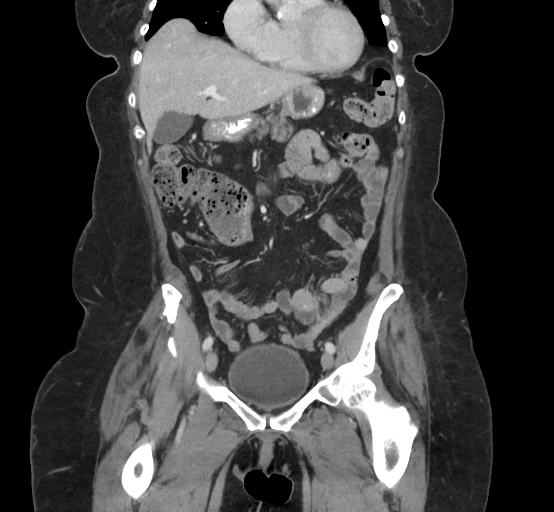
[im 50/113  soft-tissue]
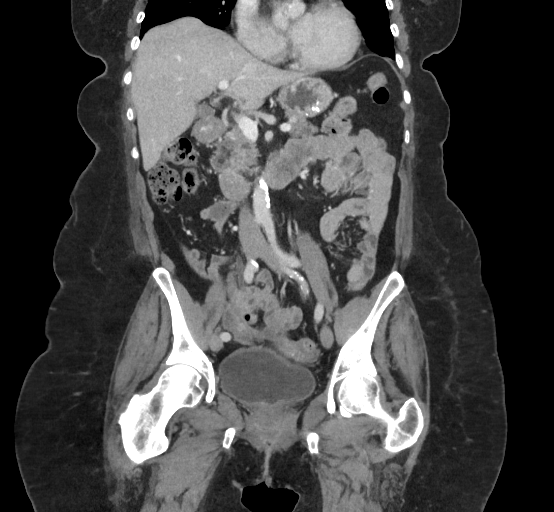
[im 63/113  soft-tissue]
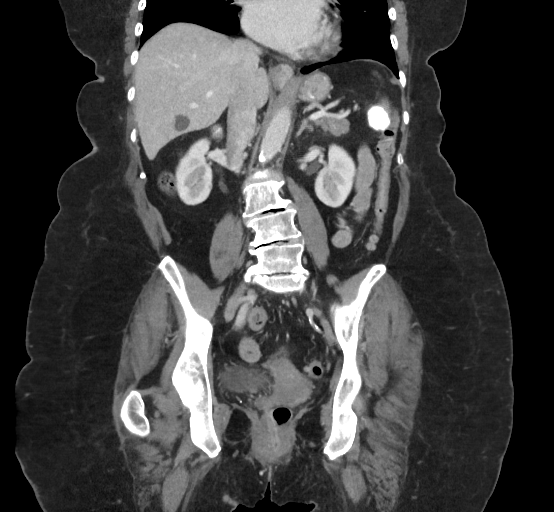

[16 of 46 positions shown; findings below may reference images not displayed]

FINDINGS: LUNG BASES/HEART: Lung bases are clear. No pleural effusions. Heart is 
borderline in size. Moderate coronary arterial calcifications. 
HEPATOBILIARY: No mass or biliary dilatation. Hepatic cysts. No gallstones. 
SPLEEN: Normal in size. Stable 5.2 x 3.2 cm cystic focus in the inferior spleen 
with coarse peripheral calcification, likely due to remote trauma. 
PANCREAS: No ductal dilatation or mass.   
ADRENALS: No mass. 
GENITOURINARY: No enhancing mass or hydronephrosis.  Renal cysts. Bladder is 
unremarkable. Resolution of the small amount of intravesicular gas on prior 
study. 
LYMPH NODES: No adenopathy. 
STOMACH, SMALL BOWEL AND COLON: Small hiatal hernia. No bowel wall thickening or 
obstruction. Normal appendix. 
VASCULAR STRUCTURES: No aneurysm. Atherosclerosis. 
MUSCULOSKELETAL: No acute osseous abnormality. Degenerative change and rotatory 
levoscoliosis.
IMPRESSION: 1.  Small hiatal hernia. 
2.  Hepatic and renal cysts. 
3.  Stable 5.2 x 3.2 cm cystic focus in the inferior spleen with coarse 
peripheral calcification, likely due to remote trauma. 
4.  Heart is borderline in size atherosclerosis and moderate coronary arterial 
calcifications. 
5.  Degenerative change and rotatory levoscoliosis. 
The patients eGFR was calculated to be 97.5 mL/min/1.73 m2 using the i-STAT 
device. 
RADIATION DOSE REDUCTION: All CT scans are performed using radiation dose 
reduction techniques, when applicable.  Technical factors are evaluated and 
adjusted to ensure appropriate moderation of exposure.  Automated dose 
management technology is applied to adjust the radiation doses to minimize 
exposure while achieving diagnostic quality images.

## 2021-06-14 IMAGING — DX HIP 2 VIEWS LEFT WITH PELVIS
2 series · 3 of 3 positions shown · non-contrast
Comparison: CT exam of the abdomen and pelvis of 05/30/2021.

FINAL Diagnostic Imaging Report 
Referring: NYA JUMPER, DO                  
________________________________________________________________________________________________ 
HIP 2 VIEWS LEFT WITH PELVIS, 06/14/2021 [DATE]: 
CLINICAL INDICATION: Left hip pain. No known trauma.

[AP (1 of 2)]
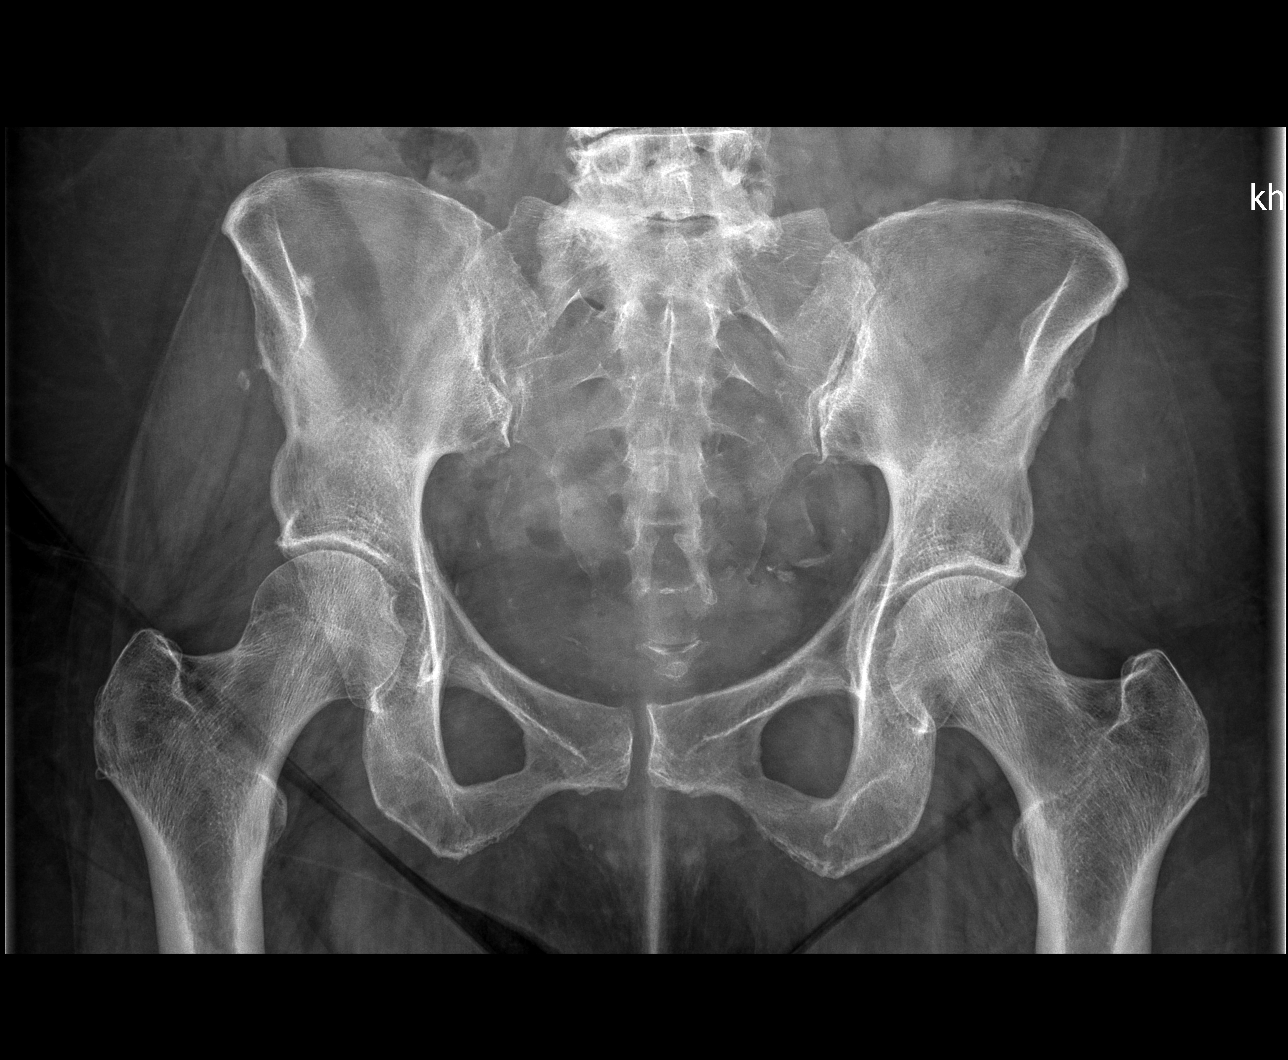

[Series 2: AP · 0.14mm/px · 2 of 2 slices shown (2 of 2)]
[im 1/2]
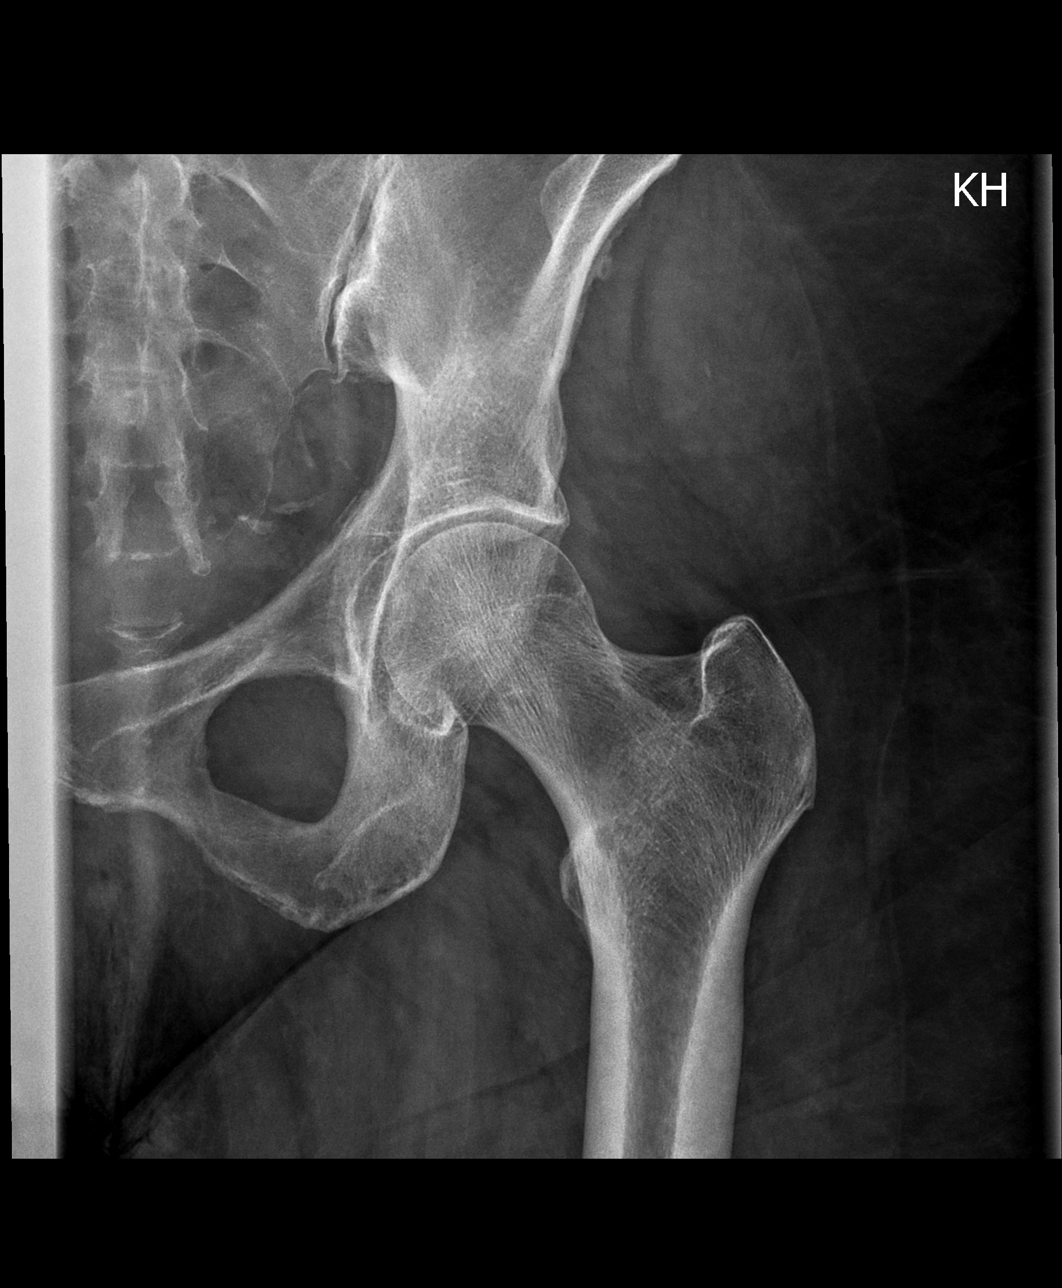
[im 2/2]
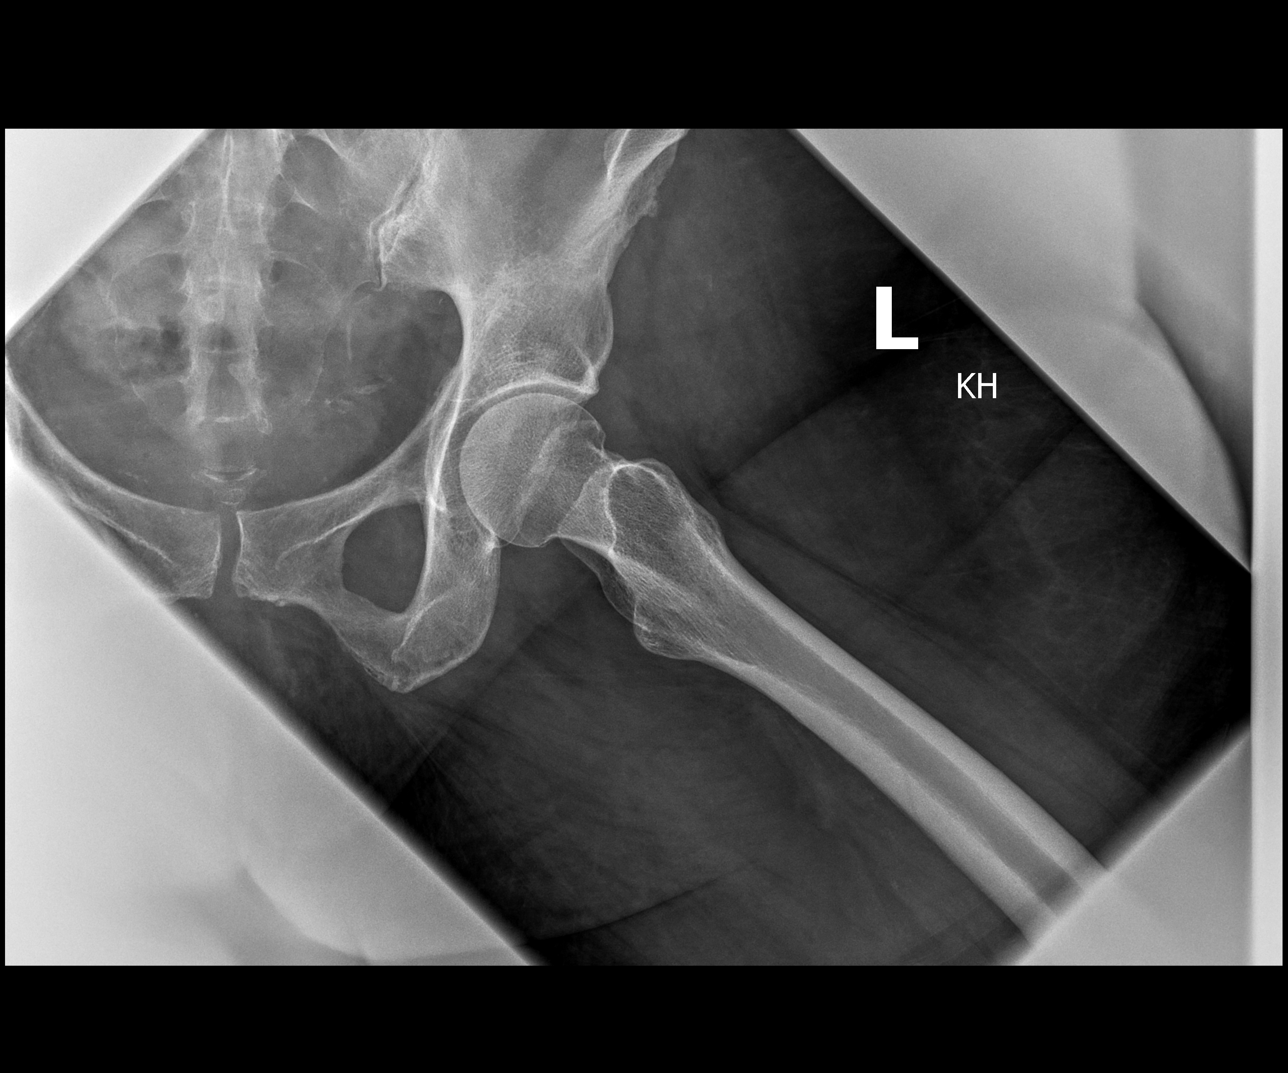

[3 of 3 positions shown; findings below may reference images not displayed]

FINDINGS: No fracture. Mild joint space narrowing both hips. Lower lumbar 
curvature is partially included with moderately advanced spondylotic changes. 
Mild degenerative changes SI joints. Mild scattered vascular calcifications.
IMPRESSION: Degenerative changes including mild involvement of the hips. If symptoms 
persist, consideration could be made for MR exam.

## 2021-09-02 IMAGING — MR MRI LUMBAR SPINE WITHOUT CONTRAST
4 of 7 series · 15 of 48 positions shown · IV contrast (gadolinium)
Comparison: CT examination the abdomen and pelvis of 05/30/2021.

________________________________________________________________________________________________ 
MRI LUMBAR SPINE WITHOUT CONTRAST, 09/02/2021 [DATE]: 
CLINICAL INDICATION: Radiculopathy, lumbar region. Low back pain with left 
hip/groin radiculopathy. Left hip/groin area feels swollen compared to right 
side.
TECHNIQUE: Multiplanar, multiecho position MR images of the lumbar spine were 
performed without intravenous gadolinium enhancement. Patient was scanned on a 
3T magnet.

[Series 101: survey · axial · 10.0mm · 1.39mm/px · z∈[-15,+199]mm · 2 of 9 slices shown]
[im 1/9]
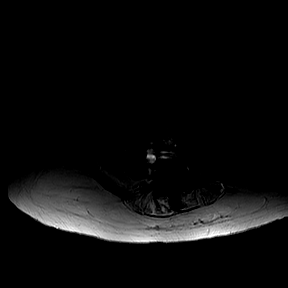
[im 9/9]
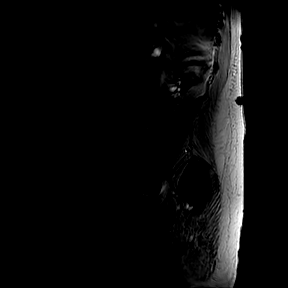

[Series 201: t2w_cor-surv · coronal · 6.0mm · 0.50mm/px · 2 of 5 slices shown]
[im 1/5]
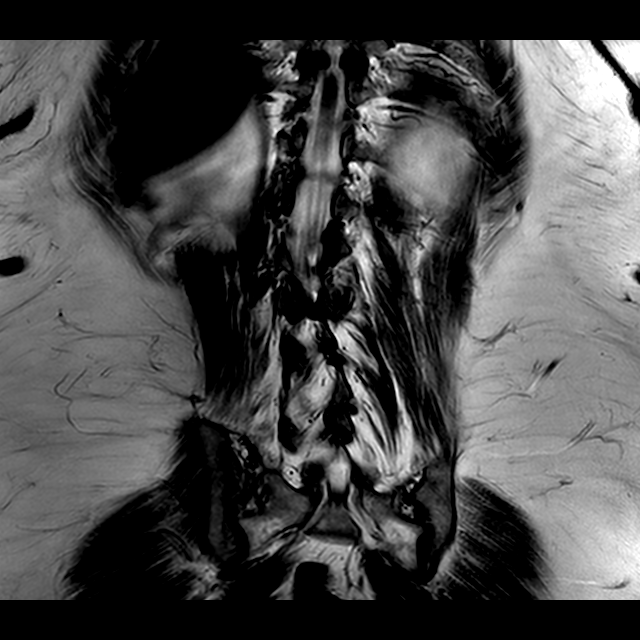
[im 5/5]
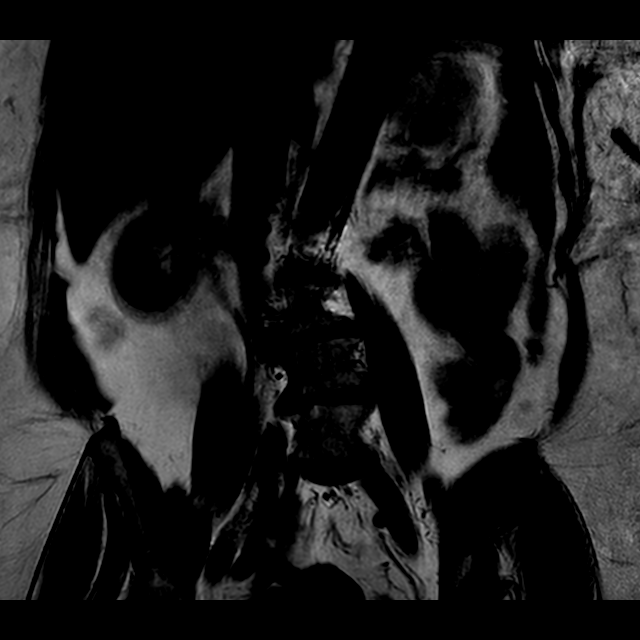

[Series 301: t2w_tse sag · sagittal · 4.0mm · 0.35mm/px · 3 of 17 slices shown]
[im 4/17]
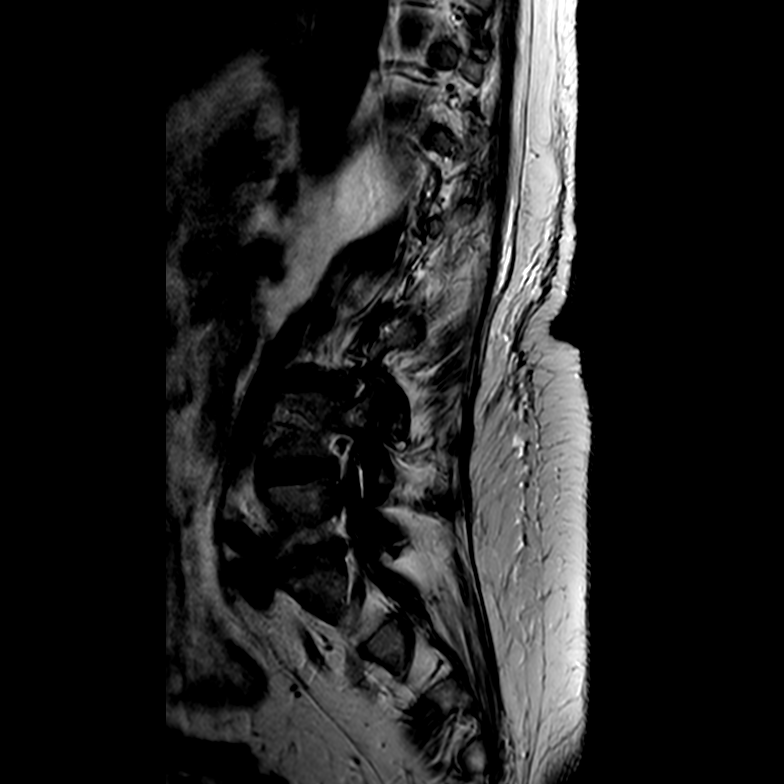
[im 10/17]
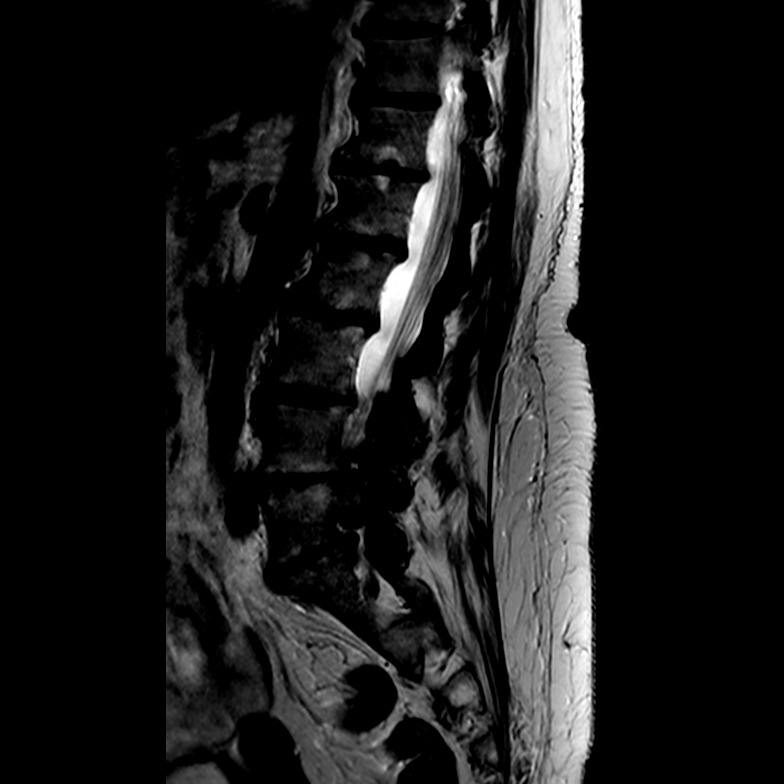
[im 17/17]
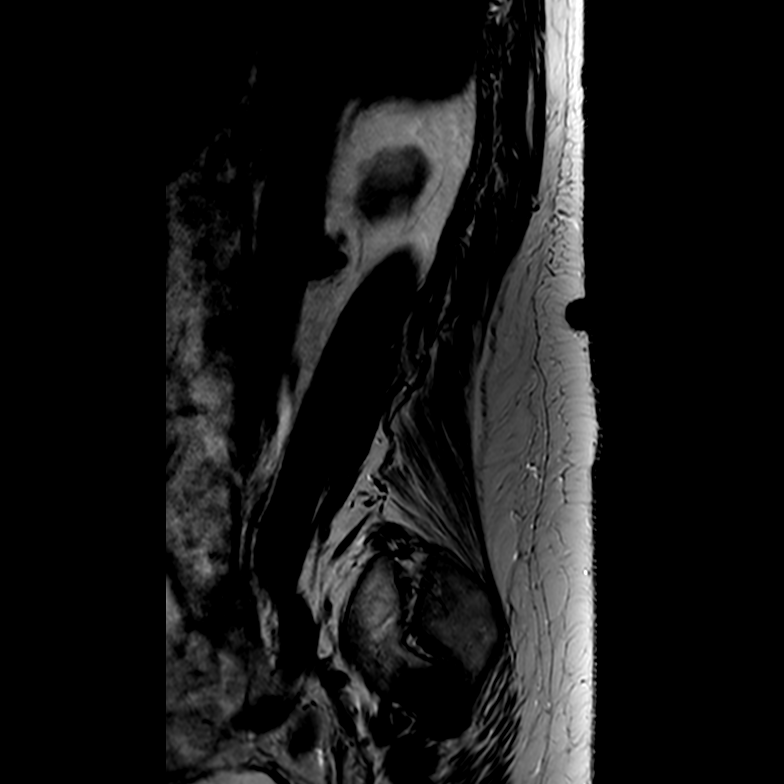

[Series 701: T1 · axial · 4.0mm · 0.35mm/px · z∈[-61,+122]mm · 8 of 42 slices shown]
[im 1/42]
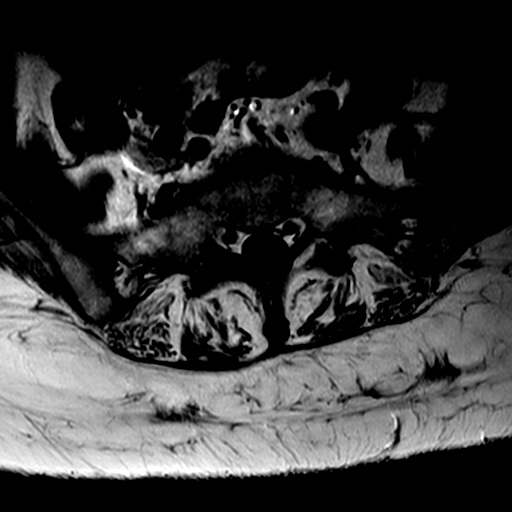
[im 6/42]
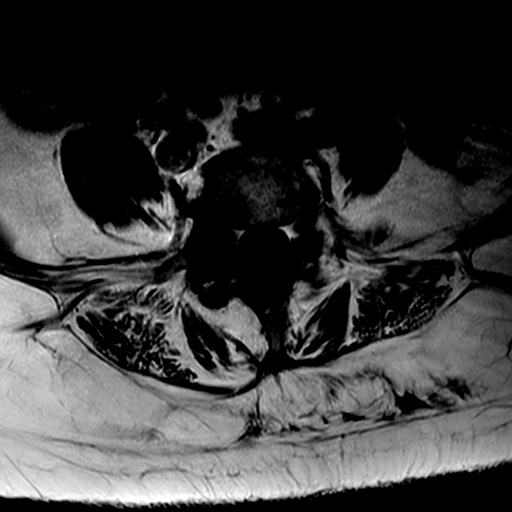
[im 12/42]
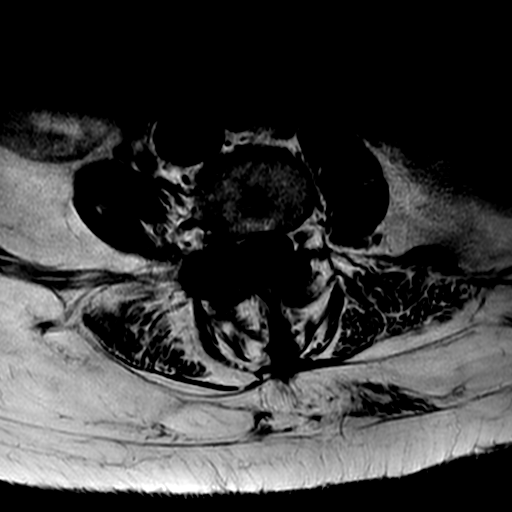
[im 18/42]
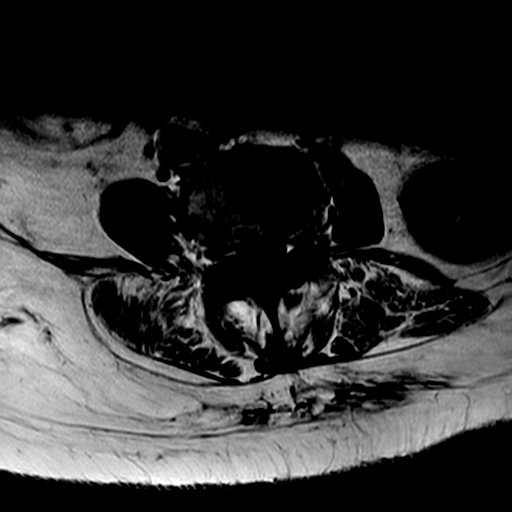
[im 21/42]
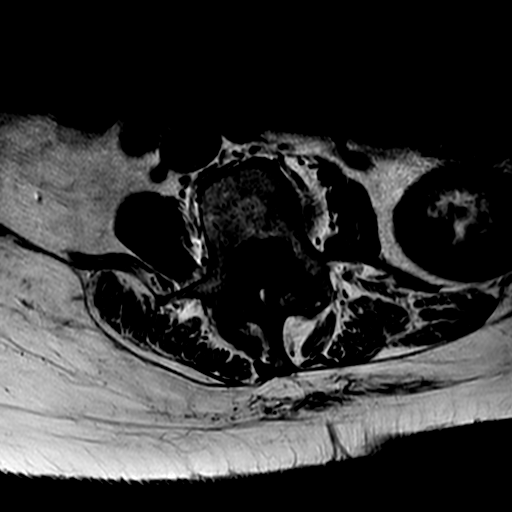
[im 24/42]
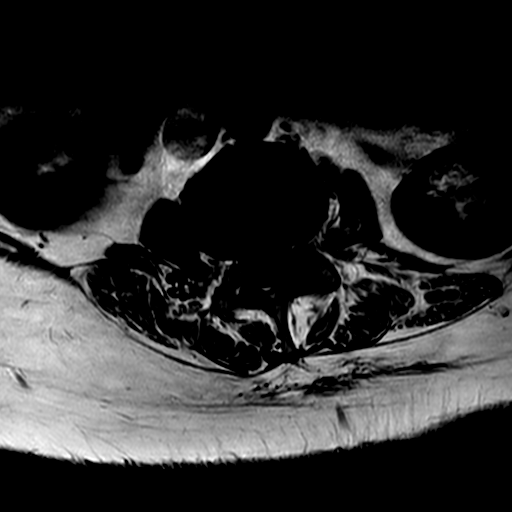
[im 30/42]
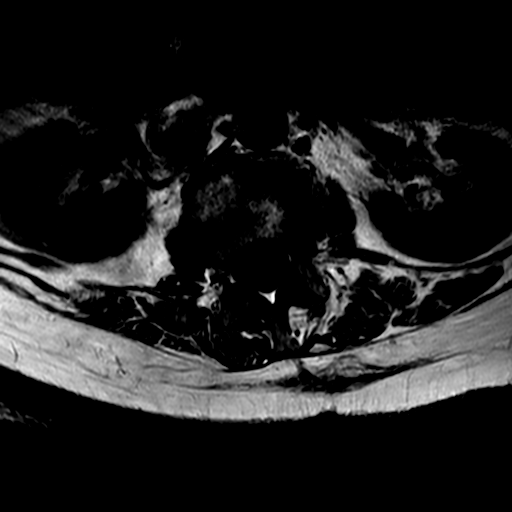
[im 36/42]
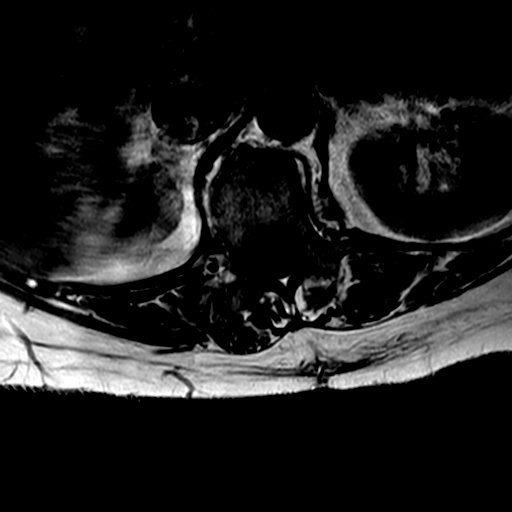

[15 of 48 positions shown; findings below may reference images not displayed]

FINDINGS: 5 lumbar-type vertebral bodies. Thoracolumbar curvature. Scattered 
interbody osteophytic spurring. No vertebral body fracture. No 
spondylolisthesis. The conus tip terminates at L1-L2 level. The aorta is normal 
in diameter. The posterior paraspinal musculature is symmetric. Partially 
included are splenic, pancreatic and renal cysts..  
Modic I-II: All levels 
Ligamentum Flavum > 2.5 mm: All levels 
T12-L1: Moderate disc desiccation and disc height loss. Shallow broad-based disc 
protrusion. Normal facets. No spinal canal or neural foraminal stenosis. 
L1-L2: Moderate disc desiccation and disc height loss. Mild broad-based disc 
protrusion. Mild facet hypertrophy. Mild left neural foraminal stenosis. 
L2-L3: Moderate disc desiccation and disc height loss. Mild broad-based disc 
protrusion. Mild facet hypertrophy. Mild bilateral neural foraminal stenosis. 
L3-L4: Moderate disc desiccation and mild disc height loss. Mild facet 
hypertrophy. Mild bilateral neural foraminal stenoses. 
L4-L5: Moderate disc desiccation and disc height loss. Right posterior lateral 
disc osteophyte complex. There is facet hypertrophy greater on the right. Small 
left facet effusion. Severe right neural foraminal stenosis. 
L5-S1: Moderate disc desiccation and disc height loss. Mild dorsal disc 
osteophyte complex. Now facet hypertrophy. Mild to moderate bilateral neural 
foraminal stenoses, greater on the right.
IMPRESSION: Thoracolumbar curvature with moderately advanced spondylotic changes lumbar 
spine. 
Multilevel neural foraminal stenoses greatest on the right at L4-L5.

## 2022-05-22 IMAGING — CT CT CHEST WITHOUT CONTRAST
2 of 5 series · 14 of 36 positions shown, 17 images · IV contrast (isovue)
Comparison: Review made of studies with left 12 months with comparison made to 
05/23/2021 CT abdomen. Comparison also made to 08/12/2019 CXR.

________________________________________________________________________________________________ 
CT CHEST WITHOUT CONTRAST, CT ABDOMEN AND PELVIS WITH CONTRAST, 05/22/2022 [DATE]: 
A search for DICOM formatted images was conducted for prior CT imaging studies 
completed at a non-affiliated media free facility.   
CLINICAL INDICATION: Abdominal pain.
TECHNIQUE: The chest, abdomen and pelvis were scanned with 100 mL of Isovue 300 
injected intravenously on a high-resolution CT scanner using dose reduction 
techniques. Routine MPR reconstructions were performed.

[Series 2: chest w/o 2.0 i31s 3 · axial · non-contrast · 0.72mm/px · z∈[-297,-39]mm · 11 of 155 slices shown, 14 images]
[im 13/155  mediastinal]
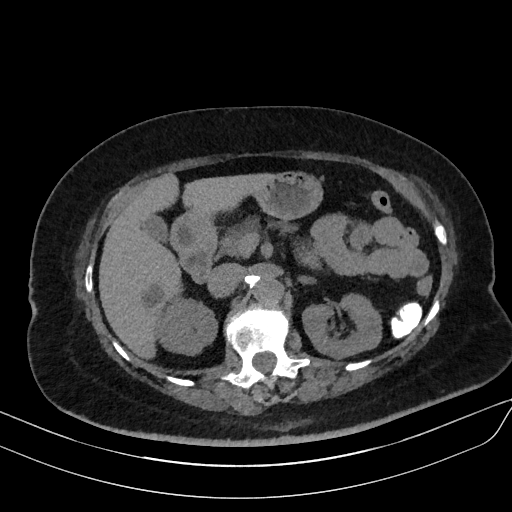
[im 13/155  lung]
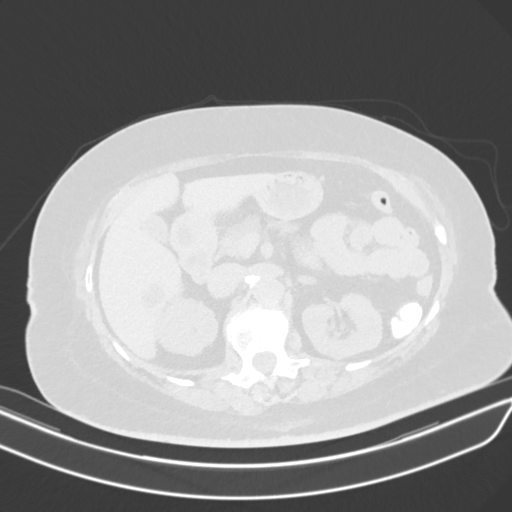
[im 26/155  lung]
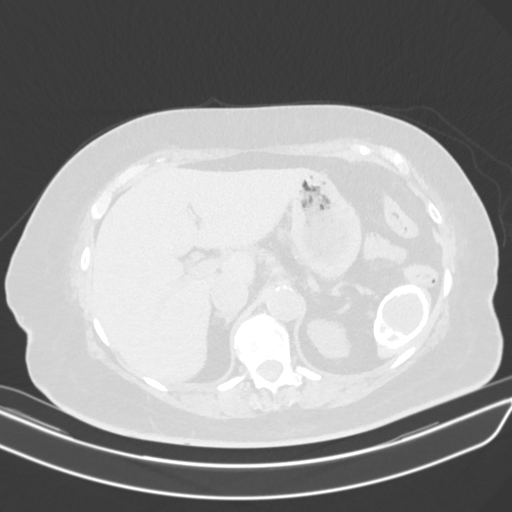
[im 39/155  lung]
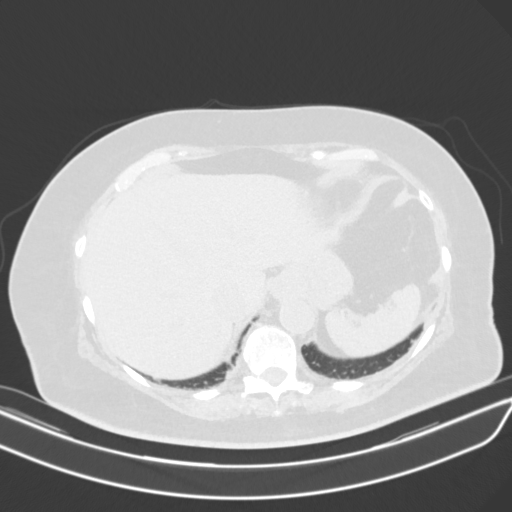
[im 52/155  lung]
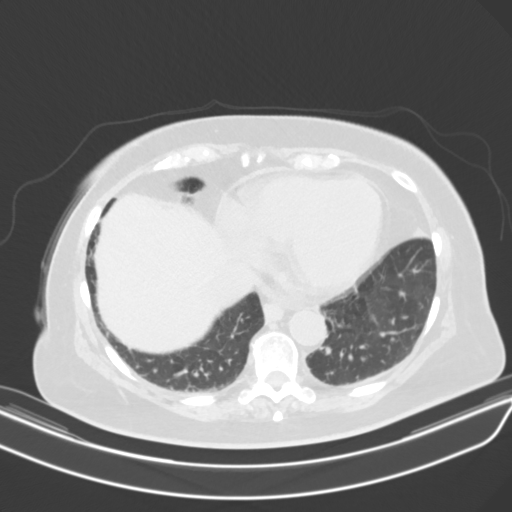
[im 65/155  mediastinal]
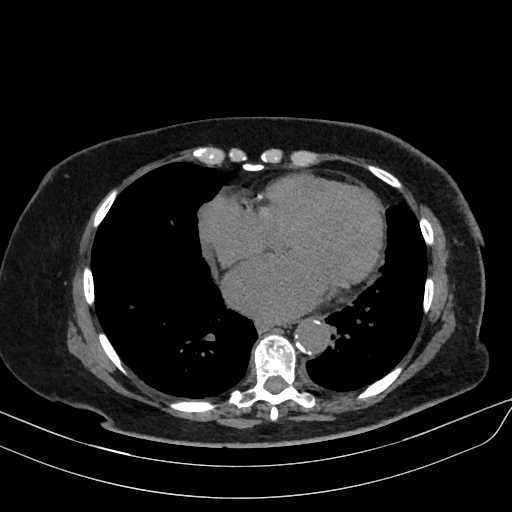
[im 65/155  lung]
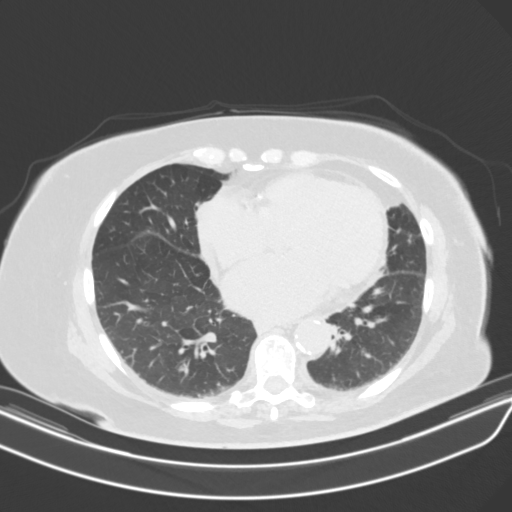
[im 78/155  lung]
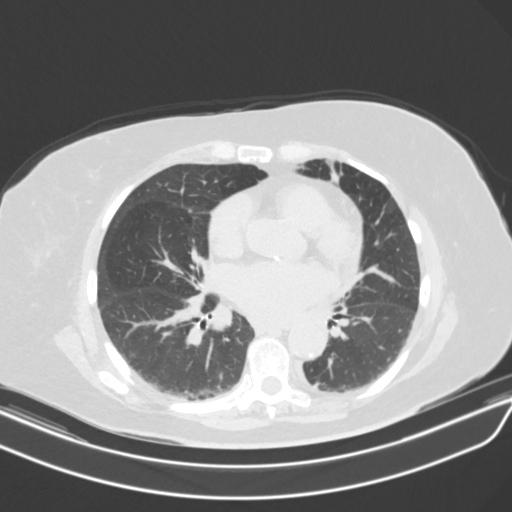
[im 90/155  lung]
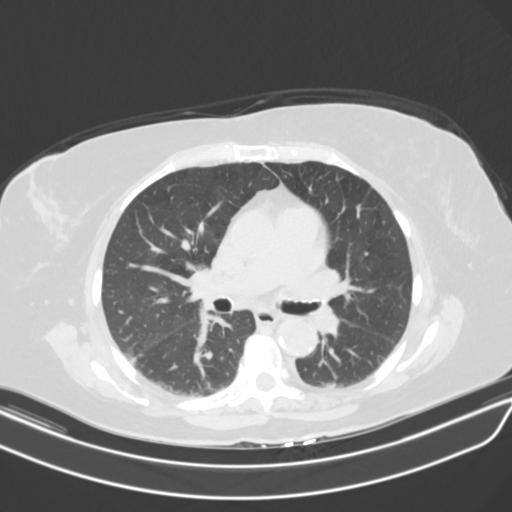
[im 103/155  lung]
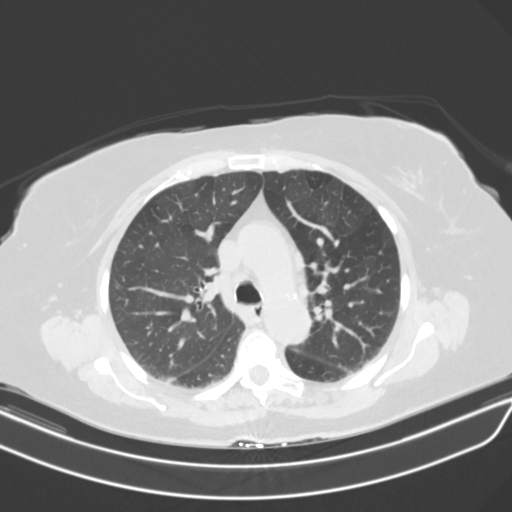
[im 116/155  mediastinal]
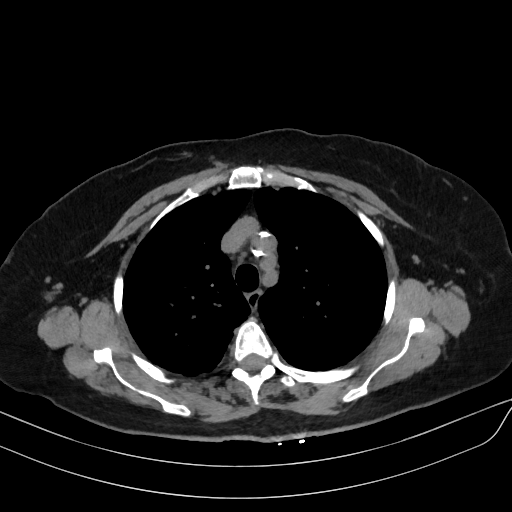
[im 116/155  lung]
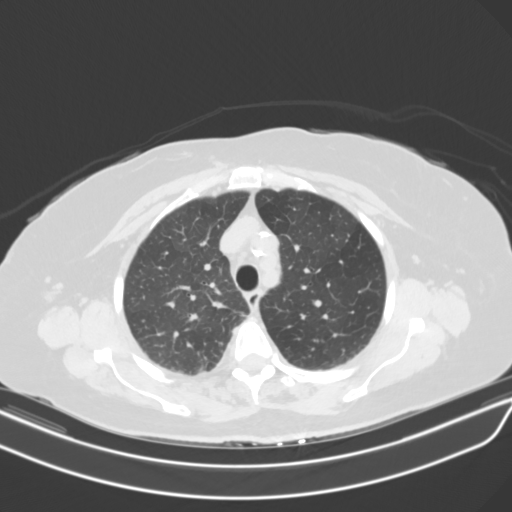
[im 129/155  lung]
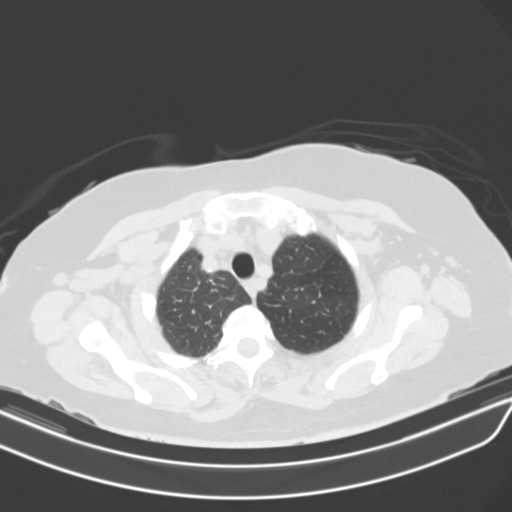
[im 142/155  lung]
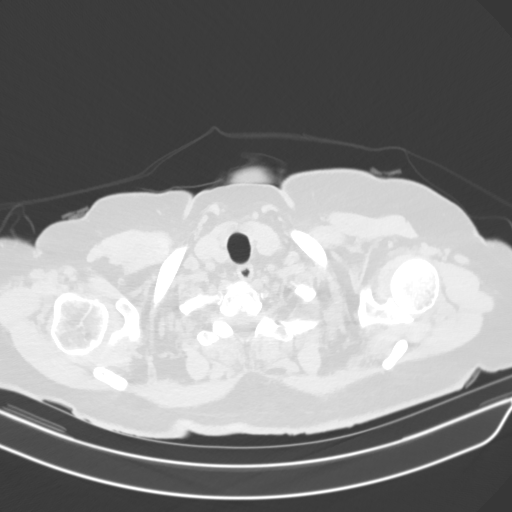

[Series 4: coronal · coronal · 0.61mm/px · 3 of 135 slices shown]
[im 27/135  lung]
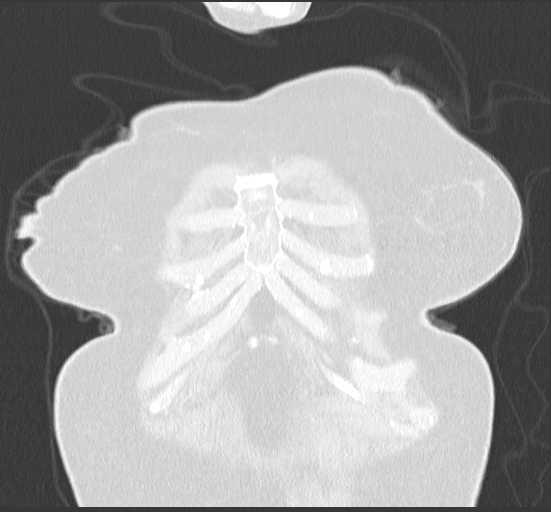
[im 54/135  lung]
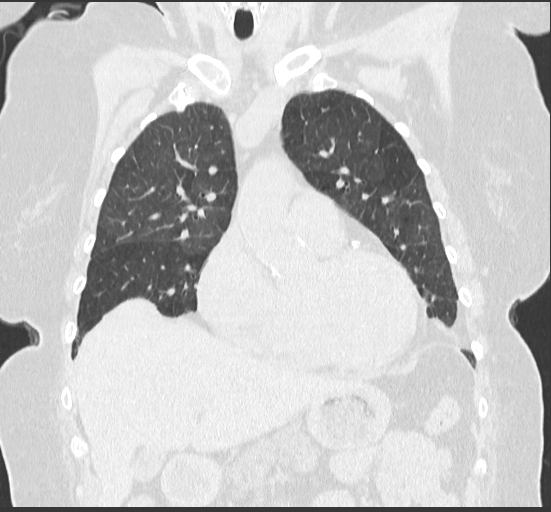
[im 81/135  lung]
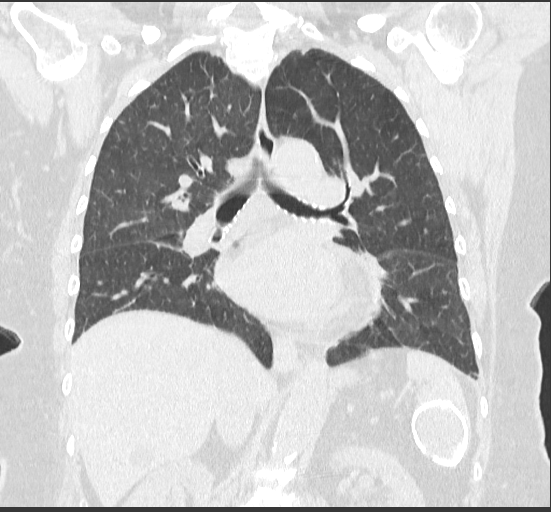

[14 of 36 positions shown; findings below may reference images not displayed]

FINDINGS: LUNGS/PLEURA: Multiple peripheral micronodules (some of which are clustered in 
the RUL), the largest of which measures 3.6 x 3.3 mm. Mild emphysema. Lingular 
atelectasis. No pleural effusions. No pneumothorax. 
MEDIASTINUM: No mass. Mild cardiomegaly. Marked coronary arterial 
calcifications. Pulmonary arteries are normal in caliber. 
HEPATOBILIARY: No mass or biliary dilatation. Hepatic cysts. No gallstones. 
SPLEEN: Normal in size. Stable 5.1 x 3.4 cm cystic focus in the inferior spleen 
with coarse peripheral calcification, likely due to remote trauma. 
PANCREAS: No ductal dilatation or mass.   
ADRENALS: No mass. 
GENITOURINARY: No enhancing mass or hydronephrosis.  Renal cysts. Bladder is 
unremarkable.  
LYMPH NODES: No adenopathy. 
STOMACH, SMALL BOWEL AND COLON: Small hiatal hernia. No bowel wall thickening or 
obstruction. Normal appendix. 
VASCULAR STRUCTURES: No aneurysm. Atherosclerosis. 
MUSCULOSKELETAL: No acute osseous abnormality. Degenerative change and rotatory 
thoracolumbar curve.
IMPRESSION: 1.  Multiple peripheral micronodules (up to 3.6 x 3.3 mm), mild emphysema and 
lingular atelectasis.  
2.  Mild cardiomegaly, atherosclerosis and marked coronary arterial 
calcifications. 
3.  Small hiatal hernia. 
4.  Hepatic and renal cysts. 
5.  Stable 5.1 x 3.4 cm cystic focus in the inferior spleen with coarse 
peripheral calcification, likely due to remote trauma. 
6.  Degenerative change and rotatory levoscoliosis. 
RADIATION DOSE REDUCTION: All CT scans are performed using radiation dose 
reduction techniques, when applicable. Technical factors are evaluated and 
adjusted to ensure appropriate moderation of exposure. Automated dose management 
technology is applied to adjust the radiation doses to minimize exposure while 
achieving diagnostic quality images.

## 2022-05-22 IMAGING — CT CT ABDOMEN AND PELVIS WITH CONTRAST
2 of 3 series · 16 of 46 positions shown, 18 images · IV contrast (APPLIED)
Comparison: Review made of studies with left 12 months with comparison made to 
05/23/2021 CT abdomen. Comparison also made to 08/12/2019 CXR.

________________________________________________________________________________________________ 
CT CHEST WITHOUT CONTRAST, CT ABDOMEN AND PELVIS WITH CONTRAST, 05/22/2022 [DATE]: 
A search for DICOM formatted images was conducted for prior CT imaging studies 
completed at a non-affiliated media free facility.   
CLINICAL INDICATION: Abdominal pain.
TECHNIQUE: The chest, abdomen and pelvis were scanned with 100 mL of Isovue 300 
injected intravenously on a high-resolution CT scanner using dose reduction 
techniques. Routine MPR reconstructions were performed.

[Series 5: abd/pel ax w · axial · 0.79mm/px · z∈[-604,-202]mm · 13 of 154 slices shown, 15 images]
[im 10/154  soft-tissue]
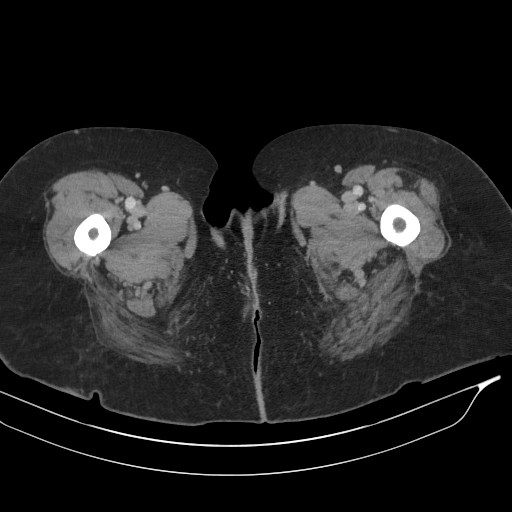
[im 10/154  bone]
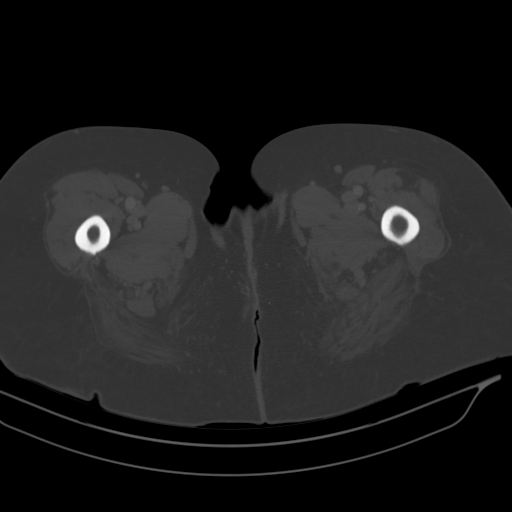
[im 20/154  soft-tissue]
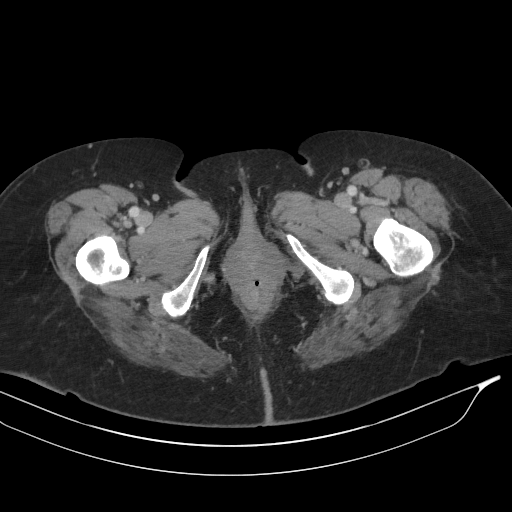
[im 30/154  soft-tissue]
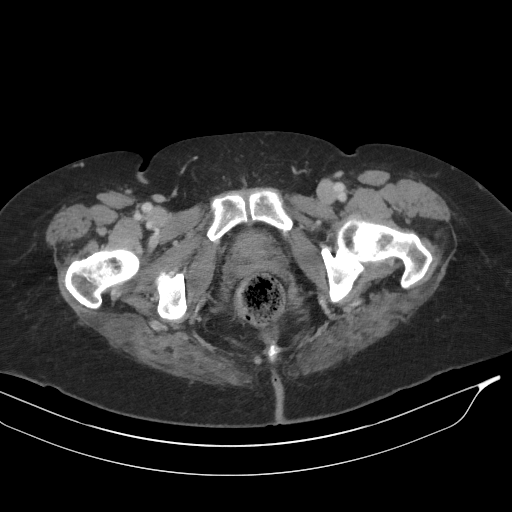
[im 45/154  soft-tissue]
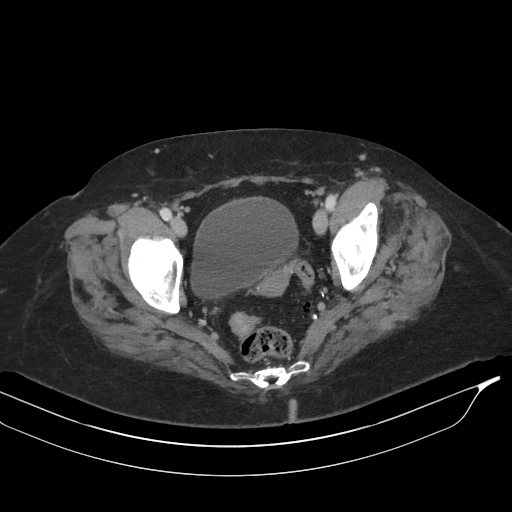
[im 55/154  soft-tissue]
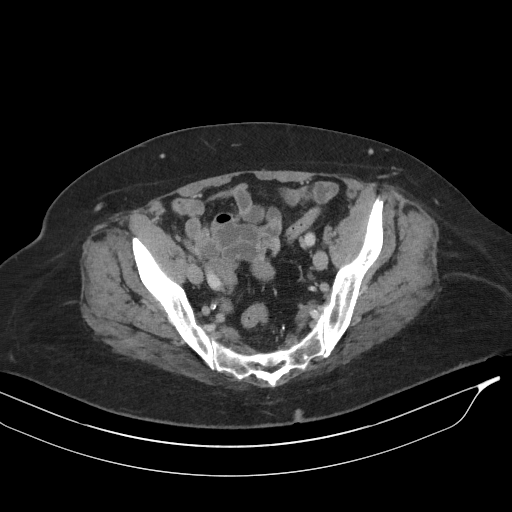
[im 65/154  soft-tissue]
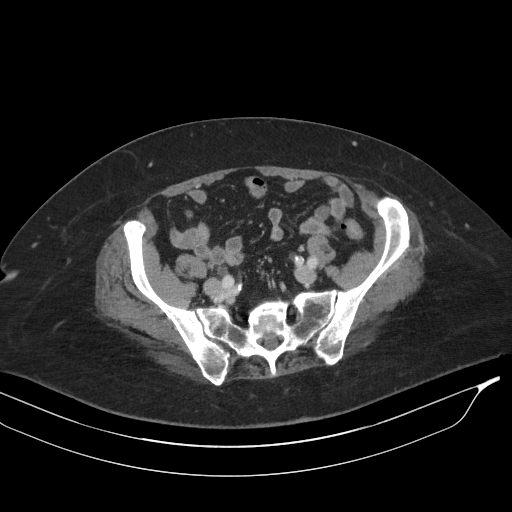
[im 79/154  soft-tissue]
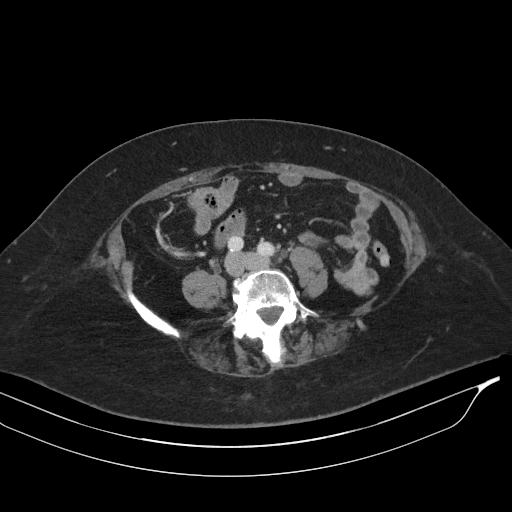
[im 89/154  soft-tissue]
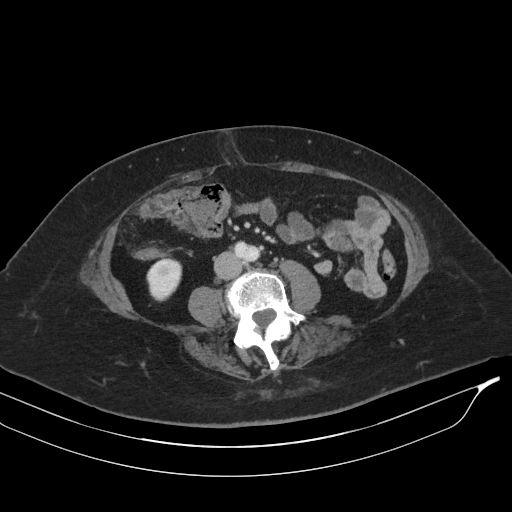
[im 99/154  soft-tissue]
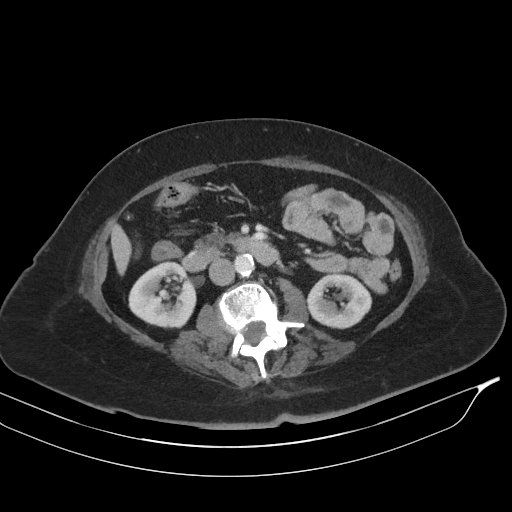
[im 99/154  bone]
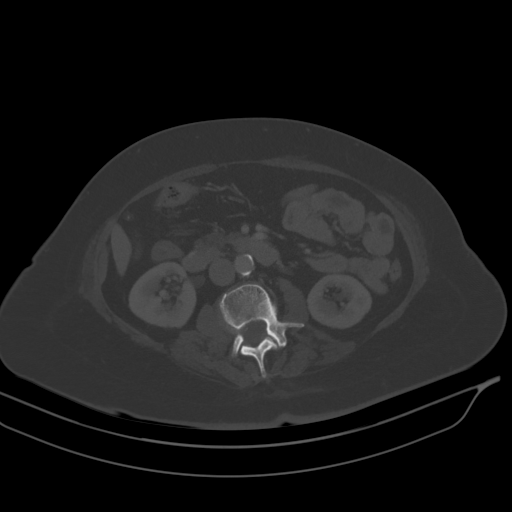
[im 109/154  soft-tissue]
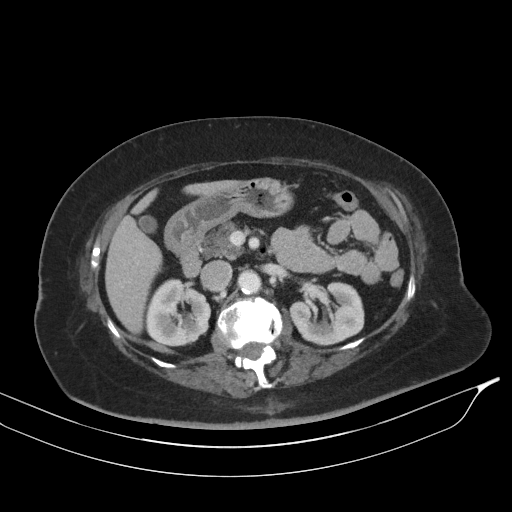
[im 124/154  soft-tissue]
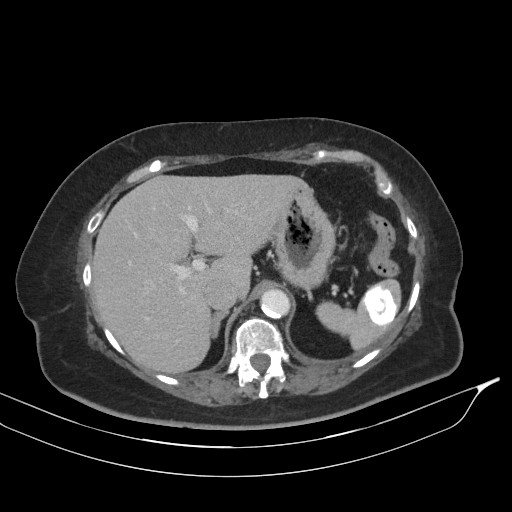
[im 134/154  soft-tissue]
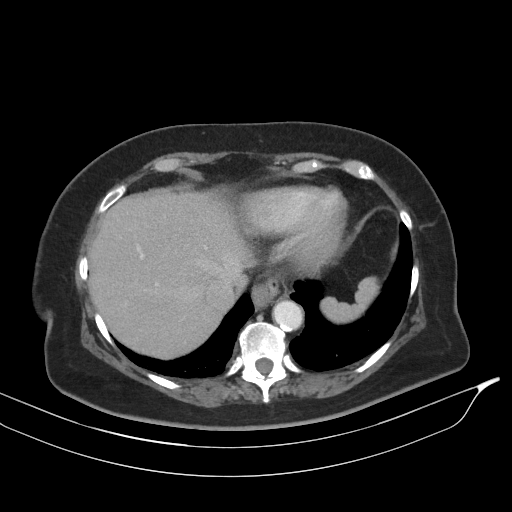
[im 144/154  soft-tissue]
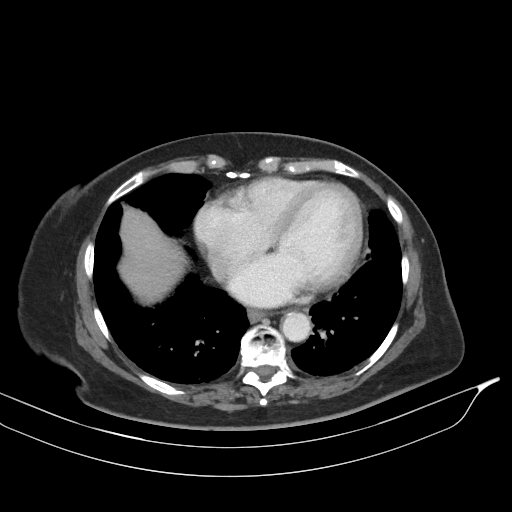

[Series 6: abd/pel cor w · coronal · 0.84mm/px · 3 of 143 slices shown]
[im 48/143  soft-tissue]
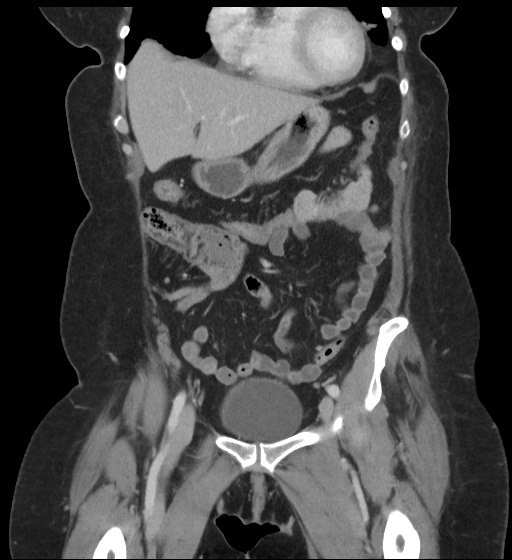
[im 64/143  soft-tissue]
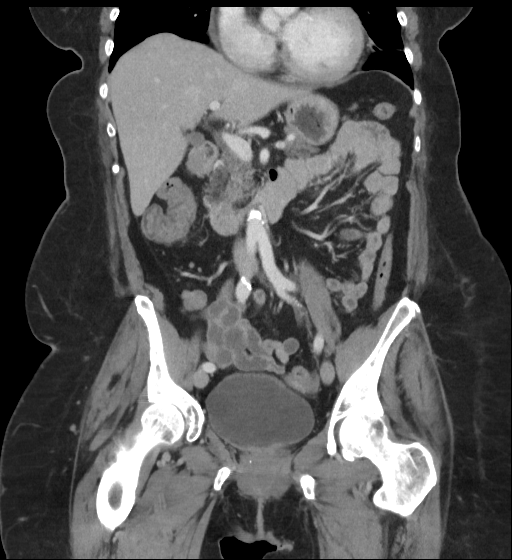
[im 79/143  soft-tissue]
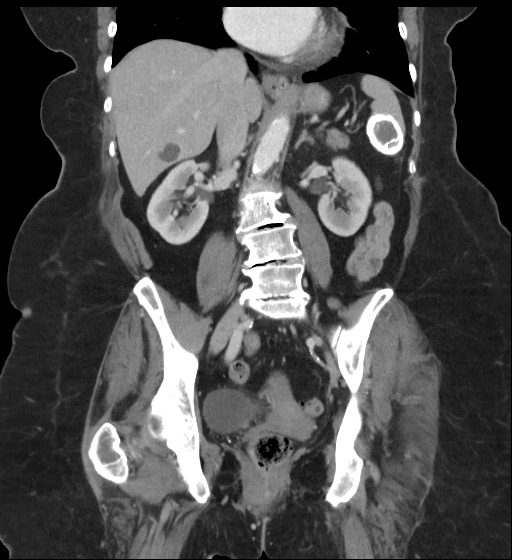

[16 of 46 positions shown; findings below may reference images not displayed]

FINDINGS: LUNGS/PLEURA: Multiple peripheral micronodules (some of which are clustered in 
the RUL), the largest of which measures 3.6 x 3.3 mm. Mild emphysema. Lingular 
atelectasis. No pleural effusions. No pneumothorax. 
MEDIASTINUM: No mass. Mild cardiomegaly. Marked coronary arterial 
calcifications. Pulmonary arteries are normal in caliber. 
HEPATOBILIARY: No mass or biliary dilatation. Hepatic cysts. No gallstones. 
SPLEEN: Normal in size. Stable 5.1 x 3.4 cm cystic focus in the inferior spleen 
with coarse peripheral calcification, likely due to remote trauma. 
PANCREAS: No ductal dilatation or mass.   
ADRENALS: No mass. 
GENITOURINARY: No enhancing mass or hydronephrosis.  Renal cysts. Bladder is 
unremarkable.  
LYMPH NODES: No adenopathy. 
STOMACH, SMALL BOWEL AND COLON: Small hiatal hernia. No bowel wall thickening or 
obstruction. Normal appendix. 
VASCULAR STRUCTURES: No aneurysm. Atherosclerosis. 
MUSCULOSKELETAL: No acute osseous abnormality. Degenerative change and rotatory 
thoracolumbar curve.
IMPRESSION: 1.  Multiple peripheral micronodules (up to 3.6 x 3.3 mm), mild emphysema and 
lingular atelectasis.  
2.  Mild cardiomegaly, atherosclerosis and marked coronary arterial 
calcifications. 
3.  Small hiatal hernia. 
4.  Hepatic and renal cysts. 
5.  Stable 5.1 x 3.4 cm cystic focus in the inferior spleen with coarse 
peripheral calcification, likely due to remote trauma. 
6.  Degenerative change and rotatory levoscoliosis. 
RADIATION DOSE REDUCTION: All CT scans are performed using radiation dose 
reduction techniques, when applicable. Technical factors are evaluated and 
adjusted to ensure appropriate moderation of exposure. Automated dose management 
technology is applied to adjust the radiation doses to minimize exposure while 
achieving diagnostic quality images.

## 2022-06-19 IMAGING — MG MAMMOGRAPHY SCREENING BILATERAL 3[PERSON_NAME]
8 series · 8 of 24 positions shown · non-contrast
Comparison: Comparison was made to prior examinations.

________________________________________________________________________________________________ 
MAMMOGRAPHY SCREENING BILATERAL 3OURARI TIGER, 06/19/2022 [DATE]: 
CLINICAL INDICATION: Screening mammogram.
TECHNIQUE: Digital bilateral mammograms and 3-D Tomosynthesis were obtained. 
These were interpreted both primarily and with the aid of computer-aided 
detection system.  
BREAST DENSITY: (Level B) There are scattered areas of fibroglandular density.

[L CC]
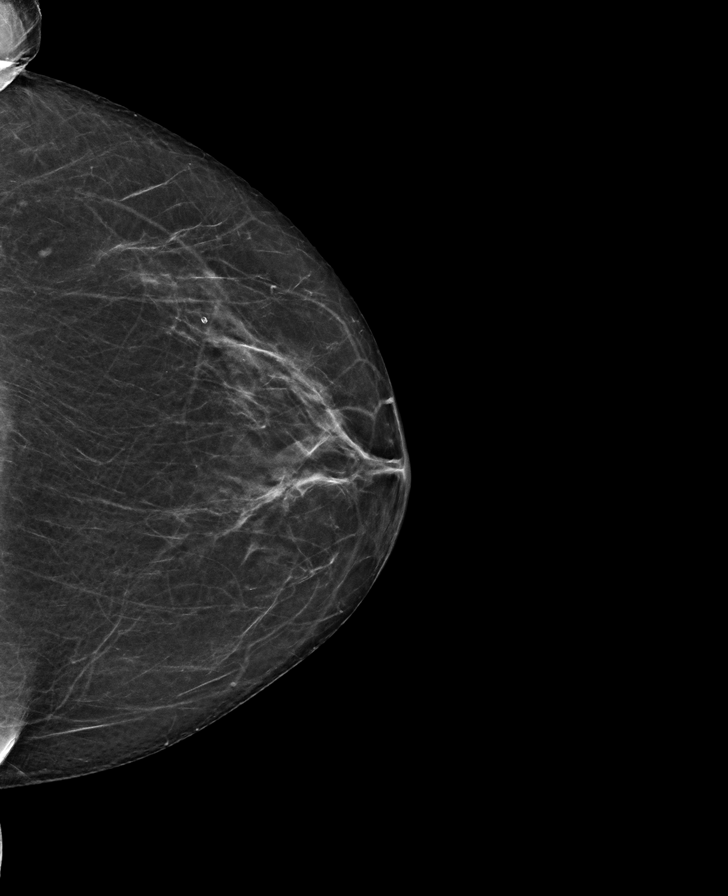

[R CC]
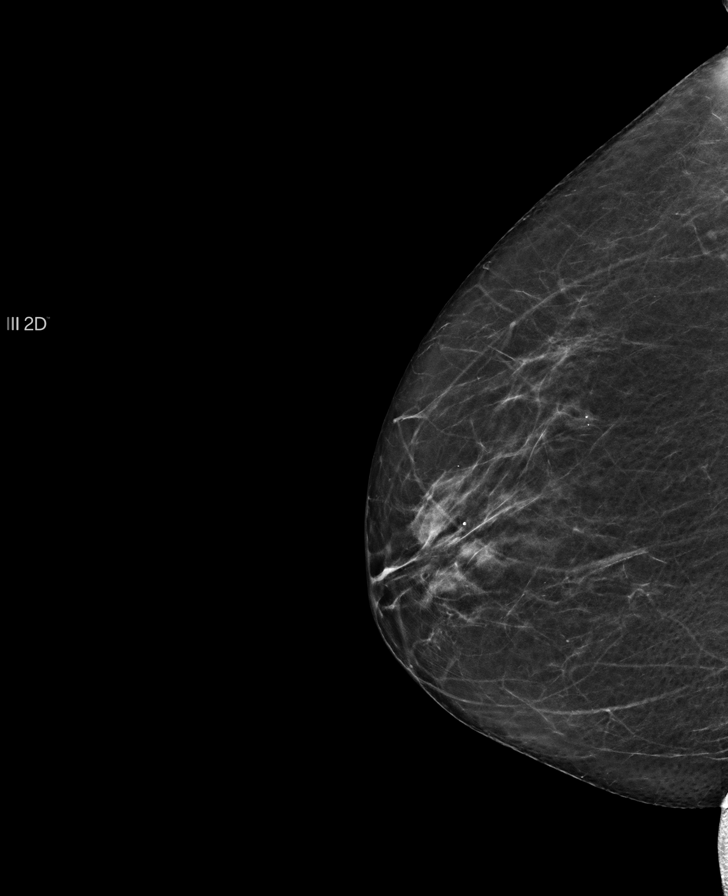

[L MLO]
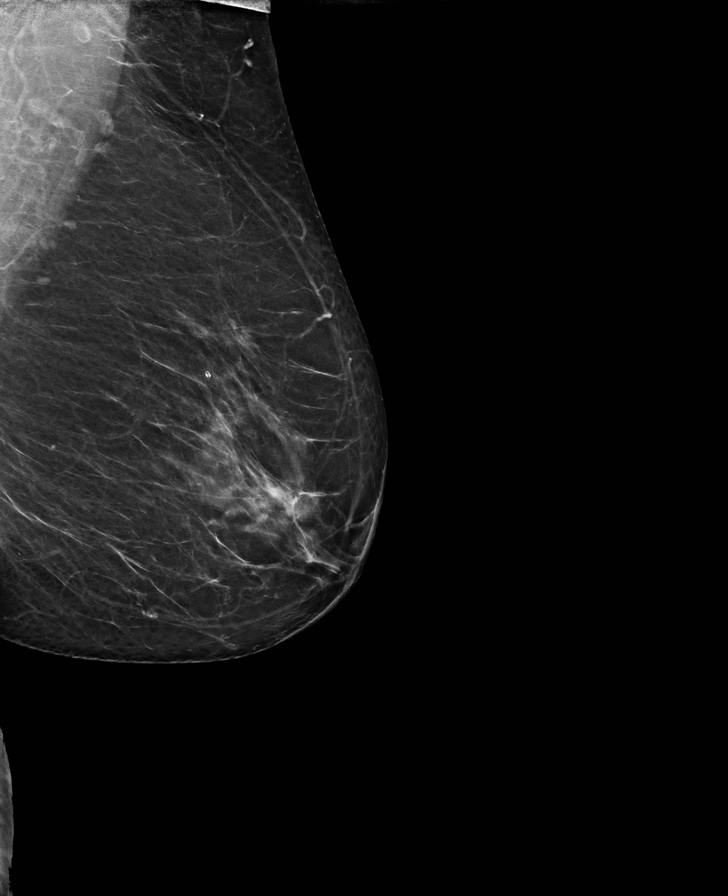

[R MLO]
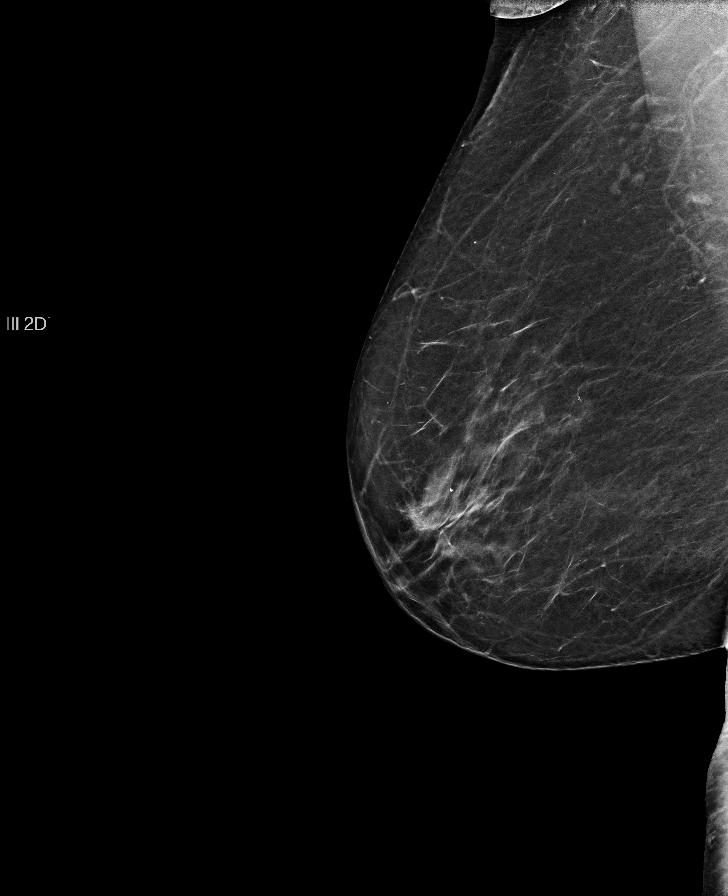

[L CC tomo · tomo slice 29/57.0]
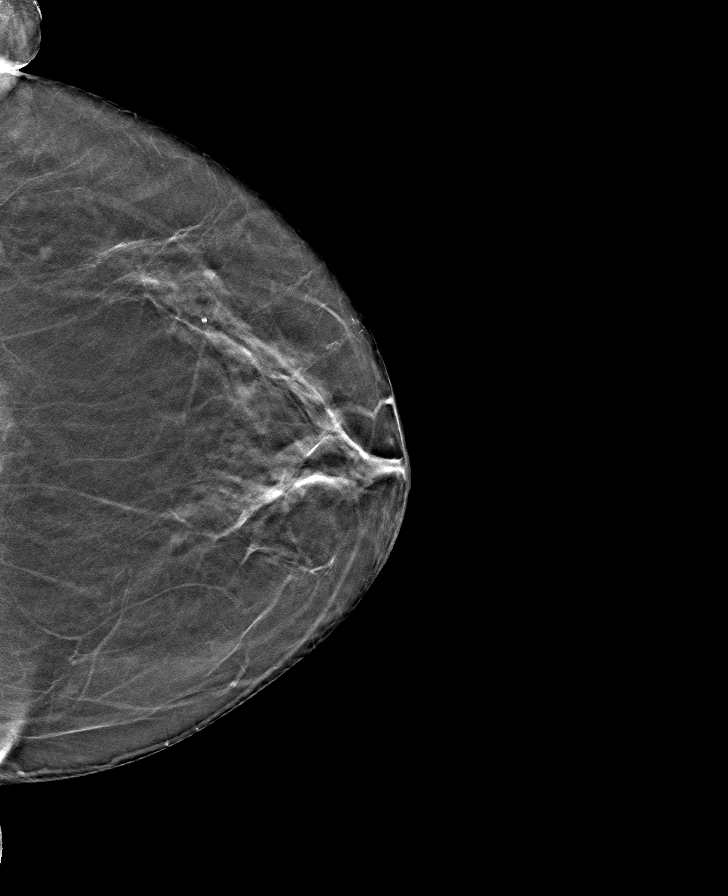

[L MLO tomo · tomo slice 33/66.0]
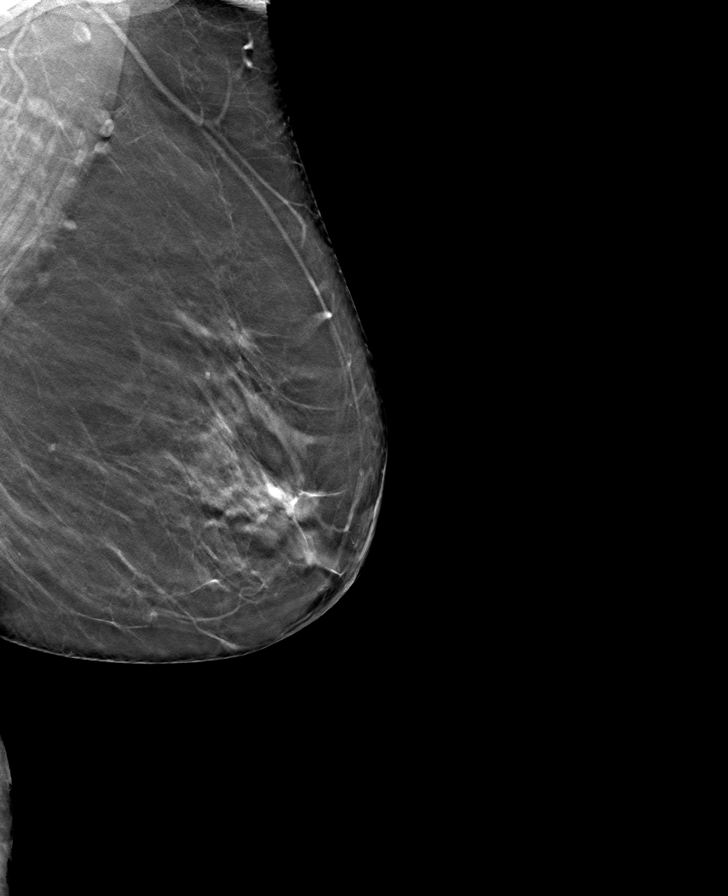

[R CC tomo · tomo slice 29/56.0]
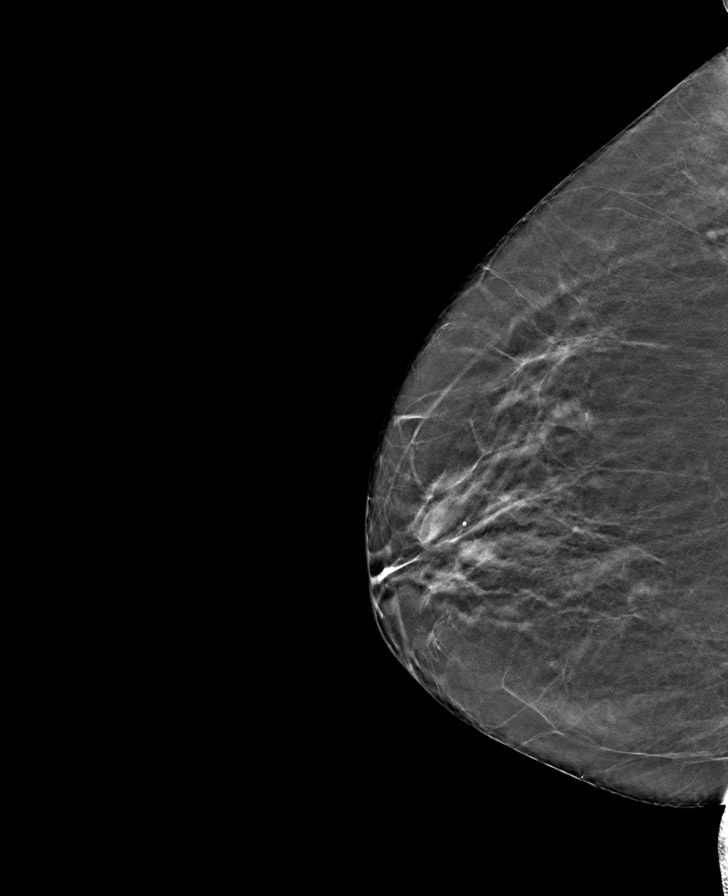

[R MLO tomo · tomo slice 33/66.0]
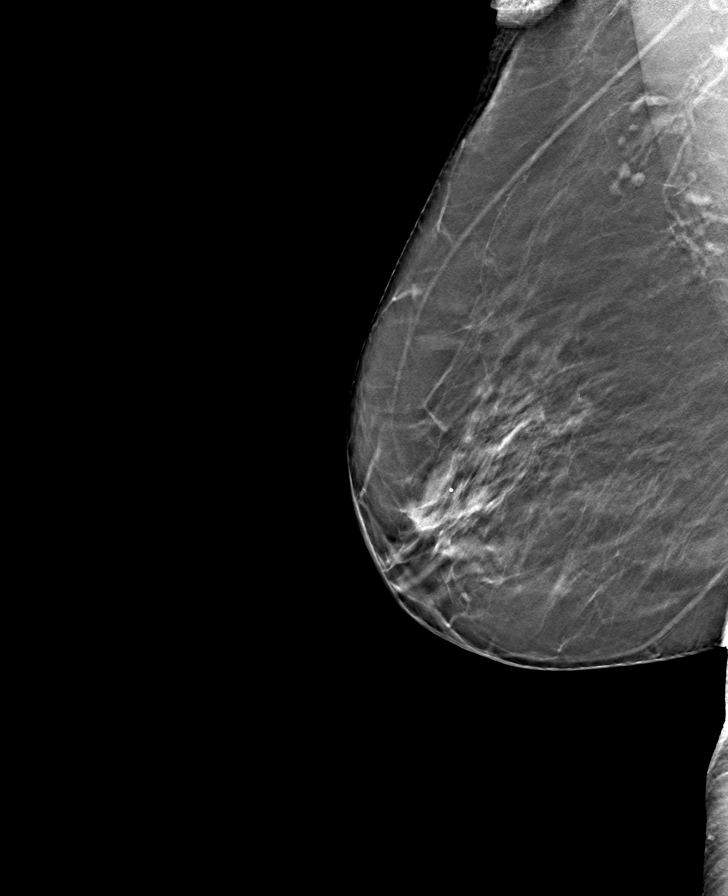

[8 of 24 positions shown; findings below may reference images not displayed]

FINDINGS: No mammographically suspicious abnormality and no significant change from prior 
mammograms.
IMPRESSION: (BI-RADS 1) Negative mammogram. Routine mammographic follow-up is recommended.

## 2022-06-21 IMAGING — DX CHEST PA AND LATERAL
1 series · 2 of 2 positions shown · non-contrast
Comparison: CT of the chest of 05/22/2022. CT of the abdomen of 05/22/2022.

________________________________________________________________________________________________ 
CLINICAL INDICATION: Cough, Unspecified.

[Series 1: PA · 0.14mm/px · 2 of 2 slices shown]
[im 1/2]
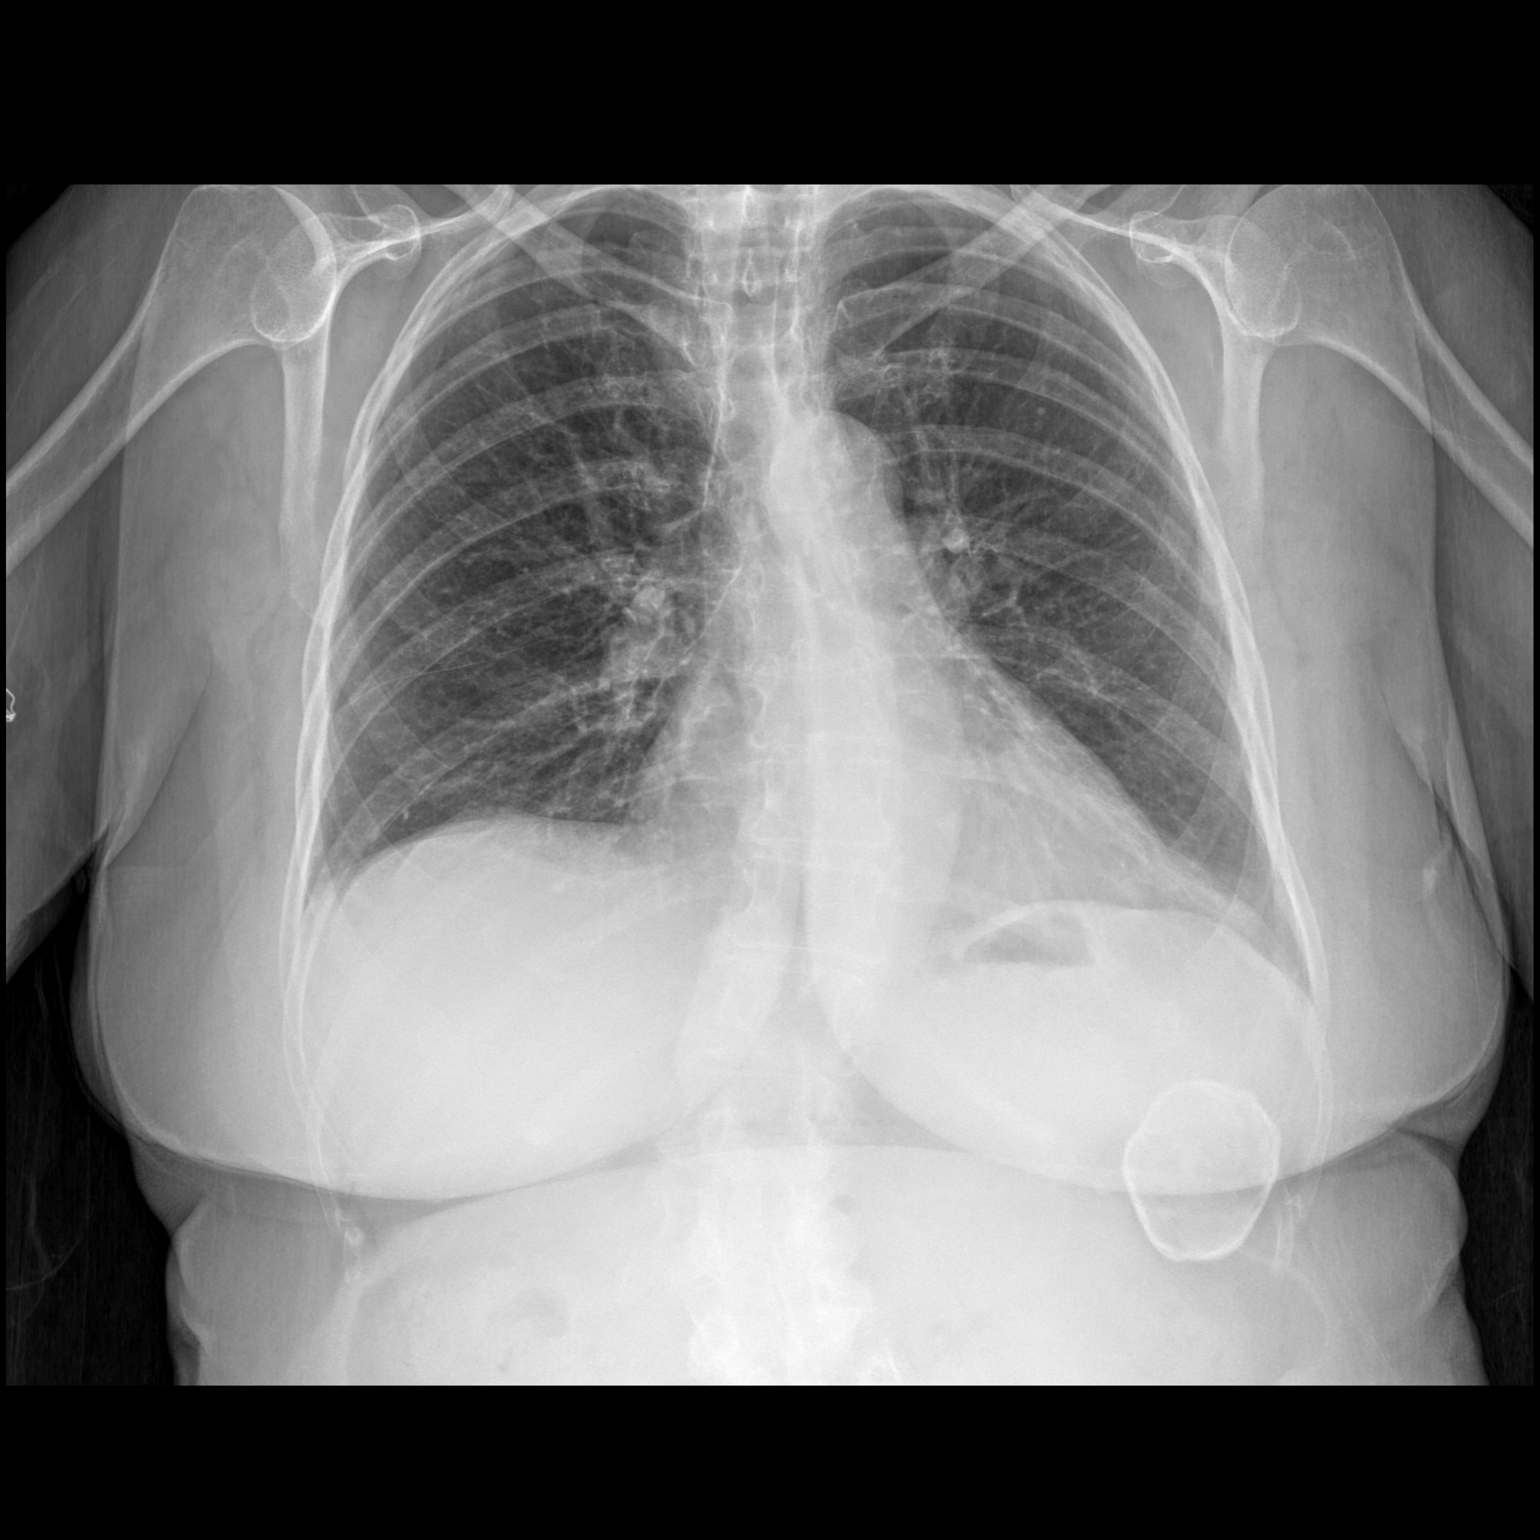
[im 2/2]
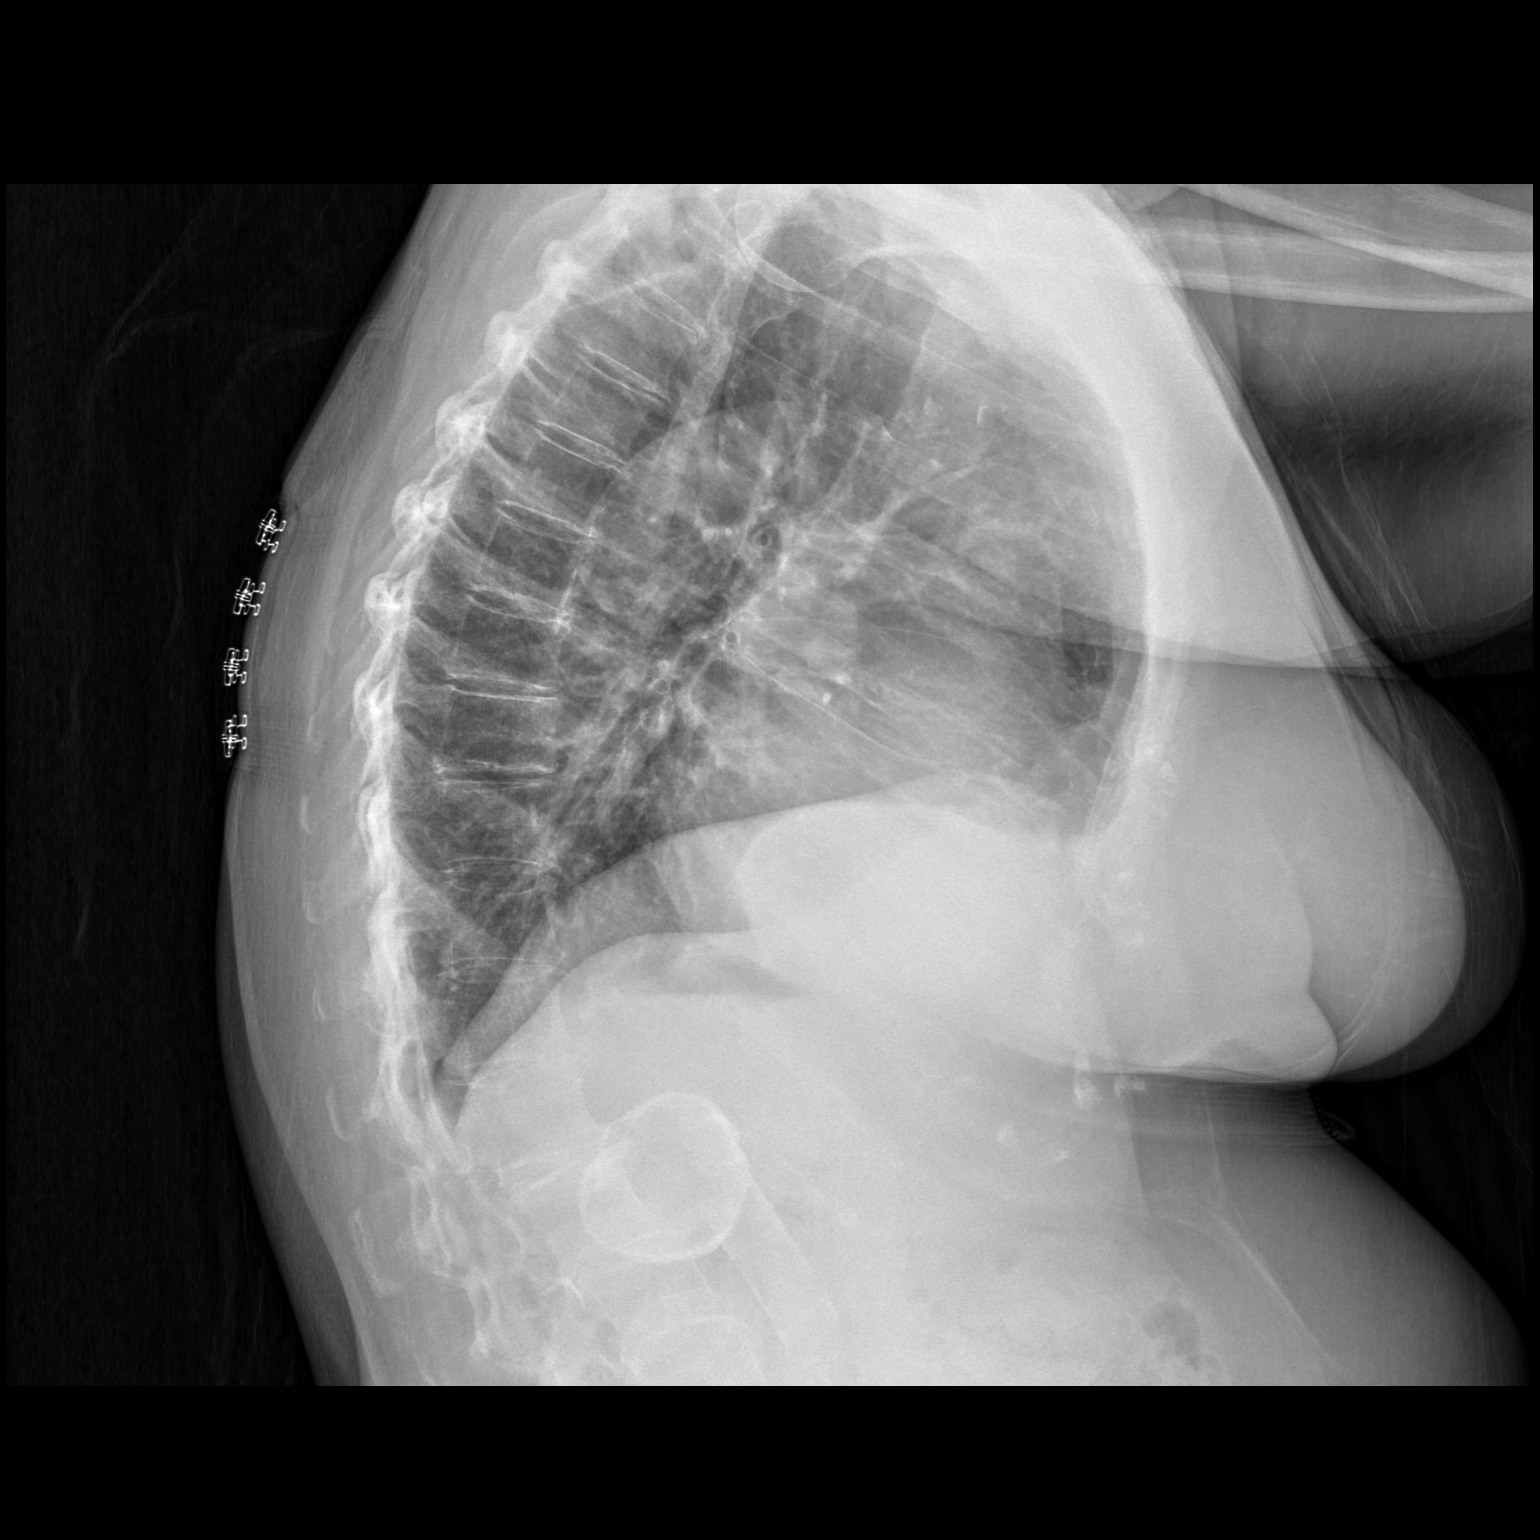

[2 of 2 positions shown; findings below may reference images not displayed]

FINDINGS: Lungs are clear. No consolidation. No effusion. Normal cardiac size 
and pulmonary vascularity. No pneumothorax. No acute osseous abnormality. 
Splenic 5 cm calcification is again seen.
IMPRESSION: No acute cardiopulmonary findings.

## 2022-09-20 IMAGING — DX KNEE 3 VIEWS RIGHT
1 series · 2 of 2 positions shown · non-contrast
Comparison: 05/27/2019 left knee radiographs

________________________________________________________________________________________________ 
KNEE 3 VIEWS RIGHT, KNEE 3 VIEWS LEFT, 09/20/2022 [DATE]: 
CLINICAL INDICATION: Knee joint pain.

[Series 1: AP · 0.14mm/px · 2 of 2 slices shown]
[im 1/2]
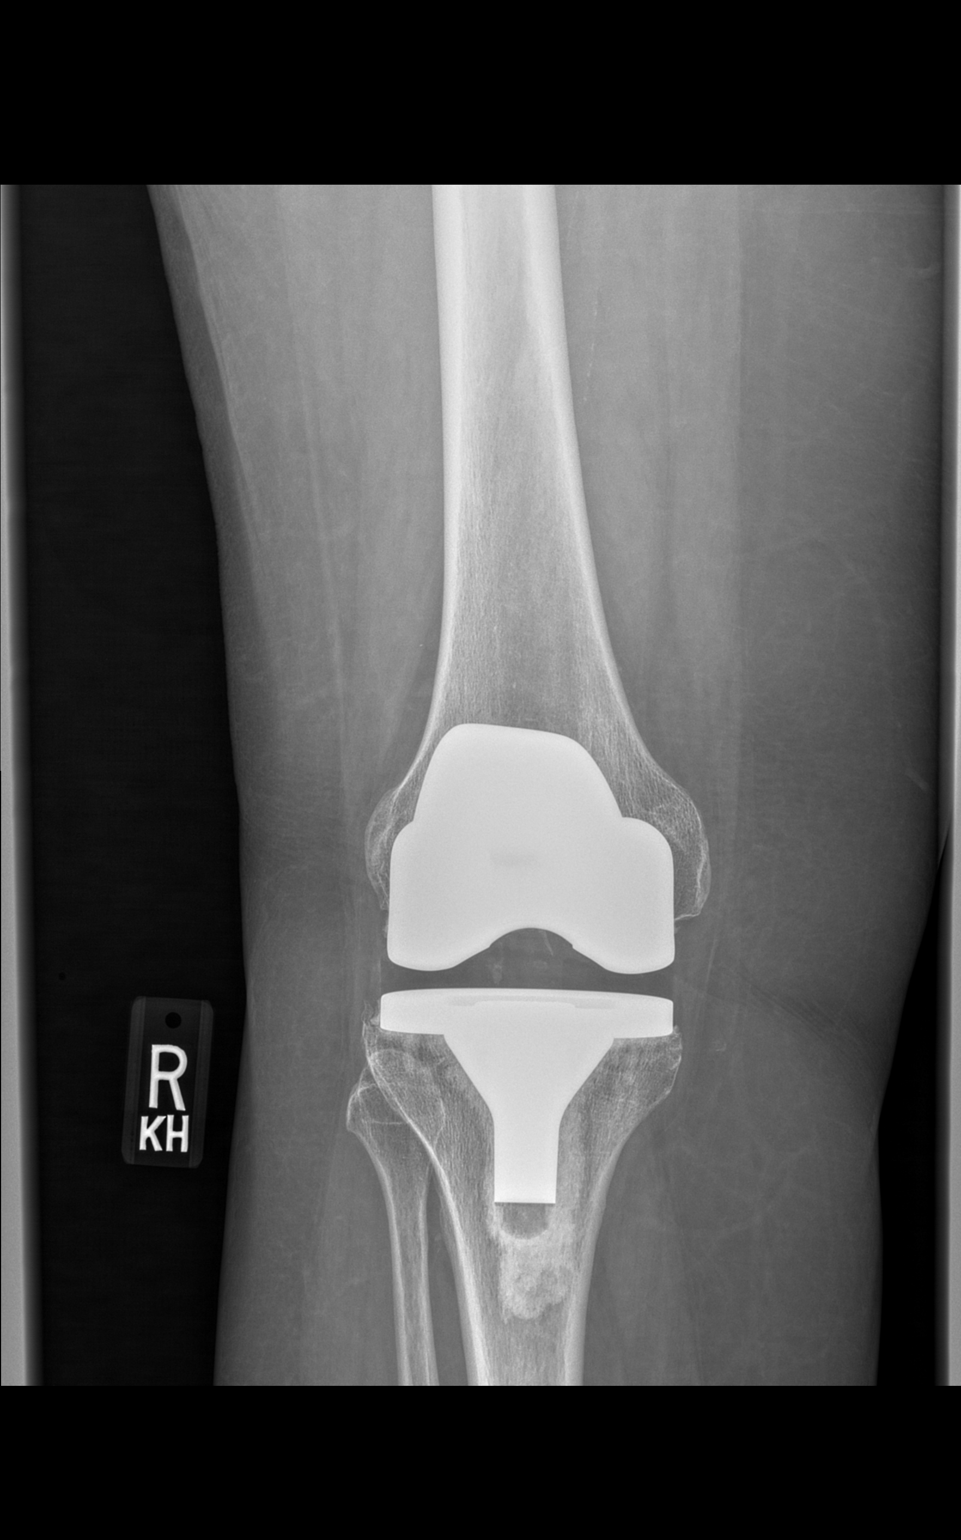
[im 2/2]
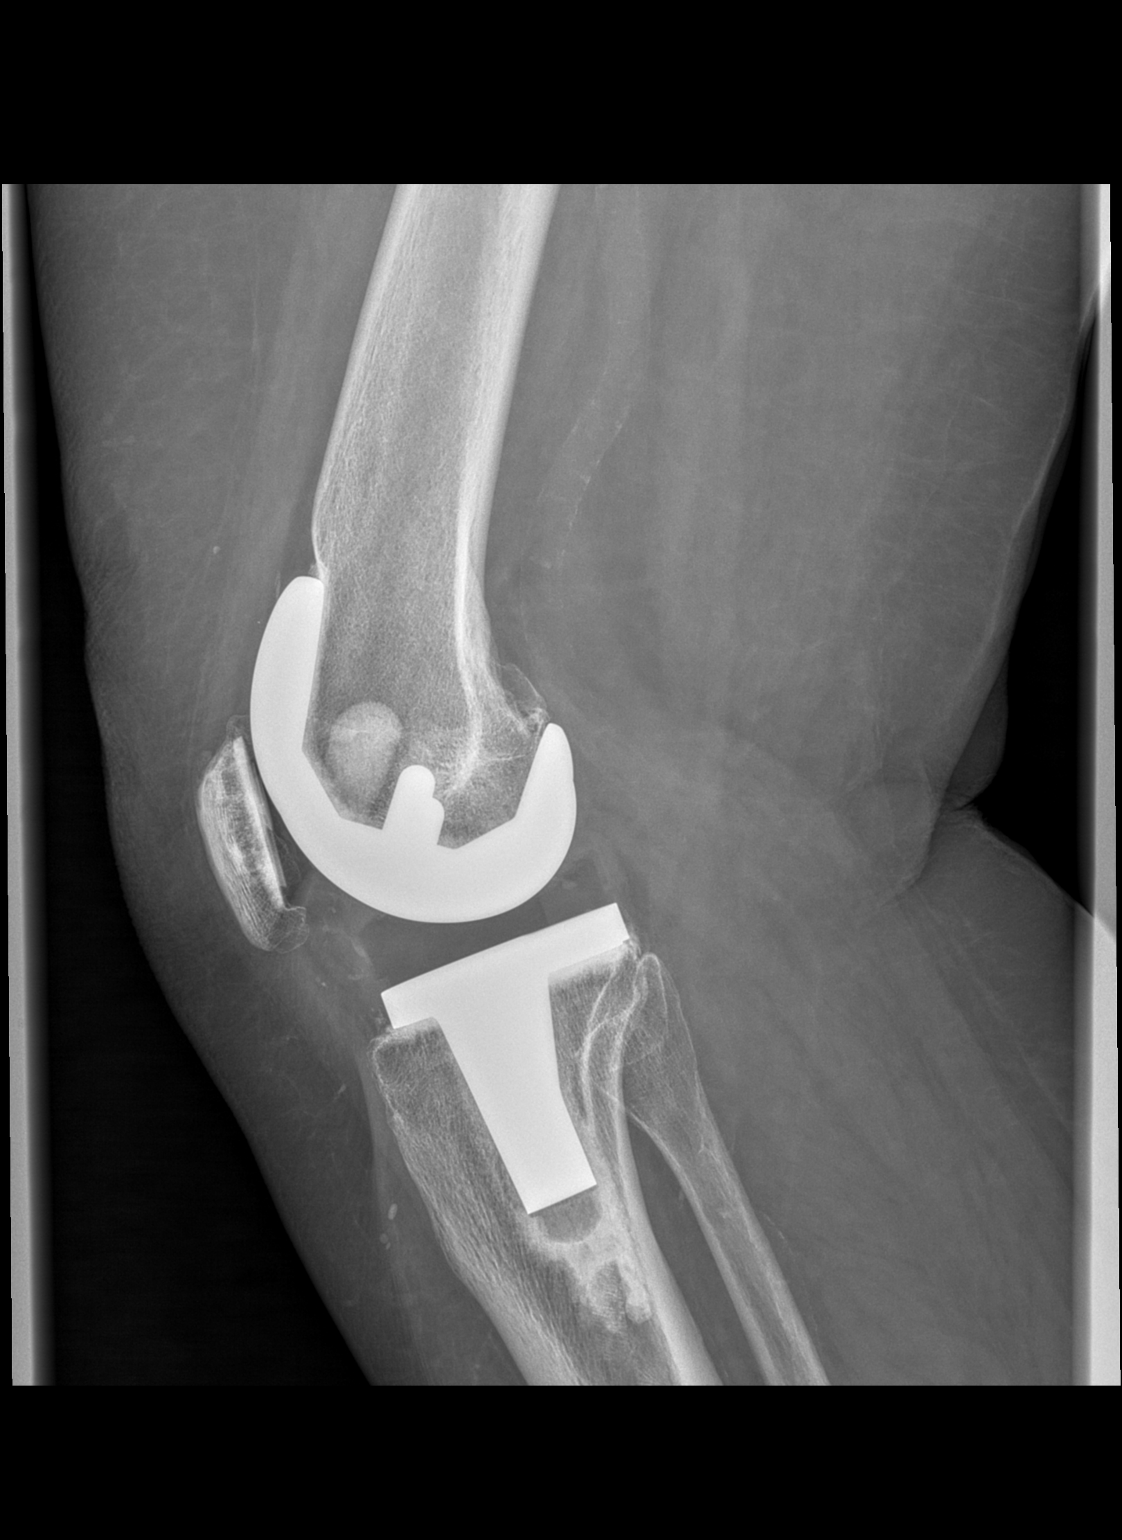

[2 of 2 positions shown; findings below may reference images not displayed]

FINDINGS: No fracture. Normal alignment. Bilateral cemented total knee 
arthroplasties are well seated (left is new since prior study). New minimally 
increased density in the left distal femoral shaft, may be sequela of reaming 
artifact. No effusion. Atherosclerosis. Normal bone mineralization.
IMPRESSION: Bilateral cemented total knee arthroplasties are well seated.

## 2023-03-28 ENCOUNTER — Emergency Department: Payer: Medicare Other

## 2023-03-28 ENCOUNTER — Emergency Department
Admission: EM | Admit: 2023-03-28 | Discharge: 2023-03-28 | Disposition: A | Discharge status: Home / Self Care | Payer: Medicare Other | Attending: Emergency Medical Services | Admitting: Emergency Medical Services

## 2023-03-28 DIAGNOSIS — I160 Hypertensive urgency: Secondary | ICD-10-CM | POA: Insufficient documentation

## 2023-03-28 DIAGNOSIS — R21 Rash and other nonspecific skin eruption: Secondary | ICD-10-CM

## 2023-03-28 DIAGNOSIS — I10 Essential (primary) hypertension: Secondary | ICD-10-CM

## 2023-03-28 DIAGNOSIS — I161 Hypertensive emergency: Secondary | ICD-10-CM

## 2023-03-28 HISTORY — DX: Hyperlipidemia, unspecified: E78.5

## 2023-03-28 HISTORY — DX: Gastro-esophageal reflux disease without esophagitis: K21.9

## 2023-03-28 HISTORY — DX: Essential (primary) hypertension: I10

## 2023-03-28 HISTORY — DX: Disorder of thyroid, unspecified: E07.9

## 2023-03-28 LAB — CBC AND DIFFERENTIAL
Absolute NRBC: 0 10*3/uL (ref 0.00–0.00)
Basophils Absolute Automated: 0.05 10*3/uL (ref 0.00–0.08)
Basophils Automated: 0.8 %
Eosinophils Absolute Automated: 0.21 10*3/uL (ref 0.00–0.44)
Eosinophils Automated: 3.2 %
Hematocrit: 38.1 % (ref 34.7–43.7)
Hgb: 12.5 g/dL (ref 11.4–14.8)
Immature Granulocytes Absolute: 0.02 10*3/uL (ref 0.00–0.07)
Immature Granulocytes: 0.3 %
Instrument Absolute Neutrophil Count: 3.46 10*3/uL (ref 1.10–6.33)
Lymphocytes Absolute Automated: 2.25 10*3/uL (ref 0.42–3.22)
Lymphocytes Automated: 34.5 %
MCH: 28.2 pg (ref 25.1–33.5)
MCHC: 32.8 g/dL (ref 31.5–35.8)
MCV: 86 fL (ref 78.0–96.0)
MPV: 10.4 fL (ref 8.9–12.5)
Monocytes Absolute Automated: 0.54 10*3/uL (ref 0.21–0.85)
Monocytes: 8.3 %
Neutrophils Absolute: 3.46 10*3/uL (ref 1.10–6.33)
Neutrophils: 52.9 %
Nucleated RBC: 0 /100 WBC (ref 0.0–0.0)
Platelets: 313 10*3/uL (ref 142–346)
RBC: 4.43 10*6/uL (ref 3.90–5.10)
RDW: 14 % (ref 11–15)
WBC: 6.53 10*3/uL (ref 3.10–9.50)

## 2023-03-28 LAB — COMPREHENSIVE METABOLIC PANEL
ALT: 10 U/L (ref 0–55)
AST (SGOT): 18 U/L (ref 5–41)
Albumin/Globulin Ratio: 1.2 (ref 0.9–2.2)
Albumin: 3.8 g/dL (ref 3.5–5.0)
Alkaline Phosphatase: 63 U/L (ref 37–117)
Anion Gap: 10 (ref 5.0–15.0)
BUN: 7 mg/dL (ref 7.0–21.0)
Bilirubin, Total: 0.5 mg/dL (ref 0.2–1.2)
CO2: 27 mEq/L (ref 17–29)
Calcium: 9.4 mg/dL (ref 7.9–10.2)
Chloride: 100 mEq/L (ref 99–111)
Creatinine: 0.7 mg/dL (ref 0.4–1.0)
Globulin: 3.3 g/dL (ref 2.0–3.6)
Glucose: 99 mg/dL (ref 70–100)
Potassium: 3.7 mEq/L (ref 3.5–5.3)
Protein, Total: 7.1 g/dL (ref 6.0–8.3)
Sodium: 137 mEq/L (ref 135–145)
eGFR: >60 mL/min/{1.73_m2} (ref >=60)

## 2023-03-28 LAB — HIGH SENSITIVITY TROPONIN-I: hs Troponin-I: 3.5 ng/L

## 2023-03-28 MED ORDER — HYDRALAZINE HCL 20 MG/ML IJ SOLN
10.0000 mg | Freq: Once | INTRAMUSCULAR | Status: AC
Start: 2023-03-28 — End: 2023-03-28
  Administered 2023-03-28: 10 mg via INTRAVENOUS
  Filled 2023-03-28: qty 1

## 2023-03-28 MED ORDER — HYDROCORTISONE 2.5 % EX CREA
TOPICAL_CREAM | Freq: Two times a day (BID) | CUTANEOUS | 0 refills | Status: DC
Start: 2023-03-28 — End: 2023-05-05

## 2023-03-28 MED ORDER — HYDROCORTISONE 2.5 % EX CREA
TOPICAL_CREAM | Freq: Two times a day (BID) | CUTANEOUS | 0 refills | Status: DC
Start: 2023-03-28 — End: 2023-03-28

## 2023-03-28 MED ORDER — ACETAMINOPHEN 500 MG PO TABS
1000.0000 mg | ORAL_TABLET | Freq: Once | ORAL | Status: AC
Start: 2023-03-28 — End: 2023-03-28
  Administered 2023-03-28: 1000 mg via ORAL
  Filled 2023-03-28: qty 2

## 2023-03-28 NOTE — ED Notes (Addendum)
I have briefly evaluated this patient as triage physician in order to facilitate and initiate the ordering of laboratory and imaging studies as needed.       Brief HPI: No additional history that is not provided by nursing triage note.    Vitals:    03/28/23 1533   BP: (!) 225/100   Pulse: 71   Resp: 22   Temp: 97.9 F (36.6 C)   SpO2: 96%       Orders Placed This Encounter    CBC and differential    Comprehensive metabolic panel    ECG 12 lead          Domingo Pulse, DO     3:33 PM         Domingo Pulse, DO  03/28/23 1532       Domingo Pulse, DO  03/28/23 1533

## 2023-03-28 NOTE — ED Triage Notes (Signed)
Pt states that she was at a doctor's office being seen for rash, pt states that they took her BP and she consistently had a systolic BP in the 260s, pt denies headache, N/V, or vision changes

## 2023-03-28 NOTE — ED Provider Notes (Signed)
EMERGENCY DEPARTMENT HISTORY AND PHYSICAL EXAM    Date: 03/28/23  Patient Name: Becky Burton  Attending Physician: Blanche East, MD  Patient DOB:  [birth year]  MRN:  [id number]  Room:  18/B18        History of Presenting Illness     Chief Complaint:    Chief Complaint   Patient presents with    Hypertension       Historian: Patient    77 y.o. female   Location: Skin  Onset: At rest  Severity: Moderate  Duration: Unknown  Associated signs/symptoms:     77 year old female presents with elevated blood pressure.  Patient has history of hypertension and is on several medications for this.  She has developed a itchy rash over her arms and legs over the last few days she went to an urgent care where they found her blood pressure to be very high so was sent to the ED.  Blood pressure is 225/100.  She has no headache chest pain palpitations does not feel lightheaded or dizzy.  She is traveling from [place] and has noted local doctors        PMD: Pcp, Kathreen Cosier, MD    Past Medical History     Past Medical History:   Diagnosis Date    Disorder of thyroid     Gastroesophageal reflux disease     Hyperlipidemia     Hypertension        Past Surgical History     History reviewed. No pertinent surgical history.    Family History     History reviewed. No pertinent family history.    Social History     Social History     Occupational History    Not on file   Tobacco Use    Smoking status: Never    Smokeless tobacco: Never   Vaping Use    Vaping status: Never Used   Substance and Sexual Activity    Alcohol use: Never    Drug use: Never    Sexual activity: Not on file       Allergies     Allergies   Allergen Reactions    Avelox [Moxifloxacin]     Bisoprolol     Ditropan [Oxybutynin]     Elavil [Amitriptyline]     Hydrochlorothiazide     Methylprednisolone     Sucralfate     Sulfa Antibiotics        Review of Systems       Constitutional:  No fever  Eyes: No discharge   ENT: No ST  CV:  No CP   Resp:  No SOB or cough  GI: No abd  pain, N, V, D  GU: No dysuria  MS: none  Skin: pos rash  Neuro:  No HA  Psych:  No behavior changes  All other systems reviewed and negative    Physical Exam     CONSTITUTIONAL Patient is afebrile, Vital signs reviewed.  HEAD Atraumatic, Normocephalic.  EYES No discharge from eyes, Sclera are normal.  NECK   Normal ROM, Cervical spine nontender  RESPIRATORY CHEST Chest is nontender, Breath sounds normal, No respiratory distress.  CARDIOVASCULAR RRR, Heart sounds normal.  ABDOMEN Abdomen is nontender, No peritoneal signs, No distension  BACK   There is no CVA tenderness, There is no tenderness to palpation  UPPER EXTREMITY No cyanosis, No edema  LOWER EXTREMITY No cyanosis, No edema  NEURO GCS is 15, No focal motor deficits, No focal sensory deficits.  SKIN Skin is warm, Skin is dry.  Rash is slightly petechial and vesicular however no purulent drainage it is present to bilateral legs and chest  PSYCHIATRIC Normal affect, Normal insight       Monitors, EKG     Cardiac Monitor (interpreted by ED physician):      EKG (interpreted by ED physician): NSR 62 no ST elevations  I independently interpreted the EKG.       Orders Placed During This Encounter     Orders Placed This Encounter   Procedures    XR Chest  AP Portable    CT Head without Contrast    CBC and differential    Comprehensive metabolic panel    High Sensitivity Troponin-I    ECG 12 lead         ED Medications Administered     ED Medication Orders (From admission, onward)      Start Ordered     Status Ordering Provider    03/28/23 1700 03/28/23 1659  acetaminophen (TYLENOL) tablet 1,000 mg  Once        Route: Oral  Ordered Dose: 1,000 mg       Last MAR action: Given Spiro Ausborn L    03/28/23 1544 03/28/23 1543  hydrALAZINE (APRESOLINE) injection 10 mg  Once        Route: Intravenous  Ordered Dose: 10 mg       Last MAR action: Given Noha Karasik, Kellogg L            Data Review   Nursing Records Reviewed and Agree: Yes      Laboratory results reviewed by ED provider:  if applicable yes  Radiologic study results reviewed by ED provider:  If applicable yes    Rendering Provider: Blanche East, MD      Diagnostic Study Results     Labs     Results       Procedure Component Value Units Date/Time    High Sensitivity Troponin-I [[id number]] Collected: 03/28/23 1603    Specimen: Blood Updated: 03/28/23 1652     hs Troponin-I 3.5 ng/L     Comprehensive metabolic panel [[id number]] Collected: 03/28/23 1603    Specimen: Blood Updated: 03/28/23 1634     Glucose 99 mg/dL      BUN 7.0 mg/dL      Creatinine 0.7 mg/dL      Sodium 782 mEq/L      Potassium 3.7 mEq/L      Chloride 100 mEq/L      CO2 27 mEq/L      Calcium 9.4 mg/dL      Protein, Total 7.1 g/dL      Albumin 3.8 g/dL      AST (SGOT) 18 U/L      ALT 10 U/L      Alkaline Phosphatase 63 U/L      Bilirubin, Total 0.5 mg/dL      Globulin 3.3 g/dL      Albumin/Globulin Ratio 1.2     Anion Gap 10.0     eGFR >60.0 mL/min/1.73 m2     CBC and differential [[id number]] Collected: 03/28/23 1603    Specimen: Blood Updated: 03/28/23 1621     WBC 6.53 x10 3/uL      Hgb 12.5 g/dL      Hematocrit 57.8 %      Platelets 313 x10 3/uL      RBC 4.43 x10 6/uL      MCV 86.0 fL  MCH 28.2 pg      MCHC 32.8 g/dL      RDW 14 %      MPV 10.4 fL      Instrument Absolute Neutrophil Count 3.46 x10 3/uL      Neutrophils 52.9 %      Lymphocytes Automated 34.5 %      Monocytes 8.3 %      Eosinophils Automated 3.2 %      Basophils Automated 0.8 %      Immature Granulocytes 0.3 %      Nucleated RBC 0.0 /100 WBC      Neutrophils Absolute 3.46 x10 3/uL      Lymphocytes Absolute Automated 2.25 x10 3/uL      Monocytes Absolute Automated 0.54 x10 3/uL      Eosinophils Absolute Automated 0.21 x10 3/uL      Basophils Absolute Automated 0.05 x10 3/uL      Immature Granulocytes Absolute 0.02 x10 3/uL      Absolute NRBC 0.00 x10 3/uL             Radiologic Studies  Radiology Results (24 Hour)       Procedure Component Value Units Date/Time    CT Head without Contrast [[id number]]  Collected: 03/28/23 1731    Order Status: Completed Updated: 03/28/23 1739    Narrative:      HISTORY: Headache.     COMPARISON: None available.    TECHNIQUE: CT of the head performed without intravenous contrast. The  following dose reduction techniques were utilized: automated exposure  control and/or adjustment of the mA and/or KV according to patient size,  and the use of an iterative reconstruction technique.    CONTRAST: None.    FINDINGS:  The ventricles and sulci are mildly enlarged, consistent with generalized  parenchymal volume loss. The ventricles are symmetric and the basal  cisterns are patent. Scattered periventricular, deep and subcortical white  matter hypoattenuation is nonspecific, but most likely reflects the sequela  of chronic small vessel ischemic change. No intracranial hemorrhage,  extra-axial fluid collection, midline shift or mass-effect is evident. No  CT evidence of acute large vessel or territorial ischemia. Vascular  calcifications indicate intracranial atherosclerosis.    The visualized paranasal sinuses and mastoid air cells are clear.      Impression:           1. No CT evidence of acute intracranial hemorrhage, herniation or  hydrocephalus.       2. Cerebral volume loss and sequela of chronic ischemic disease.    Neldon Mc, MD  03/28/2023 5:37 PM    XR Chest  AP Portable [[id number]] Collected: 03/28/23 1603    Order Status: Completed Updated: 03/28/23 1606    Narrative:      HISTORY: Fever.     COMPARISON: None.    FINDINGS:   Low lung volume. Normal cardiac size. No pneumothorax, consolidation, or  pleural effusion. No evidence of pulmonary edema. A 4.5 cm calcification  projecting in the left upper quadrant of the abdomen is probably a benign  calcified splenic cyst.      Impression:        No acute abnormal findings. No evidence of pneumonia.    Wynema Birch, MD  03/28/2023 4:04 PM        .        Procedures         Clinical Notes, Consults & Reevaluations, and Medical  Decision Making     For a  more detailed timeline, please see ED Patient Care timeline in Epic Chart    Medical Decision Making Evaluation Section/ED course:   History, Vital signs and Physical Exam, labs imaging were done to evaluate the patient as part of the Medical Decision Making. I reviewed EMS and/or nursing home reports as part of patient's care.    Differential Diagnoses Considered as part of Medical Decision Making:   3:43 PM Early differential includes, but is not limited to: Hypertensive emergency.  Electrolyte abnormality.  Viral exanthem contact dermatitis    Re-eval of Patient and Interventions/Treatments Performed and Consults Performed with Specialists:  3:46 PM I have ordered labs imaging medications eval and treat patient symptoms.  I ordered hydralazine for patient's very high blood pressure    I have ordered and intrepreted the following labs, imaging and data as part of my HIGH complexity medical decision making and reasoning:   Recent Labs     03/28/23  1603   WBC 6.53   Hgb 12.5   Glucose 99   hs Troponin-I 3.5   AST (SGOT) 18   ALT 10   Bilirubin, Total 0.5        Lab evaluation medical decision making and reasoning:  I ordered the complete blood count (CBC) so I could check the white blood cell count (WBC) to evaluate for signs of infection.  I also tested the hemoglobin to evaluate for blood loss.  I ordered the metabolic panel (BMP/CMP) so I could evaluate the various electrolytes as well as the blood sugar  I ordered the liver function tests (LFTS) to evaluate the liver ability  I ordered the troponin level (Trop) to evaluate for any heart injury    Imaging Studies Performed and intrepreted as part of my HIGH complexity medical decision making and reasoning:  Imaging evaluation medical decision making and reasoning:  I ordered the chest x-ray to evaluate for pneumonia or pneumothorax as well as other illness.    I independently intrepreted the xrays/cat scans and agree with the  radiologists readings.   CT Head without Contrast   Final Result          1. No CT evidence of acute intracranial hemorrhage, herniation or   hydrocephalus.         2. Cerebral volume loss and sequela of chronic ischemic disease.      Neldon Mc, MD   03/28/2023 5:37 PM      XR Chest  AP Portable   Final Result      No acute abnormal findings. No evidence of pneumonia.      Wynema Birch, MD   03/28/2023 4:04 PM          Continued ED Reevaluation, Consults Performed with Specialists and Continued Medical Decision Making Process:  ED Course as of 03/28/23 1741   Fri Mar 28, 2023   1700 Patient has improved blood pressure when she went to go to the bathroom she started having a headache.  No other neurologic symptoms [JC]      ED Course User Index  [JC] Harden Mo, MD     5:40 PM patient's vital signs improved neurologically intact labs imaging negative acute patient will follow-up with PCP return precautions with the patient    Diagnosis and Disposition   Diagnosis/Clinical Impression:  1. Hypertensive emergency    2. Rash        Disposition  ED Disposition       ED Disposition   Discharge  Condition   --    Date/Time   Fri Mar 28, 2023  5:40 PM    Comment   Dan[facility] Bila [facility] discharge to home/self care.    Condition at disposition: Stable                 Prescriptions    New Prescriptions    HYDROCORTISONE 2.5 % CREAM    Apply topically 2 (two) times daily             Critical Care     Critical care exclusive of time spent performing procedures.    Total time: 80 mins    80 minutes of critical care time, excluding procedures, with high imminent risk for threat to life or limb with the following systems at risk:  Malignant hypertension  Activities of critical care included:  Consults, Discussion with patient/family, Review of prior records, Review of results in the ED, Bedside evaluation and direction of care, High risk medication administration or other resuscitation, Respiratory support, and Close  observation and re-evaluations      Patient has very high blood pressure which can be a life-threatening serious condition        Signout If Applicable     Patient signed out to:      Signout notes:          This note was generated by the Epic EMR system/ Dragon speech recognition and may contain inherent errors or omissions not intended by the user. Grammatical errors, random word insertions, deletions, pronoun errors and incomplete sentences are occasional consequences of this technology due to software limitations. Not all errors are caught or corrected. If there are questions or concerns about the content of this note or information contained within the body of this dictation they should be addressed directly with the author for clarification.       Harden Mo, MD  03/28/23 [phone]

## 2023-03-29 LAB — ECG 12-LEAD
Atrial Rate: 62 {beats}/min
IHS MUSE NARRATIVE AND IMPRESSION: NORMAL
P Axis: 54 degrees
P-R Interval: 160 ms
Q-T Interval: 416 ms
QRS Duration: 86 ms
QTC Calculation (Bezet): 422 ms
R Axis: 55 degrees
T Axis: 181 degrees
Ventricular Rate: 62 {beats}/min

## 2023-04-09 ENCOUNTER — Other Ambulatory Visit (INDEPENDENT_AMBULATORY_CARE_PROVIDER_SITE_OTHER): Payer: Self-pay | Admitting: Internal Medicine

## 2023-05-02 ENCOUNTER — Inpatient Hospital Stay
Admission: EM | Admit: 2023-05-02 | Discharge: 2023-05-08 | DRG: 291 | Disposition: A | Discharge status: Home / Self Care | Payer: Medicare Other | Attending: Internal Medicine | Admitting: Internal Medicine

## 2023-05-02 ENCOUNTER — Emergency Department: Payer: Medicare Other

## 2023-05-02 DIAGNOSIS — E7800 Pure hypercholesterolemia, unspecified: Secondary | ICD-10-CM | POA: Diagnosis present

## 2023-05-02 DIAGNOSIS — I5033 Acute on chronic diastolic (congestive) heart failure: Secondary | ICD-10-CM | POA: Diagnosis present

## 2023-05-02 DIAGNOSIS — E876 Hypokalemia: Secondary | ICD-10-CM | POA: Diagnosis not present

## 2023-05-02 DIAGNOSIS — E669 Obesity, unspecified: Secondary | ICD-10-CM | POA: Diagnosis present

## 2023-05-02 DIAGNOSIS — K219 Gastro-esophageal reflux disease without esophagitis: Secondary | ICD-10-CM | POA: Diagnosis present

## 2023-05-02 DIAGNOSIS — I959 Hypotension, unspecified: Secondary | ICD-10-CM | POA: Diagnosis not present

## 2023-05-02 DIAGNOSIS — I509 Heart failure, unspecified: Principal | ICD-10-CM

## 2023-05-02 DIAGNOSIS — E871 Hypo-osmolality and hyponatremia: Secondary | ICD-10-CM | POA: Diagnosis present

## 2023-05-02 DIAGNOSIS — R0600 Dyspnea, unspecified: Secondary | ICD-10-CM | POA: Diagnosis present

## 2023-05-02 DIAGNOSIS — I700 Atherosclerosis of aorta: Secondary | ICD-10-CM | POA: Diagnosis present

## 2023-05-02 DIAGNOSIS — J90 Pleural effusion, not elsewhere classified: Secondary | ICD-10-CM

## 2023-05-02 DIAGNOSIS — D696 Thrombocytopenia, unspecified: Secondary | ICD-10-CM | POA: Diagnosis present

## 2023-05-02 DIAGNOSIS — Z79899 Other long term (current) drug therapy: Secondary | ICD-10-CM

## 2023-05-02 DIAGNOSIS — E039 Hypothyroidism, unspecified: Secondary | ICD-10-CM | POA: Diagnosis present

## 2023-05-02 DIAGNOSIS — Z79890 Hormone replacement therapy (postmenopausal): Secondary | ICD-10-CM

## 2023-05-02 DIAGNOSIS — R7989 Other specified abnormal findings of blood chemistry: Secondary | ICD-10-CM

## 2023-05-02 DIAGNOSIS — I110 Hypertensive heart disease with heart failure: Principal | ICD-10-CM | POA: Diagnosis present

## 2023-05-02 DIAGNOSIS — Z6832 Body mass index (BMI) 32.0-32.9, adult: Secondary | ICD-10-CM

## 2023-05-02 DIAGNOSIS — I272 Pulmonary hypertension, unspecified: Secondary | ICD-10-CM | POA: Diagnosis present

## 2023-05-02 DIAGNOSIS — D513 Other dietary vitamin B12 deficiency anemia: Secondary | ICD-10-CM | POA: Diagnosis present

## 2023-05-02 DIAGNOSIS — D518 Other vitamin B12 deficiency anemias: Secondary | ICD-10-CM | POA: Diagnosis not present

## 2023-05-02 DIAGNOSIS — I10 Essential (primary) hypertension: Secondary | ICD-10-CM | POA: Diagnosis present

## 2023-05-02 DIAGNOSIS — D638 Anemia in other chronic diseases classified elsewhere: Secondary | ICD-10-CM | POA: Diagnosis present

## 2023-05-02 DIAGNOSIS — I2510 Atherosclerotic heart disease of native coronary artery without angina pectoris: Secondary | ICD-10-CM | POA: Diagnosis present

## 2023-05-02 DIAGNOSIS — T502X5A Adverse effect of carbonic-anhydrase inhibitors, benzothiadiazides and other diuretics, initial encounter: Secondary | ICD-10-CM | POA: Diagnosis not present

## 2023-05-02 DIAGNOSIS — R001 Bradycardia, unspecified: Secondary | ICD-10-CM | POA: Diagnosis not present

## 2023-05-02 LAB — LAB USE ONLY - CBC WITH DIFFERENTIAL
Absolute Basophils: 0.05 10*3/uL (ref 0.00–0.08)
Absolute Eosinophils: 0.15 10*3/uL (ref 0.00–0.44)
Absolute Immature Granulocytes: 0.02 10*3/uL (ref 0.00–0.07)
Absolute Lymphocytes: 1.56 10*3/uL (ref 0.42–3.22)
Absolute Monocytes: 0.61 10*3/uL (ref 0.21–0.85)
Absolute Neutrophils: 3.77 10*3/uL (ref 1.10–6.33)
Absolute nRBC: 0.02 10*3/uL — ABNORMAL HIGH (ref <=0.00)
Basophils %: 0.8 %
Eosinophils %: 2.4 %
Hematocrit: 32.5 % — ABNORMAL LOW (ref 34.7–43.7)
Hemoglobin: 10.8 g/dL — ABNORMAL LOW (ref 11.4–14.8)
Immature Granulocytes %: 0.3 %
Lymphocytes %: 25.3 %
MCH: 28.9 pg (ref 25.1–33.5)
MCHC: 33.2 g/dL (ref 31.5–35.8)
MCV: 86.9 fL (ref 78.0–96.0)
MPV: 11.1 fL (ref 8.9–12.5)
Monocytes %: 9.9 %
Neutrophils %: 61.3 %
Platelet Count: 115 10*3/uL — ABNORMAL LOW (ref 142–346)
Preliminary Absolute Neutrophil Count: 3.77 10*3/uL (ref 1.10–6.33)
RBC: 3.74 10*6/uL — ABNORMAL LOW (ref 3.90–5.10)
RDW: 14 % (ref 11–15)
WBC: 6.16 10*3/uL (ref 3.10–9.50)
nRBC %: 0.3 /100 WBC — ABNORMAL HIGH (ref <=0.0)

## 2023-05-02 LAB — COVID-19 (SARS-COV-2) & INFLUENZA  A/B, NAA (ROCHE LIAT)
Influenza A RNA: NOT DETECTED
Influenza B RNA: NOT DETECTED
SARS-CoV-2 (COVID-19) RNA: NOT DETECTED

## 2023-05-02 LAB — COMPREHENSIVE METABOLIC PANEL
ALT: 9 U/L (ref 0–55)
AST (SGOT): 14 U/L (ref 5–41)
Albumin/Globulin Ratio: 1.1 (ref 0.9–2.2)
Albumin: 3.4 g/dL — ABNORMAL LOW (ref 3.5–5.0)
Alkaline Phosphatase: 53 U/L (ref 37–117)
Anion Gap: 8 (ref 5.0–15.0)
BUN: 8 mg/dL (ref 7–21)
Bilirubin, Total: 0.5 mg/dL (ref 0.2–1.2)
CO2: 27 mEq/L (ref 17–29)
Calcium: 9 mg/dL (ref 7.9–10.2)
Chloride: 99 mEq/L (ref 99–111)
Creatinine: 0.7 mg/dL (ref 0.4–1.0)
GFR: >60 mL/min/{1.73_m2} (ref >=60.0)
Globulin: 3.2 g/dL (ref 2.0–3.6)
Glucose: 108 mg/dL — ABNORMAL HIGH (ref 70–100)
Potassium: 3.9 mEq/L (ref 3.5–5.3)
Protein, Total: 6.6 g/dL (ref 6.0–8.3)
Sodium: 134 mEq/L — ABNORMAL LOW (ref 135–145)

## 2023-05-02 LAB — LAB USE ONLY - MORPHOLOGY REVIEW
Platelet Estimate: NORMAL
RBC Morphology: NORMAL

## 2023-05-02 LAB — HIGH SENSITIVITY TROPONIN-I: hs Troponin: 4.1 ng/L (ref <=14.0)

## 2023-05-02 LAB — NT-PROBNP: NT-ProBNP: 2322 pg/mL — ABNORMAL HIGH (ref <450)

## 2023-05-02 MED ORDER — LEVOTHYROXINE SODIUM 112 MCG PO TABS
112.0000 ug | ORAL_TABLET | Freq: Every day | ORAL | Status: DC
Start: 2023-05-03 — End: 2023-05-08
  Administered 2023-05-03 – 2023-05-08 (×6): 112 ug via ORAL
  Filled 2023-05-02 (×6): qty 1

## 2023-05-02 MED ORDER — FAMOTIDINE 20 MG PO TABS
40.0000 mg | ORAL_TABLET | Freq: Every day | ORAL | Status: DC
Start: 2023-05-02 — End: 2023-05-08
  Administered 2023-05-02 – 2023-05-08 (×6): 40 mg via ORAL
  Filled 2023-05-02 (×6): qty 2

## 2023-05-02 MED ORDER — HEPARIN SODIUM (PORCINE) 5000 UNIT/ML IJ SOLN
5000.0000 [IU] | Freq: Two times a day (BID) | INTRAMUSCULAR | Status: DC
Start: 2023-05-02 — End: 2023-05-08
  Administered 2023-05-02 – 2023-05-08 (×12): 5000 [IU] via SUBCUTANEOUS
  Filled 2023-05-02 (×12): qty 1

## 2023-05-02 MED ORDER — PANTOPRAZOLE SODIUM 40 MG PO TBEC
40.0000 mg | DELAYED_RELEASE_TABLET | Freq: Every morning | ORAL | Status: DC
Start: 2023-05-02 — End: 2023-05-08
  Administered 2023-05-02 – 2023-05-08 (×6): 40 mg via ORAL
  Filled 2023-05-02 (×6): qty 1

## 2023-05-02 MED ORDER — BISOPROLOL FUMARATE 5 MG PO TABS
10.0000 mg | ORAL_TABLET | Freq: Every day | ORAL | Status: DC
Start: 2023-05-02 — End: 2023-05-05
  Administered 2023-05-02 – 2023-05-03 (×2): 10 mg via ORAL
  Filled 2023-05-02 (×4): qty 2

## 2023-05-02 MED ORDER — FUROSEMIDE 10 MG/ML IJ SOLN
20.0000 mg | Freq: Every day | INTRAMUSCULAR | Status: DC
Start: 2023-05-03 — End: 2023-05-05
  Administered 2023-05-03 – 2023-05-04 (×2): 20 mg via INTRAVENOUS
  Filled 2023-05-02 (×2): qty 2

## 2023-05-02 MED ORDER — IOHEXOL 350 MG/ML IV SOLN
80.0000 mL | Freq: Once | INTRAVENOUS | Status: AC | PRN
Start: 2023-05-02 — End: 2023-05-02
  Administered 2023-05-02: 80 mL via INTRAVENOUS

## 2023-05-02 MED ORDER — VALSARTAN 160 MG PO TABS
320.0000 mg | ORAL_TABLET | Freq: Every day | ORAL | Status: DC
Start: 2023-05-02 — End: 2023-05-05
  Administered 2023-05-02 – 2023-05-05 (×4): 320 mg via ORAL
  Filled 2023-05-02 (×4): qty 2

## 2023-05-02 MED ORDER — ATORVASTATIN CALCIUM 10 MG PO TABS
10.0000 mg | ORAL_TABLET | Freq: Every day | ORAL | Status: DC
Start: 2023-05-02 — End: 2023-05-08
  Administered 2023-05-02 – 2023-05-08 (×6): 10 mg via ORAL
  Filled 2023-05-02 (×6): qty 1

## 2023-05-02 MED ORDER — HYDRALAZINE HCL 25 MG PO TABS
25.0000 mg | ORAL_TABLET | Freq: Three times a day (TID) | ORAL | Status: DC | PRN
Start: 2023-05-02 — End: 2023-05-04
  Administered 2023-05-02 – 2023-05-03 (×3): 25 mg via ORAL
  Filled 2023-05-02 (×3): qty 1

## 2023-05-02 MED ORDER — TIMOLOL MALEATE 0.5 % OP SOLN
1.0000 [drp] | Freq: Every day | OPHTHALMIC | Status: DC
Start: 2023-05-02 — End: 2023-05-08
  Administered 2023-05-03 – 2023-05-08 (×6): 1 [drp] via OPHTHALMIC
  Filled 2023-05-02: qty 5

## 2023-05-02 MED ORDER — DOXAZOSIN MESYLATE 4 MG PO TABS
2.0000 mg | ORAL_TABLET | Freq: Every evening | ORAL | Status: DC
Start: 2023-05-02 — End: 2023-05-07
  Administered 2023-05-02 – 2023-05-06 (×5): 2 mg via ORAL
  Filled 2023-05-02 (×5): qty 1

## 2023-05-02 NOTE — Plan of Care (Signed)
Problem: Pain interferes with ability to perform ADL  Goal: Pain at adequate level as identified by patient  Flowsheets (Taken 05/02/2023 1844)  Pain at adequate level as identified by patient: Assess for risk of opioid induced respiratory depression, including snoring/sleep apnea. Alert healthcare team of risk factors identified.     Problem: Hemodynamic Status: Cardiac  Goal: Stable vital signs and fluid balance  Flowsheets (Taken 05/02/2023 1844)  Stable vital signs and fluid balance:   Assess signs and symptoms associated with cardiac rhythm changes   Monitor lab values     Problem: Side Effects from Pain Analgesia  Goal: Patient will experience minimal side effects of analgesic therapy  Outcome: Progressing  Flowsheets (Taken 05/02/2023 1844)  Patient will experience minimal side effects of analgesic therapy:   Monitor/assess patient's respiratory status (RR depth, effort, breath sounds)   Assess for changes in cognitive function     Problem: Safety  Goal: Patient will be free from injury during hospitalization  05/02/2023 1844 by Jeanella Flattery, RN  Outcome: Progressing  Flowsheets (Taken 05/02/2023 1844)  Patient will be free from injury during hospitalization:   Assess patient's risk for falls and implement fall prevention plan of care per policy   Use appropriate transfer methods  05/02/2023 1844 by Jeanella Flattery, RN  Outcome: Progressing  Goal: Patient will be free from infection during hospitalization  Outcome: Progressing  Flowsheets (Taken 05/02/2023 1844)  Free from Infection during hospitalization: Assess and monitor for signs and symptoms of infection     Problem: Inadequate Gas Exchange  Goal: Adequate oxygenation and improved ventilation  Outcome: Progressing  Flowsheets (Taken 05/02/2023 1844)  Adequate oxygenation and improved ventilation:   Assess lung sounds   Monitor SpO2 and treat as needed

## 2023-05-02 NOTE — ED Provider Notes (Signed)
EMERGENCY DEPARTMENT HISTORY AND PHYSICAL EXAM    Date: 05/02/23  Patient Name: Becky Burton  Attending Physician: Blanche East, MD  Patient DOB:  Sep 03, 1946  MRN:  [id number]  Room:  16/B16        History of Presenting Illness     Chief Complaint:    Chief Complaint   Patient presents with    Chest Pain    Shortness of Breath       Historian: Patient    77 y.o. female   Location: Lungs  Onset: At rest  Severity: Moderate  Duration: 3 days  Associated signs/symptoms:     76 year old female presents with 3-day history of chest discomfort as well as shortness of breath and orthopnea.  Patient is visiting from [place] for the summer and staying with her son she was able to follow-up with Dr. Birdena Jubilee for the first time today who sent the patient to the ED for further eval.  She denies fevers she does have a chronic cough she has no back pain or abdominal pain.  No new leg pain or swelling        PMD: Munis, Raiqa A, MD    Past Medical History     Past Medical History:   Diagnosis Date    Disorder of thyroid     Gastroesophageal reflux disease     Hyperlipidemia     Hypertension        Past Surgical History     History reviewed. No pertinent surgical history.    Family History     History reviewed. No pertinent family history.    Social History     Social History     Occupational History    Not on file   Tobacco Use    Smoking status: Never    Smokeless tobacco: Never   Vaping Use    Vaping status: Never Used   Substance and Sexual Activity    Alcohol use: Never    Drug use: Never    Sexual activity: Not on file       Allergies     Allergies   Allergen Reactions    Avelox [Moxifloxacin]     Bisoprolol     Ditropan [Oxybutynin]     Elavil [Amitriptyline]     Hydrochlorothiazide     Methylprednisolone     Sucralfate     Sulfa Antibiotics        Review of Systems       Constitutional:  No fever  Eyes: No discharge   ENT: No ST  CV:  No CP   Resp:  pos SOB and cough  GI: No abd pain, N, V, D  GU: No dysuria  MS:  none  Skin: No rash  Neuro:  No HA  Psych:  No behavior changes  All other systems reviewed and negative    Physical Exam     CONSTITUTIONAL Patient is afebrile, Vital signs reviewed.  HEAD Atraumatic, Normocephalic.  EYES No discharge from eyes, Sclera are normal.  NECK   Normal ROM, Cervical spine nontender  RESPIRATORY CHEST Chest is nontender, Breath sounds diminished bilaterally no respiratory distress.  CARDIOVASCULAR RRR, Heart sounds normal.  ABDOMEN Abdomen is nontender, No peritoneal signs, No distension  BACK   There is no CVA tenderness, There is no tenderness to palpation  UPPER EXTREMITY No cyanosis, No edema  LOWER EXTREMITY No cyanosis, No edema  NEURO GCS is 15, No focal motor deficits, No focal sensory deficits.  SKIN Skin is warm, Skin is dry.  PSYCHIATRIC Normal affect, Normal insight       Monitors, EKG     Cardiac Monitor (interpreted by ED physician):      EKG (interpreted by ED physician): Sinus 63 no ST elevations  I independently interpreted the EKG.       Orders Placed During This Encounter     Orders Placed This Encounter   Procedures    COVID-19 and Influenza (Liat) (symptomatic)    Chest AP Portable    CT Angio Chest (PE study)    CBC with Differential    Comprehensive Metabolic Panel    High Sensitivity Troponin-I    NT-ProBNP    CBC with Differential    Morphology Review    Telemetry Monitoring - Long Term    ECG 12 lead    Echo Adult TTE Complete    Adult Admit to Inpatient         ED Medications Administered     ED Medication Orders (From admission, onward)      Start Ordered     Status Ordering Provider    05/02/23 1651 05/02/23 1651  iohexol (OMNIPAQUE) 350 MG/ML injection 80 mL  IMG once as needed        Route: Intravenous  Ordered Dose: 80 mL       Last MAR action: Imaging Agent Given Celeste Tavenner, Kellogg L            Data Review   Nursing Records Reviewed and Agree: Yes      Laboratory results reviewed by ED provider: if applicable yes  Radiologic study results reviewed by ED provider:   If applicable yes    Rendering Provider: Blanche East, MD      Diagnostic Study Results     Labs     Results       Procedure Component Value Units Date/Time    COVID-19 and Influenza ([place]iat) (symptomatic) [[id number]]  (Normal) Collected: 05/02/23 1542    Specimen: Swab from Anterior Nares Updated: 05/02/23 1645     SARS-CoV-2 (COVID-19) RNA Not Detected     Influenza A RNA Not Detected     Influenza B RNA Not Detected    Narrative:      A result of "Detected" indicates POSITIVE for the presence of viral RNA  A result of "Not Detected" indicates NEGATIVE for the presence of viral RNA    Test performed using the Roche cobas Liat SARS-CoV-2 & Influenza A/B assay. This is a multiplex real-time RT-PCR assay for the detection of SARS-CoV-2, influenza A, and influenza B virus RNA. Viral nucleic acids may persist in vivo, independent of viability. Detection of viral nucleic acid does not imply the presence of infectious virus, or that virus nucleic acid is the cause of clinical symptoms. Negative results do not preclude SARS-CoV-2, influenza A, and/or influenza B infection and should not be used as the sole basis for diagnosis, treatment or other patient management decisions. Invalid results may be due to inhibiting substances in the specimen and recollection should occur.     CBC with Differential [[id number]]  (Abnormal) Collected: 05/02/23 1540    Specimen: Blood, Venous Updated: 05/02/23 1643    Narrative:      The following orders were created for panel order CBC with Differential.  Procedure                               Abnormality  Status                     ---------                               -----------         ------                     CBC with Differential[[id number]]        Abnormal            Final result               Morphology Review[[id number]]            Abnormal            Final result                 Please view results for these tests on the individual orders.    CBC with Differential [[id number]]   (Abnormal) Collected: 05/02/23 1540    Specimen: Blood, Venous Updated: 05/02/23 1643     WBC 6.16 x10 3/uL      Hemoglobin 10.8 g/dL      Hematocrit 40.9 %      Platelet Count 115 x10 3/uL      MPV 11.1 fL      RBC 3.74 x10 6/uL      MCV 86.9 fL      MCH 28.9 pg      MCHC 33.2 g/dL      RDW 14 %      nRBC % 0.3 /100 WBC      Absolute nRBC 0.02 x10 3/uL      Preliminary Absolute Neutrophil Count 3.77 x10 3/uL      Neutrophils % 61.3 %      Lymphocytes % 25.3 %      Monocytes % 9.9 %      Eosinophils % 2.4 %      Basophils % 0.8 %      Immature Granulocytes % 0.3 %      Absolute Neutrophils 3.77 x10 3/uL      Absolute Lymphocytes 1.56 x10 3/uL      Absolute Monocytes 0.61 x10 3/uL      Absolute Eosinophils 0.15 x10 3/uL      Absolute Basophils 0.05 x10 3/uL      Absolute Immature Granulocytes 0.02 x10 3/uL     Morphology Review [[id number]]  (Abnormal) Collected: 05/02/23 1540    Specimen: Blood, Venous Updated: 05/02/23 1643     RBC Morphology Normal     Platelet Estimate Normal     Platelet Clumps Present    NT-ProBNP [[id number]]  (Abnormal) Collected: 05/02/23 1540    Specimen: Blood, Venous Updated: 05/02/23 1614     NT-ProBNP 2,322 pg/mL     High Sensitivity Troponin-I [[id number]]  (Normal) Collected: 05/02/23 1540    Specimen: Blood, Venous Updated: 05/02/23 1613     hs Troponin 4.1 ng/L     Comprehensive Metabolic Panel [[id number]]  (Abnormal) Collected: 05/02/23 1540    Specimen: Blood, Venous Updated: 05/02/23 1612     Glucose 108 mg/dL      BUN 8 mg/dL      Creatinine 0.7 mg/dL      Sodium 010 mEq/L      Potassium 3.9 mEq/L      Chloride 99 mEq/L      CO2 27  mEq/L      Calcium 9.0 mg/dL      Anion Gap 8.0     GFR >60.0 mL/min/1.73 m2      AST (SGOT) 14 U/L      ALT 9 U/L      Alkaline Phosphatase 53 U/L      Albumin 3.4 g/dL      Protein, Total 6.6 g/dL      Globulin 3.2 g/dL      Albumin/Globulin Ratio 1.1     Bilirubin, Total 0.5 mg/dL             Radiologic Studies  Radiology Results (24 Hour)        Procedure Component Value Units Date/Time    CT Angio Chest (PE study) [[id number]] Resulted: 05/02/23 1644    Order Status: Sent Updated: 05/02/23 1654    Chest AP Portable [[id number]] Collected: 05/02/23 1603    Order Status: Completed Updated: 05/02/23 1607    Narrative:      HISTORY: Shortness of breath     COMPARISON: 03/28/2023    FINDINGS:   Small left pleural effusion. No new consolidation. No pneumothorax.  Unchanged mediastinal silhouette. No acute osseous abnormality.  Redemonstrated round calcification projecting over the left upper quadrant.        Impression:         Small left pleural effusion.       August Albino  05/02/2023 4:05 PM        .        Procedures         Clinical Notes, Consults & Reevaluations, and Medical Decision Making     For a more detailed timeline, please see ED Patient Care timeline in Epic Chart    Medical Decision Making Evaluation Section/ED course:   History, Vital signs and Physical Exam, labs imaging were done to evaluate the patient as part of the Medical Decision Making. I reviewed EMS and/or nursing home reports as part of patient's care.    Differential Diagnoses Considered as part of Medical Decision Making:   3:23 PM Early differential includes, but is not limited to: PE pneumonia CHF    Re-eval of Patient and Interventions/Treatments Performed and Consults Performed with Specialists:  3:30 PM I have ordered labs imaging eval patient symptoms    I have ordered and intrepreted the following labs, imaging and data as part of my HIGH complexity medical decision making and reasoning:   Recent Labs     05/02/23  1540   WBC 6.16   Hemoglobin 10.8*   Glucose 108*   hs Troponin 4.1   AST (SGOT) 14   ALT 9   Bilirubin, Total 0.5        Lab evaluation medical decision making and reasoning:  I ordered the complete blood count (CBC) so I could check the white blood cell count (WBC) to evaluate for signs of infection.  I also tested the hemoglobin to evaluate for blood loss.  I ordered  the metabolic panel (BMP/CMP) so I could evaluate the various electrolytes as well as the blood sugar  I ordered the troponin level (Trop) to evaluate for any heart injury  I ordered the  flu COVID test to evaluate for infection.       Imaging Studies Performed and intrepreted as part of my HIGH complexity medical decision making and reasoning:  Imaging evaluation medical decision making and reasoning:  I ordered the chest x-ray to evaluate for pneumonia or pneumothorax as well  as other illness.    I independently intrepreted the xrays/cat scans and agree with the radiologists readings.   Chest AP Portable   Final Result       Small left pleural effusion.          August Albino   05/02/2023 4:05 PM      CT Angio Chest (PE study)    (Results Pending)   Echo Adult TTE Complete    (Results Pending)       Continued ED Reevaluation, Consults Performed with Specialists and Continued Medical Decision Making Process:   5:01 PM I discussed the case with cardiology who recommends admission.  Dr. Fawn Kirk will admit the patient to her service.      Diagnosis and Disposition   Diagnosis/Clinical Impression:  1. Acute on chronic congestive heart failure, unspecified heart failure type    2. Dyspnea    3. Pleural effusion    4. Elevated brain natriuretic peptide (BNP) level        Disposition  ED Disposition       ED Disposition   Admit    Condition   --    Date/Time   Fri May 02, 2023  5:00 PM    Comment   Admitting Physician: Lurline Idol [[id number]]   Service:: Medicine [[id number]]   Estimated Length of Stay: > or = to 2 midnights   Tentative Discharge Plan?: Home or Self Care [1]   Does patient need telemetry?: Yes   Is patient 18 yrs or greater?: Yes   Telemetry type (separate Telemetry order is also required):: Adult telemetry                 Prescriptions    New Prescriptions    No medications on file             Critical Care     Critical care exclusive of time spent performing procedures.    Total time:          Signout If Applicable      Patient signed out to:      Signout notes:          This note was generated by the Epic EMR system/ Dragon speech recognition and may contain inherent errors or omissions not intended by the user. Grammatical errors, random word insertions, deletions, pronoun errors and incomplete sentences are occasional consequences of this technology due to software limitations. Not all errors are caught or corrected. If there are questions or concerns about the content of this note or information contained within the body of this dictation they should be addressed directly with the author for clarification.       Harden Mo, MD  05/02/23 [phone]

## 2023-05-02 NOTE — Progress Notes (Signed)
Admission Note     Patient admitted to PCU #417-1  Patient connected to tele monitor, confirmed with monitor tech.   Patient oriented to room, bed in lowest position, bed alarm activated, call bell within reach.   Patient informed of visitor policy.   Belongings: clothing, cellphone.      Brief Clinical Picture:   Neuro AXOx4   Resp - Ra  Cardiac - NSR- pt has Bl ankle edema   GI- abdomen soft, non-tender     Skin Assessment   Two person skin assessment performed with: Nurse Mikeala   Areas observed with redness or injury: None   Braden score <15. No   Actions taken: N/A      4 eyes in 4 hours pressure injury assessment note:       Completed with: Rn  Unit & Time admitted: PCU/ 5                                                                                                     Bony Prominences: Check appropriate box; if wound is present enter wound assessment in LDA             Occiput:                 [x] WNL  []  Wound present  Face:                     [x] WNL  []  Wound present  Ears:                      [x] WNL        []  Wound present  Spine:                    [x] WNL        []  Wound present  Shoulders:             [x] WNL        []  Wound present  Elbows:                  [x] WNL        []  Wound present  Sacrum/coccyx:     [x] WNL        []  Wound present  Ischial Tuberosity:  [x] WNL       []  Wound present  Trochanter/Hip:      [x] WNL      []  Wound present  Knees:                   [x] WNL        []  Wound present  Ankles:                   [x] WNL       []  Wound present  Heels:                    [x] WNL        []  Wound present  Other pressure areas:     []  Wound location  Device related: []  Device name:           LDA completed if wound present: yes/no  Consult WOCN if necessary     Other skin related issues, ie tears, rash, etc, document in Integumentary flowsheet

## 2023-05-02 NOTE — Pharmacy Admission Med History Advanced (Signed)
Medication Reconciliation for Admission    Patient's Pharmacy Information:    Primary Pharmacy Updated in Epic: Yes  Preferred pharmacy:   [id number] [place], [place] - [address] AT [facility] [address]  [address]  [place] [address]  Phone: [phone] Fax: [phone]        Primary Source of Medication History: Reviewed with patient.    Patient demonstrates Good compliance with medications.  Patient demonstrates Good knowledge of medications.     Prior to Admission Medication List:  Home Medications       Med List Status: Pharmacy Completed Set By: Fabian November, RPH at 05/02/2023  5:49 PM              CRANBERRY EXTRACT PO     Take 1 capsule by mouth daily     doxazosin (CARDURA) 2 MG tablet     Take 1 tablet (2 mg) by mouth nightly     famotidine (PEPCID) 40 MG tablet     Take 1 tablet (40 mg) by mouth daily     Flaxseed, Linseed, (FLAXSEED OIL PO)     Take 1 capsule by mouth daily     hydrALAZINE (APRESOLINE) 25 MG tablet     Take 1 tablet (25 mg) by mouth 3 (three) times daily     hydrocortisone 2.5 % cream     Apply topically 2 (two) times daily     levothyroxine (SYNTHROID) 112 MCG tablet     Take by mouth Once a day at 6:00am     lovastatin (MEVACOR) 40 MG tablet     Take 1 tablet (40 mg) by mouth nightly     nebivolol (BYSTOLIC) 5 MG tablet     Take 1 tablet (5 mg) by mouth daily     Omega-3 Fatty Acids (FISH OIL CONCENTRATE PO)     Take 1 capsule by mouth daily     omeprazole (PriLOSEC) 40 MG capsule     Take 1 capsule (40 mg) by mouth daily     timolol (TIMOPTIC) 0.5 % ophthalmic solution     Place 1 drop into the left eye daily     valsartan (DIOVAN) 320 MG tablet     Take 1 tablet (320 mg) by mouth daily     VITAMIN D, CHOLECALCIFEROL, PO     Take 1 capsule by mouth daily                Meleni Delahunt, RPH

## 2023-05-02 NOTE — H&P (Signed)
ADMISSION HISTORY AND PHYSICAL EXAM    Date Time: 05/02/23 5:22 PM  Patient Name: Becky Burton  Attending Physician: Lurline Idol, MD      CC: sob breath and chest pressure    Assessment/ Plan:     Acute congestive heart failure.  Admit patient on telemetry.  Will get Lasix 20 mg IV daily.  As patient BNP level is 2300 and chest x-ray shows left pleural effusion.  Will get cardiology consult.  Get echocardiogram.  Fluid restriction to 1500 cc/day.  Low-salt diet.  Hypertension stable continue her NovoLog 10 mg once a day, Cardura 2 mg once a day hydralazine 25 every 6 as needed if systolic greater than 180.  And valsartan 320 mg once a day    Hypothyroidism continue Synthroid 112 mcg once a day    Hypercholesterolemia continue Lipitor 10 mg once a day  Gastroesophageal reflux disease continue her Protonix and famotidine  Mild hyponatremia most likely due to fluid overload will repeat labs in the morning.  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   Care coordinated with the nursing staff . Plan is to follow patient clinically. Additional plan will depend upon the clinical course and and consultant recommendation  This note was generated by the Epic EMR system/ Dragon speech recognition and may contain inherent errors or omissions not intended by the user. Grammatical errors, random word insertions, deletions, pronoun errors and incomplete sentences are occasional consequences of this technology due to software limitations. Not all errors are caught or corrected. If  there are questions or concerns about the content of this note or information contained within the body of this dictation they should be addressed directly with the author for clarification    History of Present Illness:   History taken from pt and ER  Physician Hattie Perch    Becky Burton is a 77 y.o. female with H/O  HTN, hypothy hypothyroidism, hypercholesterolemia, came to the office complaining of chest pressure shortness of breath.  Patient had chest pressure last week also was advised to do the EKG or go to the ER at that time patient refused at.  Today patient came to the office complaining that she could shortness of breath worse with exertion.  She cannot lay down flat.  When she lays down she feels there is a pressure on her chest.  She has some cough  She might have a pneumonia.  Feet.  No fever or chills.  Patient had the symptoms for last 1 week.  EKG was done in the office which was unremarkable and clinical examination as patient was in CHF patient was advised to go to the emergency room.  In the ER patient was found to be in CHF therefore she is admitted to hospital  for further evaluation and management due to        Review of Systems:     A comprehensive review of systems was obtained from chart review and the patient   General : Denies - fever or chills fatigue , lightheadedness.or dizziness blurry vision  ENT denies -nasal congestion or tinnitus, no sore throat, no ear ache   Gastrointestinal  no abdominal pain, change in bowel habits, or black or bloody stools no nausea vomiting  Neurological  denies  bowel and bladder control changes, headaches, impaired coordination/balance or numbness/tingling   GU ,no frequency , urgency ,or burning micturation.  Psychiatry.  No anxiety, depression,  Skin no rash  Endocrinology denies polyuria, polydipsia  Lymphatic denies swollen glands      Past Medical History:     Past Medical History:   Diagnosis Date    Disorder of thyroid     Gastroesophageal  reflux disease     Hyperlipidemia     Hypertension        Past Surgical History:   History reviewed. No pertinent surgical history.        Medications:   (Not in a hospital admission)        Allergies:     Allergies   Allergen Reactions    Avelox [Moxifloxacin]     Bisoprolol     Ditropan [Oxybutynin]     Elavil [Amitriptyline]     Hydrochlorothiazide     Methylprednisolone     Sucralfate     Sulfa Antibiotics          Social History:     Social History     Socioeconomic History    Marital status: Single     Spouse name: Not on file    Number of children: Not on file    Years of education: Not on file    Highest education level: Not on file   Occupational History    Not on file   Tobacco Use    Smoking status: Never    Smokeless tobacco: Never   Vaping Use    Vaping status: Never Used   Substance and Sexual Activity    Alcohol use: Never    Drug use: Never    Sexual activity: Not on file   Other Topics Concern    Not on file   Social History Narrative    Not on file     Social Determinants of Health     Financial Resource Strain: Not on file   Food Insecurity: No Food Insecurity (03/28/2023)    Hunger Vital Sign     Worried About Running Out of Food in the Last Year: Never true     Ran Out of Food in the Last Year: Never true   Transportation Needs: Not on file   Physical Activity: Not on file   Stress: Not on file   Social Connections: Not on file   Intimate Partner Violence: Not At Risk (03/28/2023)    Humiliation, Afraid, Rape, and Kick questionnaire     Fear of Current or Ex-Partner: No     Emotionally Abused: No     Physically Abused: No     Sexually Abused: No   Housing Stability: Not on file         Family History:   History reviewed. No pertinent family history.                Physical Exam:     Vitals:  05/02/23 1700   BP: 161/79   Pulse: 60   Resp: 20   Temp: 98.6 F (37 C)   SpO2: 96%       Intake and Output Summary (Last 24 hours) at Date Time  No intake or output data in the 24 hours ending 05/02/23  1722      GENERAL: Patient is in no acute distress   HEENT:Head normocephalic, atraumatic,pupil equal,reacting to light  No scleral icterus or conjunctival pallor, moist mucous membranes   NECK: No jugular venous distention or thyromegaly, normal carotid upstrokes without bruits   CARDIAC: Normal apical impulse, reg rate and rhythm, with normal S1 and S2, and no murmurs, rubs, or gallops   CHEST: Bilaterally symmetrical, equal in expansion. Bilateral basal crackles  ABDOMEN: Soft ,nontender, No distention,, masses, or hepatosplenomegaly , good bowel sounds   EXTREMITIES: No clubbing, cyanosis, : No Edema; Peripheral pulses intact; Cap refill normal  SKIN: No rash or jaundice   NEUROLOGIC: Alert and oriented to time, place and person, normal mood and affect. No focal motor sensory deficit present.   MUSCULOSKELETAL: Normal muscle strength and tone.         Labs:     Results       Procedure Component Value Units Date/Time    COVID-19 and Influenza ([place]) (symptomatic) [[id number]]  (Normal) Collected: 05/02/23 1542    Specimen: Swab from Anterior Nares Updated: 05/02/23 1645     SARS-CoV-2 (COVID-19) RNA Not Detected     Influenza A RNA Not Detected     Influenza B RNA Not Detected    Narrative:      A result of "Detected" indicates POSITIVE for the presence of viral RNA  A result of "Not Detected" indicates NEGATIVE for the presence of viral RNA    Test performed using the Roche cobas [place] SARS-CoV-2 & Influenza A/B assay. This is a multiplex real-time RT-PCR assay for the detection of SARS-CoV-2, influenza A, and influenza B virus RNA. Viral nucleic acids may persist in vivo, independent of viability. Detection of viral nucleic acid does not imply the presence of infectious virus, or that virus nucleic acid is the cause of clinical symptoms. Negative results do not preclude SARS-CoV-2, influenza A, and/or influenza B infection and should not be used as the sole basis for diagnosis, treatment or other patient  management decisions. Invalid results may be due to inhibiting substances in the specimen and recollection should occur.     CBC with Differential [[id number]]  (Abnormal) Collected: 05/02/23 1540    Specimen: Blood, Venous Updated: 05/02/23 1643    Narrative:      The following orders were created for panel order CBC with Differential.  Procedure                               Abnormality         Status                     ---------                               -----------         ------                     CBC with Differential[[id number]]        Abnormal  Final result               Morphology Review[[id number]]            Abnormal            Final result                 Please view results for these tests on the individual orders.    CBC with Differential [[id number]]  (Abnormal) Collected: 05/02/23 1540    Specimen: Blood, Venous Updated: 05/02/23 1643     WBC 6.16 x10 3/uL      Hemoglobin 10.8 g/dL      Hematocrit 65.7 %      Platelet Count 115 x10 3/uL      MPV 11.1 fL      RBC 3.74 x10 6/uL      MCV 86.9 fL      MCH 28.9 pg      MCHC 33.2 g/dL      RDW 14 %      nRBC % 0.3 /100 WBC      Absolute nRBC 0.02 x10 3/uL      Preliminary Absolute Neutrophil Count 3.77 x10 3/uL      Neutrophils % 61.3 %      Lymphocytes % 25.3 %      Monocytes % 9.9 %      Eosinophils % 2.4 %      Basophils % 0.8 %      Immature Granulocytes % 0.3 %      Absolute Neutrophils 3.77 x10 3/uL      Absolute Lymphocytes 1.56 x10 3/uL      Absolute Monocytes 0.61 x10 3/uL      Absolute Eosinophils 0.15 x10 3/uL      Absolute Basophils 0.05 x10 3/uL      Absolute Immature Granulocytes 0.02 x10 3/uL     Morphology Review [[id number]]  (Abnormal) Collected: 05/02/23 1540    Specimen: Blood, Venous Updated: 05/02/23 1643     RBC Morphology Normal     Platelet Estimate Normal     Platelet Clumps Present    NT-ProBNP [[id number]]  (Abnormal) Collected: 05/02/23 1540    Specimen: Blood, Venous Updated: 05/02/23 1614     NT-ProBNP 2,322 pg/mL      High Sensitivity Troponin-I [[id number]]  (Normal) Collected: 05/02/23 1540    Specimen: Blood, Venous Updated: 05/02/23 1613     hs Troponin 4.1 ng/L     Comprehensive Metabolic Panel [[id number]]  (Abnormal) Collected: 05/02/23 1540    Specimen: Blood, Venous Updated: 05/02/23 1612     Glucose 108 mg/dL      BUN 8 mg/dL      Creatinine 0.7 mg/dL      Sodium 563 mEq/L      Potassium 3.9 mEq/L      Chloride 99 mEq/L      CO2 27 mEq/L      Calcium 9.0 mg/dL      Anion Gap 8.0     GFR >60.0 mL/min/1.73 m2      AST (SGOT) 14 U/L      ALT 9 U/L      Alkaline Phosphatase 53 U/L      Albumin 3.4 g/dL      Protein, Total 6.6 g/dL      Globulin 3.2 g/dL      Albumin/Globulin Ratio 1.1     Bilirubin, Total 0.5 mg/dL  Rads:   Radiological Procedure reviewed.  Radiology Results (24 Hour)       Procedure Component Value Units Date/Time    CT Angio Chest (PE study) [[id number]] Collected: 05/02/23 1704    Order Status: Completed Updated: 05/02/23 1709    Narrative:      HISTORY: Chest pain    COMPARISON: None.    TECHNIQUE:  CT angiogram of the chest performed with intravenous contrast.  Multiplanar reformatted and 3D maximum intensity projection (MIP) images  were created and reviewed. The following dose reduction techniques were  utilized: automated exposure control and/or adjustment of the mA and/or KV  according to patient size, and the use of an iterative reconstruction  technique.    CONTRAST: iohexol (OMNIPAQUE) 350 MG/ML injection 80 mL    FINDINGS:  No detectable pulmonary embolism. 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 Coronary artery calcification.  Aortic valvular calcification. Biatrial dilatation.    Scattered mediastinal and hilar lymph nodes are subcentimeter in short axis  and likely reactive. Small bilateral pleural effusions and atelectasis.  Scarring within the lung bases. The lungs are otherwise clear. No  suspicious pulmonary nodule.    Peripherally  calcified lesion within the spleen is statistically benign.  Degenerative change thoracic spine.      Impression:        1. No detectable pulmonary embolism.  2. Small bilateral pleural effusions and atelectasis.    Launa Flight, MD  05/02/2023 5:07 PM    Chest AP Portable [[id number]] Collected: 05/02/23 1603    Order Status: Completed Updated: 05/02/23 1607    Narrative:      HISTORY: Shortness of breath     COMPARISON: 03/28/2023    FINDINGS:   Small left pleural effusion. No new consolidation. No pneumothorax.  Unchanged mediastinal silhouette. No acute osseous abnormality.  Redemonstrated round calcification projecting over the left upper quadrant.        Impression:         Small left pleural effusion.       August Albino  05/02/2023 4:05 PM            Signed by: Lurline Idol, MD

## 2023-05-02 NOTE — Plan of Care (Signed)
Problem: Inadequate Gas Exchange  Goal: Adequate oxygenation and improved ventilation  Outcome: Progressing  Flowsheets (Taken 05/02/2023 2301)  Adequate oxygenation and improved ventilation:   Monitor SpO2 and treat as needed   Position for maximum ventilatory efficiency   Increase activity as tolerated/progressive mobility

## 2023-05-02 NOTE — ED to IP RN Note (Signed)
[facility] EMERGENCY DEPARTMENT  ED NURSING NOTE FOR THE RECEIVING INPATIENT NURSE   ED NURSE Enid Cutter [phone]   ED CHARGE RN Sam [phone]   ADMISSION INFORMATION   Becky Burton is a 77 y.o. female admitted with an ED diagnosis of:    1. Acute on chronic congestive heart failure, unspecified heart failure type    2. Dyspnea    3. Pleural effusion    4. Elevated brain natriuretic peptide (BNP) level         Isolation: None   Allergies: Avelox [moxifloxacin], Bisoprolol, Ditropan [oxybutynin], Elavil [amitriptyline], Hydrochlorothiazide, Methylprednisolone, Sucralfate, and Sulfa antibiotics   Holding Orders confirmed? Yes   Belongings Documented? yes   Home medications sent to pharmacy confirmed? N/A   NURSING CARE   Patient Comes From:   Mental Status: Home Independent  alert   ADL: Independent with all ADLs   Ambulation: 1 person assist   Pertinent Information  and Safety Concerns:     Broset Violence Risk Level: Low From home independent at baseline     CT / NIH   CT Head ordered on this patient?  No   NIH/Dysphagia assessment done prior to admission? N/A   VITAL SIGNS (at the time of this note)      Vitals:    05/02/23 1700   BP: 161/79   Pulse: 60   Resp: 20   Temp: 98.6 F (37 C)   SpO2: 96%     Pain Score: 0-No pain (05/02/23 1700)

## 2023-05-02 NOTE — ED Triage Notes (Signed)
Patient presents to the ED with increasing chest heaviness, SOB and weight gian for the past 3 days. Patient sent by PCP. Denies fevers or chills.

## 2023-05-02 NOTE — Plan of Care (Signed)
Brief [facility] Cardiology plan of care:    77 year old female from [place] with a history of hypertension, hyperlipidemia, GERD, hypothyroidism presenting to the ED for 3 day history of chest discomfort, dyspnea on exertion, and. No known history of CAD or CHF.     NT-proBNP 2300, CXR with small L pleural effusion   Initial hs-Trop 4.1  Na 134 (may be dilutional from CHF)  EKG pending   CTA chest pending     Recommendations:  Admit for gentle diuresis and further work-up  Recommend IV Lasix 20 mg. Close I&O monitoring   BP control with goal <140 mmHg  Echocardiogram ordered   Telemetry monitoring   Trend sodium   Repeat troponin for trending  Consider inpatient v outpatient ischemic work-up  Formal consult to follow tomorrow

## 2023-05-02 NOTE — ED Triage Notes (Signed)
Patient presents to the ED with increasing chest heaviness, SOB and weight gian for the past 3 days. Patient sent by PCP. Denies fevers or chills.

## 2023-05-03 ENCOUNTER — Inpatient Hospital Stay: Payer: Medicare Other

## 2023-05-03 DIAGNOSIS — I509 Heart failure, unspecified: Secondary | ICD-10-CM

## 2023-05-03 LAB — ECHO ADULT TTE COMPLETE
AV Area (Cont Eq VTI): 2.426
AV Area (Cont Eq VTI): 2.43
AV Mean Gradient: 4
AV Peak Velocity: 1.45
Ao Root Diameter (2D): 3.1
Ao Root Diameter (2D): 3.2
IVS Diastolic Thickness (2D): 0.943
LA Dimension (2D): 4.4
LA Volume Index (BP A-L): 45
LVID diastole (2D): 5.24
LVID systole (2D): 3.36
LVID systole (2D): 3.39
MV Area (PHT): 4.801
MV E/A: 3.4
MV E/A: 3.444
MV E/e' (Average): 12.958
Mitral Valve Findings: NORMAL
Prox Ascending Aorta Diameter: 3
Pulmonary Valve Findings: NORMAL
RV Basal Diastolic Dimension: 4.07
RV Function: NORMAL
RV Systolic Pressure: 50.51
RV Systolic Pressure: 51
Site RA Size (AS): NORMAL
Site RV Size (AS): NORMAL
TAPSE: 2.67
Tricuspid Valve Findings: NORMAL

## 2023-05-03 LAB — CBC
Absolute nRBC: 0 10*3/uL (ref <=0.00)
Hematocrit: 29.9 % — ABNORMAL LOW (ref 34.7–43.7)
Hemoglobin: 9.8 g/dL — ABNORMAL LOW (ref 11.4–14.8)
MCH: 28.9 pg (ref 25.1–33.5)
MCHC: 32.8 g/dL (ref 31.5–35.8)
MCV: 88.2 fL (ref 78.0–96.0)
MPV: 11.4 fL (ref 8.9–12.5)
Platelet Count: 245 10*3/uL (ref 142–346)
RBC: 3.39 10*6/uL — ABNORMAL LOW (ref 3.90–5.10)
RDW: 14 % (ref 11–15)
WBC: 5.22 10*3/uL (ref 3.10–9.50)
nRBC %: 0 /100 WBC (ref <=0.0)

## 2023-05-03 LAB — BASIC METABOLIC PANEL
Anion Gap: 9 (ref 5.0–15.0)
BUN: 7 mg/dL (ref 7–21)
CO2: 25 mEq/L (ref 17–29)
Calcium: 8.5 mg/dL (ref 7.9–10.2)
Chloride: 101 mEq/L (ref 99–111)
Creatinine: 0.7 mg/dL (ref 0.4–1.0)
GFR: >60 mL/min/{1.73_m2} (ref >=60.0)
Glucose: 85 mg/dL (ref 70–100)
Potassium: 4 mEq/L (ref 3.5–5.3)
Sodium: 135 mEq/L (ref 135–145)

## 2023-05-03 NOTE — Plan of Care (Signed)
Problem: Safety  Goal: Patient will be free from injury during hospitalization  Outcome: Progressing  Flowsheets (Taken 05/03/2023 1834)  Patient will be free from injury during hospitalization:   Assess patient's risk for falls and implement fall prevention plan of care per policy   Provide and maintain safe environment   Include patient/ family/ care giver in decisions related to safety   Ensure appropriate safety devices are available at the bedside   Assess for patients risk for elopement and implement Elopement Risk Plan per policy   Provide alternative method of communication if needed (communication boards, writing)   Use appropriate transfer methods   Hourly rounding  Goal: Patient will be free from infection during hospitalization  Outcome: Progressing  Flowsheets (Taken 05/03/2023 1834)  Free from Infection during hospitalization:   Assess and monitor for signs and symptoms of infection   Encourage patient and family to use good hand hygiene technique   Monitor lab/diagnostic results   Monitor all insertion sites (i.e. indwelling lines, tubes, urinary catheters, and drains)     Problem: Inadequate Gas Exchange  Goal: Adequate oxygenation and improved ventilation  Outcome: Progressing  Flowsheets (Taken 05/03/2023 1834)  Adequate oxygenation and improved ventilation:   Plan activities to conserve energy: plan rest periods   Position for maximum ventilatory efficiency   Assess lung sounds   Monitor SpO2 and treat as needed   Monitor and treat ETCO2   Teach/reinforce use of incentive spirometer 10 times per hour while awake, cough and deep breath as needed   Increase activity as tolerated/progressive mobility   Consult/collaborate with Respiratory Therapy   Provide mechanical and oxygen support to facilitate gas exchange

## 2023-05-03 NOTE — Consults (Signed)
[facility] CONSULTATION    Date Time: 05/03/23 7:47 AM  Patient Name: CELISSE, GRIBBEN  Requesting Physician: Lurline Idol, MD  Admission Date: 05/02/2023   Primary Cardiologist:  Marcie Mowers, MD [facility])    Reason for Consultation:   Chest pain; DOE.    History:   Lindey Swallow is a 77 y.o. female with a history of HTN, HLD, GERD, and hypothyroidism who presented to the hospital on 05/02/2023 with chest discomfort and DOE.     Patient reports that she originally went to [facility] for a rash.  She was found to be hypertensive with blood pressures in the 200s systolic.  She was then referred here to [facility] for further evaluation.     She reports that she experienced left-sided chest heaviness, and difficulty breathing recently. She noticed she had occasional wheezing that she did not have previously.  She reports she has had difficulty sleeping due to her breathing, and was unable to lie comfortably. Patient reports she sleeps on 2 pillows at night.       She noticed BLE edema for the last few months, but she reports she was putting it off and did not get evaluated at the time.  She is visiting from [place], for her son's birthday in August.    Currently she denies CP, palpitations, dizziness, lightheadedness, syncope, or claudication.    In the ED, her NT-ProBNP was elevated 2,322. Chest CT revealed small bilateral pleural effusions and atelectasis. CT was negative for P.E.    She reports her systolic blood pressures have been fluctuating between130 and 170 at home. Her weights at home range in the 150s, she is currently 168 lbs.     She reports family history of hypertension with both of her parents.  Patient denies family history of CAD, diabetes, hyperlipidemia, strokes, or heart attacks.    Past Medical and Surgical History:   She has a past medical history of Disorder of thyroid, Gastroesophageal reflux disease, Hyperlipidemia, and Hypertension. She has no past surgical  history on file.    Medications:     Home Meds:    doxazosin (CARDURA) 2 MG tablet Take 1 tablet (2 mg) by mouth nightly    famotidine (PEPCID) 40 MG tablet Take 1 tablet (40 mg) by mouth daily    Flaxseed, Linseed, (FLAXSEED OIL PO) Take 1 capsule by mouth daily    hydrALAZINE (APRESOLINE) 25 MG tablet Take 1 tablet (25 mg) by mouth 3 (three) times daily    levothyroxine (SYNTHROID) 112 MCG tablet Take by mouth Once a day at 6:00am    lovastatin (MEVACOR) 40 MG tablet Take 1 tablet (40 mg) by mouth nightly    nebivolol (BYSTOLIC) 5 MG tablet Take 1 tablet (5 mg) by mouth daily    Omega-3 Fatty Acids (FISH OIL CONCENTRATE PO) Take 1 capsule by mouth daily    omeprazole (PriLOSEC) 40 MG capsule Take 1 capsule (40 mg) by mouth daily    timolol (TIMOPTIC) 0.5 % ophthalmic solution Place 1 drop into the left eye daily    valsartan (DIOVAN) 320 MG tablet Take 1 tablet (320 mg) by mouth daily    VITAMIN D, CHOLECALCIFEROL, PO Take 1 capsule by mouth daily      Scheduled Meds:     atorvastatin, 10 mg, Oral, Daily  bisoprolol, 10 mg, Oral, Daily  doxazosin, 2 mg, Oral, QHS  famotidine, 40 mg, Oral, Daily  furosemide, 20 mg, Intravenous, Daily  heparin (porcine), 5,000 Units, Subcutaneous,  Q12H [facility]  levothyroxine, 112 mcg, Oral, Daily at 0600  pantoprazole, 40 mg, Oral, QAM AC  timolol, 1 drop, Both Eyes, Daily  valsartan, 320 mg, Oral, Daily    PRN Meds:  hydrALAZINE.      Physical Exam:   Vital Signs: BP 172/75   Pulse (!) 52   Temp 98.2 F (36.8 C) (Oral)   Resp 17   Ht 1.499 m (4\' 11" )   Wt 76.5 kg (168 lb 11.2 oz)   SpO2 91%   BMI 34.07 kg/m      Intake/Output Summary (Last 24 hours) at 05/03/2023 0700  Last data filed at 05/03/2023 4540  Gross per 24 hour   Intake --   Output 100 ml   Net -100 ml     General: awake, alert, no acute distress.  Cardiovascular: regular rhythm; no murmurs, rubs, or gallops     Lungs: bibasilar rales; otherwise clear to auscultation bilaterally  Abdomen: soft, non-tender,  non-distended, normoactive bowel sounds  Extremities: 1+symmetric LE edema.    Cardiographics:   ECG: NSR with inferolateral ST wave abnormalities. HR: 62 bpm.  Telemetry: NSR, HR: 62 bpm.   Nuclear stress test: Reports she had one in the past and was reportedly normal.     Labs Reviewed:     Recent Labs     05/03/23  0434 05/02/23  1540   WBC 5.22 6.16   Hemoglobin 9.8* 10.8*   Hematocrit 29.9* 32.5*   Platelet Count 245 115*     Recent Labs     05/02/23  1540   hs Troponin 4.1    Recent Labs     05/03/23  0434 05/02/23  1540   Sodium 135 134*   Potassium 4.0 3.9   CO2 25 27   BUN 7 8   Creatinine 0.7 0.7   Glucose 85 108*    Estimated Creatinine Clearance: 60 mL/min (based on SCr of 0.7 mg/dL).        Rads:      CT Angio Chest (PE study) [[id number]] Collected: 05/02/23 1704    Order Status: Completed Updated: 05/02/23 1709    Narrative:      HISTORY: Chest pain    COMPARISON: None.    TECHNIQUE:  CT angiogram of the chest performed with intravenous contrast.  Multiplanar reformatted and 3D maximum intensity projection (MIP) images  were created and reviewed. The following dose reduction techniques were  utilized: automated exposure control and/or adjustment of the mA and/or KV  according to patient size, and the use of an iterative reconstruction  technique.    CONTRAST: iohexol (OMNIPAQUE) 350 MG/ML injection 80 mL    FINDINGS:  No detectable pulmonary embolism. 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 Coronary artery calcification.  Aortic valvular calcification. Biatrial dilatation.    Scattered mediastinal and hilar lymph nodes are subcentimeter in short axis  and likely reactive. Small bilateral pleural effusions and atelectasis.  Scarring within the lung bases. The lungs are otherwise clear. No  suspicious pulmonary nodule.    Peripherally calcified lesion within the spleen is statistically benign.  Degenerative change thoracic spine.      Impression:        1. No  detectable pulmonary embolism.  2. Small bilateral pleural effusions and atelectasis.    Launa Flight, MD  05/02/2023 5:07 PM    Chest AP Portable [[id number]] Collected: 05/02/23 1603    Order Status: Completed Updated: 05/02/23 1607    Narrative:  HISTORY: Shortness of breath     COMPARISON: 03/28/2023    FINDINGS:   Small left pleural effusion. No new consolidation. No pneumothorax.  Unchanged mediastinal silhouette. No acute osseous abnormality.  Redemonstrated round calcification projecting over the left upper quadrant.        Impression:         Small left pleural effusion.       August Albino  05/02/2023 4:05 PM     Assessment:   -Chest discomfort/orthopnea: with elevated NT-proBNP, chest CT with small bilateral pleural effusions, and evidence of fluid overload on exam with +1 pitting edema in BLEs and weight gain.   -HTN: presently on bisoprolol, valsartan, and doxazosin  -HLD: on atorvastatin  -GERD: on pantoprazole  -Hypothyroidism: on levothyroxine  -Coronary artery/aortic valve calcification on chest CTA 05/02/23    Plans:   Echocardiogram ordered to assess EF and valvular function.  Agree with IV diuresis, can start with IV Lasix 20 mg daily.   Monitor electrolytes and renal function. Replete for K>4 and Mg >2.  Strict I's and O's.  Daily standing weights.  BP control with goal <140 mmHg.  Continue Lipitor 10 mg daily.   Telemetry monitoring   Repeat hs-troponin for trending  Consider inpatient ischemic work-up - perhaps nuclear stress test on 7/1.    Payton Doughty, MD, [facility]  [facility] Cardiology   [facility] NP Spectralink [phone] or [phone] (M-F 8 am-5 pm)   Tyson Babinski [facility] Spectralink [phone]  (M-F 8 am-5 pm)

## 2023-05-03 NOTE — UM Notes (Addendum)
PATIENT NAME: Becky Burton, Becky Burton   DOB: [birth year]     Admission Order   Adult Admit to Inpatient (Order [phone]) on 05/02/23     Admit Diagnosis: Dyspnea [R06.00]  Pleural effusion [J90]  Elevated brain natriuretic peptide (BNP) level [R79.89]  Acute on chronic congestive heart failure, unspecified heart failure type [I50.9]  Facility: [facility] Telemetry    Reason for Admission   Becky Burton is a 77 y.o. female with H/O  HTN, hypothy hypothyroidism, hypercholesterolemia, came to the office complaining of chest pressure shortness of breath.  Patient had chest pressure last week also was advised to do the EKG or go to the ER at that time patient refused at.  Today patient came to the office complaining that she could shortness of breath worse with exertion.  She cannot lay down flat.  When she lays down she feels there is a pressure on her chest.  She has some cough  She might have a pneumonia.  Feet.  No fever or chills.  Patient had the symptoms for last 1 week.  EKG was done in the office which was unremarkable and clinical examination as patient was in CHF patient was advised to go to the emergency room.  In the ER patient was found to be in CHF therefore she is admitted to hospital for further evaluation and management.    Per Cardiology:  77 year old female from [place] with a history of hypertension, hyperlipidemia, GERD, hypothyroidism presenting to the ED for 3 day history of chest discomfort, dyspnea on exertion, and. No known history of CAD or CHF.      NT-proBNP 2300, CXR with small L pleural effusion   Initial hs-Trop 4.1  Na 134 (may be dilutional from CHF)  EKG pending   CTA chest pending      Recommendations:  Admit for gentle diuresis and further work-up  Recommend IV Lasix 20 mg. Close I&O monitoring   BP control with goal <140 mmHg  Echocardiogram ordered   Telemetry monitoring   Trend sodium   Repeat troponin for trending    Vital Signs  On RA  Temp:  [97.5 F (36.4 C)-98.6 F (37  C)] 98.1 F (36.7 C)  Heart Rate:  [50-92] 55  Resp Rate:  [17-20] 20  BP: (142-198)/(60-79) 150/67    Medications  Scheduled Meds:  Current Facility-Administered Medications   Medication Dose Route Frequency    atorvastatin  10 mg Oral Daily    bisoprolol  10 mg Oral Daily    doxazosin  2 mg Oral QHS    famotidine  40 mg Oral Daily    furosemide  20 mg Intravenous Daily    heparin (porcine)  5,000 Units Subcutaneous Q12H [facility]    levothyroxine  112 mcg Oral Daily at 0600    pantoprazole  40 mg Oral QAM AC    timolol  1 drop Both Eyes Daily    valsartan  320 mg Oral Daily     Abnormal Labs   05/02/23 15:40   Hemoglobin 10.8 (L)   Hematocrit 32.5 (L)   RBC 3.74 (L)   Platelet Clumps Present !   Albumin 3.4 (L)   NT-proBNP 2,322 (H)     Imaging   PCXR:  Small left pleural effusion     CT Angio Chest:  1. No detectable pulmonary embolism.  2. Small bilateral pleural effusions and atelectasis.    Plan of Care  Acute congestive heart failure.  Admit patient on telemetry.  Will get Lasix 20 mg IV daily.  As patient BNP level is 2300 and chest x-ray shows left pleural effusion.  Will get cardiology consult.  Get echocardiogram.  Fluid restriction to 1500 cc/day.  Low-salt diet.  Hypertension stable continue her NovoLog 10 mg once a day, Cardura 2 mg once a day hydralazine 25 every 6 as needed if systolic greater than 180.  And valsartan 320 mg once a day      ====== CSR 05/03/2023 ======      Patient remains inhouse for management of acute CHF exacerbation. Remains on IV Lasix.    Per Cardiology:  Chest discomfort/orthopnea: with elevated NT-proBNP, chest CT with small bilateral pleural effusions, and evidence of fluid overload on exam with +1 pitting edema in BLEs and weight gain.     Vital Signs  On RA  BP 150/67   Pulse (!) 55   Temp 98.1 F (36.7 C) (Oral)   Resp 20   Ht 1.499 m (4\' 11" )   Wt 76.5 kg (168 lb 11.2 oz)   SpO2 95%   BMI 34.07 kg/m     Medications  Scheduled Meds:  Current Facility-Administered  Medications   Medication Dose Route Frequency    atorvastatin  10 mg Oral Daily    bisoprolol  10 mg Oral Daily    doxazosin  2 mg Oral QHS    famotidine  40 mg Oral Daily    furosemide  20 mg Intravenous Daily    heparin (porcine)  5,000 Units Subcutaneous Q12H [facility]    levothyroxine  112 mcg Oral Daily at 0600    pantoprazole  40 mg Oral QAM AC    timolol  1 drop Both Eyes Daily    valsartan  320 mg Oral Daily     Abnormal Labs   05/03/23 04:34   Hemoglobin 9.8 (L)   Hematocrit 29.9 (L)   RBC 3.39 (L)     Plan of Care  Echocardiogram ordered to assess EF and valvular function.  Agree with IV diuresis, can start with IV Lasix 20 mg daily.   Monitor electrolytes and renal function. Replete for K>4 and Mg >2.  Strict I's and O's.  Daily standing weights.  BP control with goal <140 mmHg.  Continue Lipitor 10 mg daily.   Telemetry monitoring   Repeat hs-troponin for trending  Consider inpatient ischemic work-up - perhaps nuclear stress test on 7/1.          Primary Coverage:  Payor: MEDICARE / Plan: MEDICARE PART A AND B / Product Type: Medicare /     UTILIZATION REVIEW CONTACT: Vernie Murders, RN, BSN  Utilization Review   [facility] Systems  [phone]  [phone]  Email: [email or web address]  Tax ID:  [id number]

## 2023-05-03 NOTE — Progress Notes (Signed)
PROGRESS NOTE    Date Time: 05/03/23 12:10 PM  Patient Name: Becky Burton, Becky Burton    Patient Active Problem List   Diagnosis    Dyspnea             Assessment/ Plan:   Chau Mckearney is a 77 y.o. female with  Acute congestive heart failure. Secondary to Grade  III diastolic dysfunction (restrictive pattern) with elevated left atrial  Cont Lasix 20 mg IV daily.   Fluid restriction to 1500 cc/day.  Low-salt diet.  Hypertension stable continue her NovoLog 10 mg once a day, Cardura 2 mg once a day hydralazine 25 every 6 as needed if systolic greater than 180.  And valsartan 320 mg once a day     Hypothyroidism continue Synthroid 112 mcg once a day     Hypercholesterolemia continue Lipitor 10 mg once a day  Gastroesophageal reflux disease continue her Protonix and famotidine  Mild hyponatremia most likely due to fluid overload will repeat labs in the morning.  Thrombocytopenia    Anemia, work up ordered    DVT prophylaxis  SCD/TEDS bilateral LE            All labs/ notes from last 24 hrs reviewed  Will continue rest of the plan same  Care Plan  D/W nursing,consultants,case manager,Family     This note was generated by the Epic EMR system/ Dragon speech recognition and may contain inherent errors or omissions not intended by the user. Grammatical errors, random word insertions, deletions, pronoun errors and incomplete sentences are occasional consequences of this technology due to software limitations. Not all errors are caught or corrected. If there are questions or concerns about the content of this note or information contained within the body of this dictation they should be addressed directly with the author for clarification      Subjective:   Patient Seen and Examined. The notes from the last 24 hours were reviewed      Medications:     Current Facility-Administered Medications   Medication Dose Route Frequency    atorvastatin  10 mg Oral Daily    bisoprolol  10 mg Oral Daily    doxazosin  2 mg Oral QHS     famotidine  40 mg Oral Daily    furosemide  20 mg Intravenous Daily    heparin (porcine)  5,000 Units Subcutaneous Q12H [facility]    levothyroxine  112 mcg Oral Daily at 0600    pantoprazole  40 mg Oral QAM AC    timolol  1 drop Both Eyes Daily    valsartan  320 mg Oral Daily             Review of Systems:     A comprehensive review of systems was: obtained from nurse ,chart review and the patient   General ROS: negative for - fatigue , lightheadedness.or dizziness blurry vision  ENT ROS: negative for - headaches, nasal congestion or tinnitus, no sore throat, no ear ache   Gastrointestinal ROS: no abdominal pain,   no nausea vomiting  Neurological ROS: negative for - , headaches,  numbness/tingling   GU,nofrequency , urgency ,or burning micturation.              Physical Exam:     Patient Vitals for the past 24 hrs:   BP Temp Temp src Pulse Resp SpO2 Height Weight   05/03/23 1130 150/67 98.1 F (36.7 C) Oral (!) 55 20 95 % -- --   05/03/23 0751  198/68 98.4 F (36.9 C) Oral (!) 56 18 95 % -- --   05/03/23 0638 -- -- -- -- -- -- -- 76.5 kg (168 lb 11.2 oz)   05/03/23 0347 172/75 98.2 F (36.8 C) Oral (!) 52 17 91 % -- --   05/03/23 0013 142/60 98.1 F (36.7 C) Oral (!) 50 18 94 % -- --   05/02/23 2127 180/69 -- -- (!) 57 -- -- -- --   05/02/23 2126 192/63 -- -- 60 -- -- -- --   05/02/23 1942 186/71 98.2 F (36.8 C) Oral (!) 59 18 94 % -- --   05/02/23 1912 -- -- -- -- -- -- -- 76.4 kg (168 lb 8 oz)   05/02/23 1835 -- -- -- -- -- -- 1.499 m (4\' 11" ) --   05/02/23 1825 187/74 97.5 F (36.4 C) Oral 62 -- 94 % -- --   05/02/23 1730 171/71 -- -- 60 18 95 % -- --   05/02/23 1700 161/79 98.6 F (37 C) Oral 60 20 96 % -- --   05/02/23 1511 170/78 98 F (36.7 C) Oral 92 20 98 % -- 77 kg (169 lb 12.1 oz)     Intake and Output Summary (Last 24 hours) at Date Time    Intake/Output Summary (Last 24 hours) at 05/03/2023 1210  Last data filed at 05/03/2023 1610  Gross per 24 hour   Intake --   Output 100 ml   Net -100 ml        General Appearance-Well appearing, no acute distress  HEENT- PERRLA, EOMI, Sclera clear. Oropharynx -normal   Neck-Supple, no masses or abnormal adenopathy. No JVD or bruits.  Lungs- Bilaterally symmetrical equal in expansion Clear,on auscultation bilateral  crackles   CV- RRR, normal S1, S2, no rubs/murmurs/gallops1 plus edemas  Abdomen-Not distended, soft non tender. +BS, No masses palpated. No HSM  Neuro -Alert oriented no focal motor sensory deficit  Muscloskeletal-No cyanosis, clubbing, or edema, periopheral pulsus positive  no joint tenderness, deformity or swelling  Skin no lesion warm and dry   Psych mood and affects are appropriate    Labs:     Recent Labs   Lab 05/03/23  0434 05/02/23  1540   WBC 5.22 6.16   RBC 3.39* 3.74*   Hemoglobin 9.8* 10.8*   Hematocrit 29.9* 32.5*   MCV 88.2 86.9   Platelet Count 245 115*       Recent Labs   Lab 05/03/23  0434 05/02/23  1540   Sodium 135 134*   Potassium 4.0 3.9   Chloride 101 99   CO2 25 27   BUN 7 8   Creatinine 0.7 0.7   Glucose 85 108*   Calcium 8.5 9.0             Invalid input(s): "OSMOL"    Recent Labs   Lab 05/02/23  1540   ALT 9   AST (SGOT) 14   Bilirubin, Total 0.5   Albumin 3.4*   Alkaline Phosphatase 53       Recent Labs   Lab 05/02/23  1540   hs Troponin 4.1           Microbiology, reviewed and are significant for:  Microbiology Results (last 15 days)       Procedure Component Value Units Date/Time    COVID-19 and Influenza ([place]) (symptomatic) [[id number]]  (Normal) Collected: 05/02/23 1542    Order Status: Completed Specimen: Swab from Anterior Nares Updated: 05/02/23 1645  SARS-CoV-2 (COVID-19) RNA Not Detected     Influenza A RNA Not Detected     Influenza B RNA Not Detected    Narrative:      A result of "Detected" indicates POSITIVE for the presence of viral RNA  A result of "Not Detected" indicates NEGATIVE for the presence of viral RNA    Test performed using the Roche cobas [place] SARS-CoV-2 & Influenza A/B assay. This is a multiplex  real-time RT-PCR assay for the detection of SARS-CoV-2, influenza A, and influenza B virus RNA. Viral nucleic acids may persist in vivo, independent of viability. Detection of viral nucleic acid does not imply the presence of infectious virus, or that virus nucleic acid is the cause of clinical symptoms. Negative results do not preclude SARS-CoV-2, influenza A, and/or influenza B infection and should not be used as the sole basis for diagnosis, treatment or other patient management decisions. Invalid results may be due to inhibiting substances in the specimen and recollection should occur.                     No results for input(s): "CXBLD" in the last 24 hours.  Invalid input(s): "CXURN"    Recent BLOOD CULTURE No results for input(s): "CXBLD" in the last 24 hours.  Recent URINE CULTURE Invalid input(s): "CXURN"    Cardiac Enzymes   Recent Labs   Lab 05/02/23  1540   hs Troponin 4.1       No results found for: "BNP"       Thyroid Studies          No results for input(s): "CXBLD" in the last 24 hours.  Invalid input(s): "CXURN"    Urinalysis         Invalid input(s): "LEUKOCYTESUR"           MICROBIOLOGY  No results for input(s): "CXBLD" in the last 24 hours.  Invalid input(s): "CXURN"          Rads:   Radiological Procedure reviewed.  Radiology Results (24 Hour)       Procedure Component Value Units Date/Time    CT Angio Chest (PE study) [[id number]] Collected: 05/02/23 1704    Order Status: Completed Updated: 05/02/23 1709    Narrative:      HISTORY: Chest pain    COMPARISON: None.    TECHNIQUE:  CT angiogram of the chest performed with intravenous contrast.  Multiplanar reformatted and 3D maximum intensity projection (MIP) images  were created and reviewed. The following dose reduction techniques were  utilized: automated exposure control and/or adjustment of the mA and/or KV  according to patient size, and the use of an iterative reconstruction  technique.    CONTRAST: iohexol (OMNIPAQUE) 350 MG/ML injection 80  mL    FINDINGS:  No detectable pulmonary embolism. 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 Coronary artery calcification.  Aortic valvular calcification. Biatrial dilatation.    Scattered mediastinal and hilar lymph nodes are subcentimeter in short axis  and likely reactive. Small bilateral pleural effusions and atelectasis.  Scarring within the lung bases. The lungs are otherwise clear. No  suspicious pulmonary nodule.    Peripherally calcified lesion within the spleen is statistically benign.  Degenerative change thoracic spine.      Impression:        1. No detectable pulmonary embolism.  2. Small bilateral pleural effusions and atelectasis.    Launa Flight, MD  05/02/2023 5:07 PM    Chest AP  Portable [[id number]] Collected: 05/02/23 1603    Order Status: Completed Updated: 05/02/23 1607    Narrative:      HISTORY: Shortness of breath     COMPARISON: 03/28/2023    FINDINGS:   Small left pleural effusion. No new consolidation. No pneumothorax.  Unchanged mediastinal silhouette. No acute osseous abnormality.  Redemonstrated round calcification projecting over the left upper quadrant.        Impression:         Small left pleural effusion.       August Albino  05/02/2023 4:05 PM             Signed by: Lurline Idol, MD

## 2023-05-04 DIAGNOSIS — R0789 Other chest pain: Secondary | ICD-10-CM

## 2023-05-04 DIAGNOSIS — I10 Essential (primary) hypertension: Secondary | ICD-10-CM

## 2023-05-04 DIAGNOSIS — E785 Hyperlipidemia, unspecified: Secondary | ICD-10-CM

## 2023-05-04 DIAGNOSIS — R0600 Dyspnea, unspecified: Secondary | ICD-10-CM

## 2023-05-04 DIAGNOSIS — R609 Edema, unspecified: Secondary | ICD-10-CM

## 2023-05-04 DIAGNOSIS — K219 Gastro-esophageal reflux disease without esophagitis: Secondary | ICD-10-CM

## 2023-05-04 LAB — IRON PROFILE
Iron Saturation: 20 % (ref 15–50)
Iron: 50 ug/dL (ref 32–157)
TIBC: 250 ug/dL — ABNORMAL LOW (ref 265–497)
UIBC: 200 ug/dL (ref 126–382)

## 2023-05-04 LAB — LAB USE ONLY - CBC WITH DIFFERENTIAL
Absolute Basophils: 0.06 10*3/uL (ref 0.00–0.08)
Absolute Eosinophils: 0.26 10*3/uL (ref 0.00–0.44)
Absolute Immature Granulocytes: 0.01 10*3/uL (ref 0.00–0.07)
Absolute Lymphocytes: 1.92 10*3/uL (ref 0.42–3.22)
Absolute Monocytes: 0.66 10*3/uL (ref 0.21–0.85)
Absolute Neutrophils: 3.64 10*3/uL (ref 1.10–6.33)
Absolute nRBC: 0 10*3/uL (ref <=0.00)
Basophils %: 0.9 %
Eosinophils %: 4 %
Hematocrit: 30.2 % — ABNORMAL LOW (ref 34.7–43.7)
Hemoglobin: 10.1 g/dL — ABNORMAL LOW (ref 11.4–14.8)
Immature Granulocytes %: 0.2 %
Lymphocytes %: 29.3 %
MCH: 28.9 pg (ref 25.1–33.5)
MCHC: 33.4 g/dL (ref 31.5–35.8)
MCV: 86.3 fL (ref 78.0–96.0)
MPV: 10.7 fL (ref 8.9–12.5)
Monocytes %: 10.1 %
Neutrophils %: 55.5 %
Platelet Count: 245 10*3/uL (ref 142–346)
Preliminary Absolute Neutrophil Count: 3.64 10*3/uL (ref 1.10–6.33)
RBC: 3.5 10*6/uL — ABNORMAL LOW (ref 3.90–5.10)
RDW: 14 % (ref 11–15)
WBC: 6.55 10*3/uL (ref 3.10–9.50)
nRBC %: 0 /100 WBC (ref <=0.0)

## 2023-05-04 LAB — COMPREHENSIVE METABOLIC PANEL
ALT: 8 U/L (ref 0–55)
AST (SGOT): 11 U/L (ref 5–41)
Albumin/Globulin Ratio: 1.2 (ref 0.9–2.2)
Albumin: 3.2 g/dL — ABNORMAL LOW (ref 3.5–5.0)
Alkaline Phosphatase: 48 U/L (ref 37–117)
Anion Gap: 8 (ref 5.0–15.0)
BUN: 8 mg/dL (ref 7–21)
Bilirubin, Total: 0.6 mg/dL (ref 0.2–1.2)
CO2: 25 mEq/L (ref 17–29)
Calcium: 8.8 mg/dL (ref 7.9–10.2)
Chloride: 102 mEq/L (ref 99–111)
Creatinine: 0.6 mg/dL (ref 0.4–1.0)
GFR: >60 mL/min/{1.73_m2} (ref >=60.0)
Globulin: 2.6 g/dL (ref 2.0–3.6)
Glucose: 90 mg/dL (ref 70–100)
Potassium: 3.6 mEq/L (ref 3.5–5.3)
Protein, Total: 5.8 g/dL — ABNORMAL LOW (ref 6.0–8.3)
Sodium: 135 mEq/L (ref 135–145)

## 2023-05-04 LAB — LIPID PANEL
Cholesterol / HDL Ratio: 2.1 Index
Cholesterol: 146 mg/dL (ref <=199)
HDL: 70 mg/dL (ref >=40)
LDL Calculated: 65 mg/dL (ref 0–129)
Triglycerides: 57 mg/dL (ref 34–149)
VLDL Calculated: 11 mg/dL (ref 10–40)

## 2023-05-04 LAB — FERRITIN: Ferritin: 64.75 ng/mL (ref 4.60–204.00)

## 2023-05-04 LAB — TSH: TSH: 0.93 u[IU]/mL (ref 0.35–4.94)

## 2023-05-04 LAB — VITAMIN B12: Vitamin B-12: 201 pg/mL — ABNORMAL LOW (ref 211–911)

## 2023-05-04 MED ORDER — HYDRALAZINE HCL 20 MG/ML IJ SOLN
10.0000 mg | Freq: Four times a day (QID) | INTRAMUSCULAR | Status: DC | PRN
Start: 2023-05-04 — End: 2023-05-08
  Administered 2023-05-04 (×2): 10 mg via INTRAVENOUS
  Filled 2023-05-04 (×3): qty 1

## 2023-05-04 MED ORDER — HYDRALAZINE HCL 25 MG PO TABS
25.0000 mg | ORAL_TABLET | Freq: Three times a day (TID) | ORAL | Status: DC
Start: 2023-05-04 — End: 2023-05-04

## 2023-05-04 MED ORDER — HYDRALAZINE HCL 50 MG PO TABS
50.0000 mg | ORAL_TABLET | Freq: Three times a day (TID) | ORAL | Status: DC
Start: 2023-05-04 — End: 2023-05-07
  Administered 2023-05-04 – 2023-05-06 (×8): 50 mg via ORAL
  Filled 2023-05-04 (×8): qty 1

## 2023-05-04 NOTE — Progress Notes (Signed)
Initial Case Management Assessment and Discharge Planning  [facility]   Patient Name: Becky Burton, Becky Burton   Date of Birth 01-18-1946   Attending Physician: Lurline Idol, MD   Primary Care Physician: Lurline Idol, MD   Length of Stay 2   Reason for Consult / Chief Complaint Discharge planning        Situation   Admission DX:   1. Acute on chronic congestive heart failure, unspecified heart failure type    2. Dyspnea    3. Pleural effusion    4. Elevated brain natriuretic peptide (BNP) level        A/O Status: X 3    Patient admitted from: ER  Admission Status: inpatient    Health Care Agent: Child  Name: Arletha Trevithick  [id number]       Background     Advanced directive:   <no information>    Code Status:   NO CPR - SUPPORT OK   Residence: Apartment    PCP: Lurline Idol, MD  Patient Contact:   [phone] (home)     [phone] (mobile)     Emergency contact:   Extended Emergency Contact Information  Primary Emergency Contact: [facility]  Mobile Phone: [phone]  Relation: Son      ADL/IADL's: Independent  Previous Level of function: 7 Independent     DME: None    Pharmacy:     [facility] [phone] - [place], [place] - [address] AT [facility] [address]  [address]  [place] [address]  Phone: [phone] Fax: [phone]      Prescription Coverage: Yes    Home Health: The patient is not currently receiving home health services.    COVID Vaccine Status: n/a    [facility] IMM given: assigned to registration  UAI on file?: n/a  Transport for discharge? Mode of transportation: Sales executive - Family/Friend to drive patient  Agreeable to Home with family post-discharge:  Yes     Assessment   This case manager met with patient/ at bedside to review information and discuss discharge planning.  Verified name, date of birth, address, emergency contacts, PCP provider and insurance.  Patient lives in [place], visiting her son in [place] until August 2024- reports independence w/ ADLs  and mobility.  Patient has DME- none  PCP was last seen "Feb 2024"      Patient does have an advanced directive (AD).-stated she provided documents to nursing staff- alerted bedside of this    Upon d/c patient's son will transport patient home.    Case Management will continue to monitor patient's progression of care, d/c needs and possible barriers to discharge.    [facility] BSN,RN  Clinical Case Manager I  [facility]  Case Management Department  [address]  [place], [place] [address]  [phone]  Fax     [phone]  [email or web address]      BARRIERS TO DISCHARGE: pending medical discharge     Recommendation   D/C Plan A: Home with family    D/C Plan B: tbd    D/C Plan C: tbd

## 2023-05-04 NOTE — Plan of Care (Signed)
Problem: Inadequate Gas Exchange  Goal: Adequate oxygenation and improved ventilation  Flowsheets (Taken 05/04/2023 2211)  Adequate oxygenation and improved ventilation:   Assess lung sounds   Position for maximum ventilatory efficiency   Teach/reinforce use of incentive spirometer 10 times per hour while awake, cough and deep breath as needed   Increase activity as tolerated/progressive mobility

## 2023-05-04 NOTE — H&P (Addendum)
PROGRESS NOTE     Date Time: 05/03/23 12:10 PM  Patient Name: Becky Burton, Becky Burton         Patient Active Problem List   Diagnosis    Dyspnea                  Assessment/ Plan:   Becky Burton is a 77 y.o. female with  Acute congestive heart failure. Secondary to Grade  III diastolic dysfunction (restrictive pattern) with elevated left atrial pressure   Cont Lasix 20 mg IV daily.   Fluid restriction to 1500 cc/day.  Low-salt diet., further plan as per cardiology  Hypertension stable continue her NovoLog 10 mg once a day, Cardura 2 mg once a day ,Increase hydralazine 50 mg every 8 as valsartan 320 mg once a day     Hypothyroidism continue Synthroid 112 mcg once a day     Hypercholesterolemia continue Lipitor 10 mg once a day  Gastroesophageal reflux disease continue her Protonix and famotidine  Mild hyponatremia most likely due to fluid overload will repeat labs in the morning.  Thrombocytopenia     Anemia, work up ordered   Obesity   DVT prophylaxis  SCD/TEDS bilateral LE             All labs/ notes from last 24 hrs reviewed  Will continue rest of the plan same  Care Plan  D/W nursing,consultants,case manager     This note was generated by the Epic EMR system/ Dragon speech recognition and may contain inherent errors or omissions not intended by the user. Grammatical errors, random word insertions, deletions, pronoun errors and incomplete sentences are occasional consequences of this technology due to software limitations. Not all errors are caught or corrected. If there are questions or concerns about the content of this note or information contained within the body of this dictation they should be addressed directly with the author for clarification        Subjective:   Patient Seen and Examined. The notes from the last 24 hours were reviewed   pt still feels SOB, cannot lay flat ,denies any Chest pain     Medications:             Current Facility-Administered Medications   Medication Dose Route  Frequency    atorvastatin  10 mg Oral Daily    bisoprolol  10 mg Oral Daily    doxazosin  2 mg Oral QHS    famotidine  40 mg Oral Daily    furosemide  20 mg Intravenous Daily    heparin (porcine)  5,000 Units Subcutaneous Q12H [facility]    levothyroxine  112 mcg Oral Daily at 0600    pantoprazole  40 mg Oral QAM AC    timolol  1 drop Both Eyes Daily    valsartan  320 mg Oral Daily                  Review of Systems:      A comprehensive review of systems was: obtained from nurse ,chart review and the patient   General ROS: negative for - fatigue , lightheadedness.or dizziness blurry vision  ENT ROS: negative for - headaches, nasal congestion or tinnitus, no sore throat, no ear ache   Gastrointestinal ROS: no abdominal pain,   no nausea vomiting  Neurological ROS: negative for - , headaches,  numbness/tingling   GU,nofrequency , urgency ,or burning micturation.  Physical Exam:      Patient Vitals for the past 24 hrs:    BP Temp Temp src Pulse Resp SpO2 Height Weight   05/03/23 1130 150/67 98.1 F (36.7 C) Oral (!) 55 20 95 % -- --   05/03/23 0751 198/68 98.4 F (36.9 C) Oral (!) 56 18 95 % -- --   05/03/23 0638 -- -- -- -- -- -- -- 76.5 kg (168 lb 11.2 oz)   05/03/23 0347 172/75 98.2 F (36.8 C) Oral (!) 52 17 91 % -- --   05/03/23 0013 142/60 98.1 F (36.7 C) Oral (!) 50 18 94 % -- --   05/02/23 2127 180/69 -- -- (!) 57 -- -- -- --   05/02/23 2126 192/63 -- -- 60 -- -- -- --   05/02/23 1942 186/71 98.2 F (36.8 C) Oral (!) 59 18 94 % -- --   05/02/23 1912 -- -- -- -- -- -- -- 76.4 kg (168 lb 8 oz)   05/02/23 1835 -- -- -- -- -- -- 1.499 m (4\' 11" ) --   05/02/23 1825 187/74 97.5 F (36.4 C) Oral 62 -- 94 % -- --   05/02/23 1730 171/71 -- -- 60 18 95 % -- --   05/02/23 1700 161/79 98.6 F (37 C) Oral 60 20 96 % -- --   05/02/23 1511 170/78 98 F (36.7 C) Oral 92 20 98 % -- 77 kg (169 lb 12.1 oz)      Intake and Output Summary (Last 24 hours) at Date Time     Intake/Output Summary (Last 24 hours) at  05/03/2023 1210  Last data filed at 05/03/2023 1610      Gross per 24 hour   Intake --   Output 100 ml   Net -100 ml         General Appearance-Well appearing, no acute distress  HEENT- PERRLA, EOMI, Sclera clear. Oropharynx -normal   Neck-Supple, no masses or abnormal adenopathy. No JVD or bruits.  Lungs- Bilaterally symmetrical equal in expansion on auscultation bilateral  basal crackles   CV- RRR, normal S1, S2, no rubs/murmurs/gallops1 plus edemas  Abdomen-Not distended, soft non tender. +BS, No masses palpated. No HSM  Neuro -Alert oriented no focal motor sensory deficit  Muscloskeletal-No cyanosis, clubbing, or edema, periopheral pulsus positive  no joint tenderness, deformity or swelling  Skin no lesion warm and dry   Psych mood and affects are appropriate     Labs:           Recent Labs   Lab 05/03/23  0434 05/02/23  1540   WBC 5.22 6.16   RBC 3.39* 3.74*   Hemoglobin 9.8* 10.8*   Hematocrit 29.9* 32.5*   MCV 88.2 86.9   Platelet Count 245 115*              Recent Labs   Lab 05/03/23  0434 05/02/23  1540   Sodium 135 134*   Potassium 4.0 3.9   Chloride 101 99   CO2 25 27   BUN 7 8   Creatinine 0.7 0.7   Glucose 85 108*   Calcium 8.5 9.0                Invalid input(s): "OSMOL"         Recent Labs   Lab 05/02/23  1540   ALT 9   AST (SGOT) 14   Bilirubin, Total 0.5   Albumin 3.4*   Alkaline Phosphatase 53  Recent Labs   Lab 05/02/23  1540   hs Troponin 4.1             Microbiology, reviewed and are significant for:  Microbiology Results (last 15 days)         Procedure Component Value Units Date/Time     COVID-19 and Influenza ([place]) (symptomatic) [[id number]]  (Normal) Collected: 05/02/23 1542     Order Status: Completed Specimen: Swab from Anterior Nares Updated: 05/02/23 1645       SARS-CoV-2 (COVID-19) RNA Not Detected       Influenza A RNA Not Detected       Influenza B RNA Not Detected     Narrative:       A result of "Detected" indicates POSITIVE for the presence of viral RNA  A result of "Not  Detected" indicates NEGATIVE for the presence of viral RNA     Test performed using the Roche cobas [place] SARS-CoV-2 & Influenza A/B assay. This is a multiplex real-time RT-PCR assay for the detection of SARS-CoV-2, influenza A, and influenza B virus RNA. Viral nucleic acids may persist in vivo, independent of viability. Detection of viral nucleic acid does not imply the presence of infectious virus, or that virus nucleic acid is the cause of clinical symptoms. Negative results do not preclude SARS-CoV-2, influenza A, and/or influenza B infection and should not be used as the sole basis for diagnosis, treatment or other patient management decisions. Invalid results may be due to inhibiting substances in the specimen and recollection should occur.                         No results for input(s): "CXBLD" in the last 24 hours.  Invalid input(s): "CXURN"     Recent BLOOD CULTURE No results for input(s): "CXBLD" in the last 24 hours.  Recent URINE CULTURE Invalid input(s): "CXURN"     Cardiac Enzymes       Recent Labs   Lab 05/02/23  1540   hs Troponin 4.1         No results found for: "BNP"         Thyroid Studies              No results for input(s): "CXBLD" in the last 24 hours.  Invalid input(s): "CXURN"     Urinalysis          Invalid input(s): "LEUKOCYTESUR"               MICROBIOLOGY  No results for input(s): "CXBLD" in the last 24 hours.  Invalid input(s): "CXURN"              Rads:   Radiological Procedure reviewed.  Radiology Results (24 Hour)         Procedure Component Value Units Date/Time     CT Angio Chest (PE study) [[id number]] Collected: 05/02/23 1704     Order Status: Completed Updated: 05/02/23 1709     Narrative:       HISTORY: Chest pain     COMPARISON: None.     TECHNIQUE:  CT angiogram of the chest performed with intravenous contrast.  Multiplanar reformatted and 3D maximum intensity projection (MIP) images  were created and reviewed. The following dose reduction techniques were  utilized: automated  exposure control and/or adjustment of the mA and/or KV  according to patient size, and the use of an iterative reconstruction  technique.     CONTRAST: iohexol (OMNIPAQUE) 350 MG/ML  injection 80 mL     FINDINGS:  No detectable pulmonary embolism. 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 Coronary artery calcification.  Aortic valvular calcification. Biatrial dilatation.     Scattered mediastinal and hilar lymph nodes are subcentimeter in short axis  and likely reactive. Small bilateral pleural effusions and atelectasis.  Scarring within the lung bases. The lungs are otherwise clear. No  suspicious pulmonary nodule.     Peripherally calcified lesion within the spleen is statistically benign.  Degenerative change thoracic spine.        Impression:          1. No detectable pulmonary embolism.  2. Small bilateral pleural effusions and atelectasis.     Launa Flight, MD  05/02/2023 5:07 PM     Chest AP Portable [[id number]] Collected: 05/02/23 1603     Order Status: Completed Updated: 05/02/23 1607     Narrative:       HISTORY: Shortness of breath      COMPARISON: 03/28/2023     FINDINGS:   Small left pleural effusion. No new consolidation. No pneumothorax.  Unchanged mediastinal silhouette. No acute osseous abnormality.  Redemonstrated round calcification projecting over the left upper quadrant.           Impression:          Small left pleural effusion.        August Albino  05/02/2023 4:05 PM                Signed by: Lurline Idol, MD

## 2023-05-04 NOTE — Plan of Care (Signed)
Problem: Inadequate Gas Exchange  Goal: Adequate oxygenation and improved ventilation  Outcome: Progressing  Flowsheets (Taken 05/04/2023 0108)  Adequate oxygenation and improved ventilation:   Assess lung sounds   Monitor SpO2 and treat as needed   Position for maximum ventilatory efficiency   Increase activity as tolerated/progressive mobility     Problem: Hemodynamic Status: Cardiac  Goal: Stable vital signs and fluid balance  Outcome: Progressing  Flowsheets (Taken 05/04/2023 0108)  Stable vital signs and fluid balance:   Assess signs and symptoms associated with cardiac rhythm changes   Monitor lab values

## 2023-05-04 NOTE — Plan of Care (Signed)
Problem: Safety  Goal: Patient will be free from injury during hospitalization  Outcome: Progressing  Flowsheets (Taken 05/04/2023 1506)  Patient will be free from injury during hospitalization:   Assess patient's risk for falls and implement fall prevention plan of care per policy   Provide and maintain safe environment   Use appropriate transfer methods   Ensure appropriate safety devices are available at the bedside   Include patient/ family/ care giver in decisions related to safety     Problem: Hemodynamic Status: Cardiac  Goal: Stable vital signs and fluid balance  Outcome: Progressing  Flowsheets (Taken 05/04/2023 1506)  Stable vital signs and fluid balance:   Assess signs and symptoms associated with cardiac rhythm changes   Monitor lab values     Problem: Moderate/High Fall Risk Score >5  Goal: Patient will remain free of falls  Outcome: Progressing  Flowsheets (Taken 05/04/2023 1506)  VH Moderate Risk (6-13): Remain with patient during toileting

## 2023-05-04 NOTE — Progress Notes (Signed)
[place] [facility] PROGRESS NOTE     Date Time:    05/04/23    8:48 AM  Patient Name: Francenia Mccullar    LOS: 2 days     Subjective:  Patient is out of bed, sitting up in chair. She appears short of breath with exertion after coming from the bathroom. She denies CP, palpitations, or lightheadedness.     I's and O's: net negative 1.2 L.    Scheduled Meds:   atorvastatin, 10 mg, Oral, Daily  bisoprolol, 10 mg, Oral, Daily  doxazosin, 2 mg, Oral, QHS  famotidine, 40 mg, Oral, Daily  furosemide, 20 mg, Intravenous, Daily  heparin (porcine), 5,000 Units, Subcutaneous, Q12H [facility]  levothyroxine, 112 mcg, Oral, Daily at 0600  pantoprazole, 40 mg, Oral, QAM AC  timolol, 1 drop, Both Eyes, Daily  valsartan, 320 mg, Oral, Daily    PRN Meds: hydrALAZINE    Physical Exam:  BP 176/71   Pulse (!) 55   Temp 98.4 F (36.9 C) (Oral)   Resp 17   Ht 1.499 m (4\' 11" )   Wt 76 kg (167 lb 9.6 oz)   SpO2 93%   BMI 33.85 kg/m      Intake/Output Summary (Last 24 hours) at 05/04/2023 0700  Last data filed at 05/04/2023 0600  Gross per 24 hour   Intake --   Output 1100 ml   Net -1100 ml     Wt Readings from Last 1 Encounters:   05/04/23 0620 76 kg (167 lb 9.6 oz)   05/03/23 0638 76.5 kg (168 lb 11.2 oz)   05/02/23 1912 76.4 kg (168 lb 8 oz)   05/02/23 1511 77 kg (169 lb 12.1 oz)     Lab Review:   Recent Labs     05/04/23  0441 05/03/23  0434 05/02/23  1540   WBC 6.55 5.22 6.16   Hemoglobin 10.1* 9.8* 10.8*   Hematocrit 30.2* 29.9* 32.5*   Platelet Count 245 245 115*     Recent Labs     05/02/23  1540   hs Troponin 4.1    Recent Labs     05/04/23  0441 05/03/23  0434 05/02/23  1540   Sodium 135 135 134*   Potassium 3.6 4.0 3.9   CO2 25 25 27    BUN 8 7 8    Creatinine 0.6 0.7 0.7   Glucose 90 85 108*    Estimated Creatinine Clearance: 69.8 mL/min (based on SCr of 0.6 mg/dL).        Telemetry:  NSR.     Echocardiogram:    * Left ventricular systolic function is normal with an estimated ejection  fraction of 60-65%. Diastolic  dysfunction exists.    * The left atrium is moderately dilated.    * Moderate pulmonary hypertension with estimated right ventricular systolic  pressure of  51 mmHg.    Assessment:   Naibe Kracht is a 77 y.o. female with a history of HTN, HLD, GERD, and hypothyroidism who presented to the hospital on 05/02/2023 with chest discomfort and DOE.      Patient reports that she originally went to [facility] for a rash.  She was found to be hypertensive with blood pressures in the 200s systolic.  She was then referred to [facility] for further evaluation.      She reports that she experienced left-sided chest heaviness, and difficulty breathing recently. She noticed she had occasional wheezing that she did not have previously.  She reports  she has had difficulty sleeping due to her breathing, and was unable to lie comfortably. Patient reports she sleeps on 2 pillows at night.       In the ED, her NT-ProBNP was elevated 2,322. Chest CT revealed small bilateral pleural effusions and atelectasis. CT was negative for P.E.     -Chest discomfort/orthopnea: with elevated NT-proBNP, chest CT with small bilateral pleural effusions, and evidence of fluid overload on exam with +1 pitting edema in BLEs and weight gain.   -HFpEF: with G3DD  -Moderate pulmonary HTN on echo 05/03/23 (with no detectable pulmonary embolism on chest CTA on 6/28)  -HTN: presently on bisoprolol, valsartan, and doxazosin  -HLD: on atorvastatin  -GERD: on pantoprazole  -Hypothyroidism: on levothyroxine  -Coronary artery/aortic valve calcification on chest CTA 05/02/23     Plans:   Continue diuresis with IV furosemide 20 mg daily.   Monitor electrolytes and renal function. Replete for K >4 and Mg >2.  I's and O's; daily weights.  BP control with goal <140 mmHg.  Continue atorvastatin.  Telemetry monitoring   Nuclear stress test tomorrow (order placed, and made NPO after midnight tonight, but not yet scheduled for a specific time).    Payton Doughty,  MD, [facility]  [facility]   [facility] NP Spectralink [phone] or [phone] (M-F 8 am-5 pm)   [facility] Spectralink [phone]  (M-F 8 am-5 pm)  After Hours On-Call Physician: [phone]

## 2023-05-05 ENCOUNTER — Other Ambulatory Visit: Payer: Self-pay

## 2023-05-05 DIAGNOSIS — I5031 Acute diastolic (congestive) heart failure: Secondary | ICD-10-CM

## 2023-05-05 DIAGNOSIS — I110 Hypertensive heart disease with heart failure: Secondary | ICD-10-CM

## 2023-05-05 DIAGNOSIS — I160 Hypertensive urgency: Secondary | ICD-10-CM

## 2023-05-05 DIAGNOSIS — R001 Bradycardia, unspecified: Secondary | ICD-10-CM

## 2023-05-05 DIAGNOSIS — D649 Anemia, unspecified: Secondary | ICD-10-CM

## 2023-05-05 DIAGNOSIS — D518 Other vitamin B12 deficiency anemias: Secondary | ICD-10-CM | POA: Diagnosis not present

## 2023-05-05 LAB — LAB USE ONLY - CBC WITH DIFFERENTIAL
Absolute Basophils: 0.06 10*3/uL (ref 0.00–0.08)
Absolute Eosinophils: 0.3 10*3/uL (ref 0.00–0.44)
Absolute Immature Granulocytes: 0.01 10*3/uL (ref 0.00–0.07)
Absolute Lymphocytes: 2.24 10*3/uL (ref 0.42–3.22)
Absolute Monocytes: 0.64 10*3/uL (ref 0.21–0.85)
Absolute Neutrophils: 2.82 10*3/uL (ref 1.10–6.33)
Absolute nRBC: 0 10*3/uL (ref <=0.00)
Basophils %: 1 %
Eosinophils %: 4.9 %
Hematocrit: 31 % — ABNORMAL LOW (ref 34.7–43.7)
Hemoglobin: 10.3 g/dL — ABNORMAL LOW (ref 11.4–14.8)
Immature Granulocytes %: 0.2 %
Lymphocytes %: 36.9 %
MCH: 28.8 pg (ref 25.1–33.5)
MCHC: 33.2 g/dL (ref 31.5–35.8)
MCV: 86.6 fL (ref 78.0–96.0)
MPV: 11 fL (ref 8.9–12.5)
Monocytes %: 10.5 %
Neutrophils %: 46.5 %
Platelet Count: 259 10*3/uL (ref 142–346)
Preliminary Absolute Neutrophil Count: 2.82 10*3/uL (ref 1.10–6.33)
RBC: 3.58 10*6/uL — ABNORMAL LOW (ref 3.90–5.10)
RDW: 14 % (ref 11–15)
WBC: 6.07 10*3/uL (ref 3.10–9.50)
nRBC %: 0 /100 WBC (ref <=0.0)

## 2023-05-05 LAB — COMPREHENSIVE METABOLIC PANEL
ALT: 8 U/L (ref 0–55)
AST (SGOT): 17 U/L (ref 5–41)
Albumin/Globulin Ratio: 1.3 (ref 0.9–2.2)
Albumin: 3.2 g/dL — ABNORMAL LOW (ref 3.5–5.0)
Alkaline Phosphatase: 52 U/L (ref 37–117)
Anion Gap: 8 (ref 5.0–15.0)
BUN: 10 mg/dL (ref 7–21)
Bilirubin, Total: 0.4 mg/dL (ref 0.2–1.2)
CO2: 26 mEq/L (ref 17–29)
Calcium: 8.6 mg/dL (ref 7.9–10.2)
Chloride: 100 mEq/L (ref 99–111)
Creatinine: 0.6 mg/dL (ref 0.4–1.0)
GFR: >60 mL/min/{1.73_m2} (ref >=60.0)
Globulin: 2.5 g/dL (ref 2.0–3.6)
Glucose: 89 mg/dL (ref 70–100)
Potassium: 3.8 mEq/L (ref 3.5–5.3)
Protein, Total: 5.7 g/dL — ABNORMAL LOW (ref 6.0–8.3)
Sodium: 134 mEq/L — ABNORMAL LOW (ref 135–145)

## 2023-05-05 MED ORDER — EMPAGLIFLOZIN 10 MG PO TABS
10.0000 mg | ORAL_TABLET | Freq: Every day | ORAL | Status: DC
Start: 2023-05-06 — End: 2023-05-08
  Administered 2023-05-06 – 2023-05-08 (×2): 10 mg via ORAL
  Filled 2023-05-05 (×2): qty 1

## 2023-05-05 MED ORDER — CYANOCOBALAMIN 1000 MCG PO TABS
1000.0000 ug | ORAL_TABLET | Freq: Every day | ORAL | Status: DC
Start: 2023-05-05 — End: 2023-05-08

## 2023-05-05 MED ORDER — CYANOCOBALAMIN 1000 MCG/ML IJ SOLN
1000.0000 ug | Freq: Every day | INTRAMUSCULAR | Status: DC
Start: 2023-05-05 — End: 2023-05-08
  Administered 2023-05-05 – 2023-05-08 (×4): 1000 ug via INTRAMUSCULAR
  Filled 2023-05-05 (×4): qty 1

## 2023-05-05 MED ORDER — FUROSEMIDE 10 MG/ML IJ SOLN
40.0000 mg | Freq: Every day | INTRAMUSCULAR | Status: DC
Start: 2023-05-05 — End: 2023-05-06
  Administered 2023-05-05 – 2023-05-06 (×2): 40 mg via INTRAVENOUS
  Filled 2023-05-05 (×2): qty 4

## 2023-05-05 MED ORDER — SACUBITRIL-VALSARTAN 49-51 MG PO TABS
1.0000 | ORAL_TABLET | Freq: Two times a day (BID) | ORAL | Status: DC
Start: 2023-05-05 — End: 2023-05-06
  Administered 2023-05-05 – 2023-05-06 (×3): 1 via ORAL
  Filled 2023-05-05 (×4): qty 1

## 2023-05-05 MED ORDER — ACETAMINOPHEN 325 MG PO TABS
325.0000 mg | ORAL_TABLET | ORAL | Status: DC | PRN
Start: 2023-05-05 — End: 2023-05-08
  Administered 2023-05-05: 325 mg via ORAL
  Filled 2023-05-05: qty 1

## 2023-05-05 NOTE — Plan of Care (Signed)
Problem: Inadequate Gas Exchange  Goal: Adequate oxygenation and improved ventilation  Outcome: Progressing  Flowsheets (Taken 05/05/2023 2012)  Adequate oxygenation and improved ventilation:   Assess lung sounds   Monitor SpO2 and treat as needed   Provide mechanical and oxygen support to facilitate gas exchange   Position for maximum ventilatory efficiency   Teach/reinforce use of incentive spirometer 10 times per hour while awake, cough and deep breath as needed   Plan activities to conserve energy: plan rest periods   Increase activity as tolerated/progressive mobility     Problem: Hemodynamic Status: Cardiac  Goal: Stable vital signs and fluid balance  Outcome: Progressing  Flowsheets (Taken 05/05/2023 2012)  Stable vital signs and fluid balance:   Assess signs and symptoms associated with cardiac rhythm changes   Monitor lab values     Problem: Moderate/High Fall Risk Score >5  Goal: Patient will remain free of falls  Outcome: Progressing  Flowsheets (Taken 05/05/2023 2000)  Moderate Risk (6-13):   LOW-Fall Interventions Appropriate for Low Fall Risk   LOW-Anticoagulation education for injury risk   MOD-Consider activation of bed alarm if appropriate   MOD-Remain with patient during toileting   MOD-Place bedside commode and assistive devices out of sight when not in use   MOD-Perform dangle, stand, walk (DSW) prior to mobilization   MOD-Request PT/OT consult order for patients with gait/mobility impairment   MOD-Use gait belt when appropriate   MOD-include family in multidisciplinary POC discussions

## 2023-05-05 NOTE — Plan of Care (Signed)
Problem: Pain interferes with ability to perform ADL  Goal: Pain at adequate level as identified by patient  Flowsheets (Taken 05/05/2023 1703)  Pain at adequate level as identified by patient:   Identify patient comfort function goal   Assess for risk of opioid induced respiratory depression, including snoring/sleep apnea. Alert healthcare team of risk factors identified.   Assess pain on admission, during daily assessment and/or before any "as needed" intervention(s)   Reassess pain within 30-60 minutes of any procedure/intervention, per Pain Assessment, Intervention, Reassessment (AIR) Cycle   Evaluate if patient comfort function goal is met   Evaluate patient's satisfaction with pain management progress     Problem: Side Effects from Pain Analgesia  Goal: Patient will experience minimal side effects of analgesic therapy  Flowsheets (Taken 05/05/2023 1703)  Patient will experience minimal side effects of analgesic therapy:   Monitor/assess patient's respiratory status (RR depth, effort, breath sounds)   Assess for changes in cognitive function     Problem: Safety  Goal: Patient will be free from injury during hospitalization  Flowsheets (Taken 05/05/2023 1703)  Patient will be free from injury during hospitalization:   Assess patient's risk for falls and implement fall prevention plan of care per policy   Use appropriate transfer methods   Provide and maintain safe environment   Include patient/ family/ care giver in decisions related to safety   Ensure appropriate safety devices are available at the bedside   Hourly rounding  Goal: Patient will be free from infection during hospitalization  Flowsheets (Taken 05/05/2023 1703)  Free from Infection during hospitalization:   Assess and monitor for signs and symptoms of infection   Monitor lab/diagnostic results     Problem: Inadequate Gas Exchange  Goal: Adequate oxygenation and improved ventilation  Flowsheets (Taken 05/05/2023 1703)  Adequate oxygenation and improved  ventilation:   Assess lung sounds   Monitor SpO2 and treat as needed     Problem: Hemodynamic Status: Cardiac  Goal: Stable vital signs and fluid balance  Flowsheets (Taken 05/05/2023 1703)  Stable vital signs and fluid balance:   Assess signs and symptoms associated with cardiac rhythm changes   Monitor lab values     Problem: Moderate/High Fall Risk Score >5  Goal: Patient will remain free of falls  Flowsheets (Taken 05/05/2023 1100)  High (Greater than 13):   MOD-Perform dangle, stand, walk (DSW) prior to mobilization   MOD-Remain with patient during toileting   MOD-Use of chair-pad alarm when appropriate

## 2023-05-05 NOTE — Discharge Instr - AVS First Page (Addendum)
Reason for your Hospital Admission:  Sob,chest pain, chf      Instructions for after your discharge:  Low salt diet ,   Fluid restriction 1578ml/day    Bmp level 5 days

## 2023-05-05 NOTE — Progress Notes (Signed)
[facility] PROGRESS NOTE     Date Time:    05/05/23    7:30 AM  Patient Name: Becky Burton    LOS: 3 days     Subjective:  Comfortable in chair at bedside without shortness of breath. Has not been walking much. Reports orthopnea last evening when trying to go to sleep.     UOP 1.4 L with net negative 1.2 L in 24 hours   Weight is only down 2 lbs since admission   Na 134       Scheduled Meds:   atorvastatin, 10 mg, Oral, Daily  bisoprolol, 10 mg, Oral, Daily  doxazosin, 2 mg, Oral, QHS  famotidine, 40 mg, Oral, Daily  furosemide, 20 mg, Intravenous, Daily  heparin (porcine), 5,000 Units, Subcutaneous, Q12H SCH  hydrALAZINE, 50 mg, Oral, Q8H SCH  levothyroxine, 112 mcg, Oral, Daily at 0600  pantoprazole, 40 mg, Oral, QAM AC  timolol, 1 drop, Both Eyes, Daily  valsartan, 320 mg, Oral, Daily     Continuous Infusions:   PRN Meds: hydrALAZINE    Objective:  Physical Exam:  BP 192/69   Pulse 71   Temp 98.1 F (36.7 C)   Resp 19   Ht 1.499 m (4\' 11" )   Wt 76.1 kg (167 lb 12.8 oz)   SpO2 92%   BMI 33.89 kg/m      Intake/Output Summary (Last 24 hours) at 05/05/2023 0700  Last data filed at 05/04/2023 1828  Gross per 24 hour   Intake 200 ml   Output 1400 ml   Net -1200 ml     Wt Readings from Last 1 Encounters:   05/05/23 0637 76.1 kg (167 lb 12.8 oz)   05/04/23 0620 76 kg (167 lb 9.6 oz)   05/03/23 0638 76.5 kg (168 lb 11.2 oz)   05/02/23 1912 76.4 kg (168 lb 8 oz)   05/02/23 1511 77 kg (169 lb 12.1 oz)     General: Awake, alert, oriented x 3, no acute distress.  Overweight female.  HEENT:  NCAT EOMI  NECK:  No obvious JVD.  No bruits.  Cardiovascular: Regular rate and rhythm, no murmurs, rubs or gallops     Lungs: Bibasilar crackles half way up her back. Slightly diminished breath sounds L base.  Labored when walking to and from bathroom.  Able to complete sentences without supplemental oxygen.   Abdomen: Soft, non-tender  Extremities: Trace nonpitting edema.  2+ Pulses X4.  NEURO:  Mentating well.   No focal deficits.    Lab Review:   Recent Labs     05/05/23  0421 05/04/23  0441 05/03/23  0434   WBC 6.07 6.55 5.22   Hemoglobin 10.3* 10.1* 9.8*   Hematocrit 31.0* 30.2* 29.9*   Platelet Count 259 245 245     Recent Labs     05/02/23  1540   hs Troponin 4.1    Recent Labs     05/05/23  0422 05/04/23  0441 05/03/23  0434   Sodium 134* 135 135   Potassium 3.8 3.6 4.0   CO2 26 25 25    BUN 10 8 7    Creatinine 0.6 0.6 0.7   Glucose 89 90 85    Estimated Creatinine Clearance: 69.9 mL/min (based on SCr of 0.6 mg/dL).   Recent Labs     05/04/23  0441   TSH 0.93   Cholesterol 146   Triglycerides 57   HDL 70   LDL Calculated 65  Telemetry: SR 60-70 with PVC. Intermittent bradycardia 48-52 BPM     Imaging:  Radiology Results (24 Hour)       ** No results found for the last 24 hours. **          TTE 05/03/23  * Left ventricular systolic function is normal with an estimated EF 60-65%. Diastolic dysfunction exists.  * The left atrium is moderately dilated.  * Moderate pulmonary hypertension with estimated right ventricular systolic pressure of 51 mm Hg.    Assessment/Plan:   Becky Burton is a 77 y.o. female with a history of HTN, HLD, GERD, and hypothyroidism who presented to the hospital on 05/02/2023 with chest discomfort and DOE.   Patient reports that she originally went to [facility] for a rash.  She was found to be hypertensive with blood pressures in the 200s systolic.  She was then referred to [facility] for further evaluation.   She reports that she experienced left-sided chest heaviness, and difficulty breathing recently. She noticed she had occasional wheezing that she did not have previously.  She reports she has had difficulty sleeping due to her breathing, and was unable to lie comfortably. Patient reports she sleeps on 2 pillows at night.     In the ED, her NT-ProBNP was elevated 2,322. Chest CT revealed small bilateral pleural effusions and atelectasis. CT was negative for P.E.     Chest  discomfort/orthopnea: with elevated NT-proBNP, chest CT with small bilateral pleural effusions, and evidence of fluid overload on exam. 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 Unfortunately, she is still volume overloaded and unable to lay flat due to orthopnea.   Cancel ST for today and reschedule for tomorrow at 1 PM. NPO after midnight   Increase IV Lasix to 40 mg this morning with goal net negative 1.5 L in 24 hours   Consider adding SGLT2-I tomorrow for GDMT after stress test   Monitor electrolytes and renal function. Replete for K >4 and Mg >2.  I's and O's; daily weights.  BP control with goal <140 mmHg.  Continue atorvastatin  Stop bisoprolol 10 mg daily.   Continue Cardura 2 mg daily   Continue hydralazine 50 mg TID  Continue valsartan 320 mg daily. Consider transitioning to [facility]  Telemetry monitoring   Ambulate as tolerated to assess dyspnea     I saw and examined the patient and spent 15 minutes providing care on the day of service including time spent in chart and data review, documentation, ordering, speaking with the patient and family, and communicating with the care team.    Signed by: Londell Moh, PA    Pt with orthopnea last night and significantly volume overload noted on lung exam (though interestingly no JVD when sitting up 90 degrees).  Will give Lasix 40mg  IV today.  OK to feed patient.   Nuclear chemical stress testing tomorrow.  Stop Bisoprolol as HR too low for this.  Switch Valsartan 320mg  daily to Entresto 49-51 mg BID (Diastolic HF).  Start Jardiance 10mg   daily tomorrow.  Continue Hydralazine 50mg  TID.  Continue Cardura 2mg  daily.  I saw and evaluated the patient and spent 35 minutes providing care on the day of service.  I performed the substantive portion of this visit by providing more than 50% of the total time.  I have edited the note above to reflect my findings.    Mal Amabile, MD, [facility]  [facility]  [address]  [place], [place]  [address]  Fax [phone]  Phone [phone]    [facility] Cardiology   [facility] NP Spectralink [phone] or [phone] (M-F 8 am-5 pm)   Tyson Babinski [facility] Spectralink [phone]  (M-F 8 am-5 pm)  After Hours On-Call Physician: [phone]

## 2023-05-05 NOTE — H&P (Addendum)
PROGRESS NOTE     Date Time: 05/03/23 12:10 PM  Patient Name: Becky Burton, Becky Burton         Patient Active Problem List   Diagnosis    Dyspnea                  Assessment/ Plan:   Moniquea Stiteler is a 77 y.o. female with  Acute congestive heart failure. Secondary to Grade  III diastolic dysfunction (restrictive pattern) with elevated left atrial pressure   Cont Lasix 20 mg IV daily.   Fluid restriction to 1500 cc/day.  Low-salt diet.,stress test today  Hypertension stable continue her NovoLog 10 mg once a day, Cardura 2 mg once a day ,Increase hydralazine 50 mg every 8 as valsartan 320 mg once a day     Hypothyroidism continue Synthroid 112 mcg once a day     Hypercholesterolemia continue Lipitor 10 mg once a day  Gastroesophageal reflux disease continue her Protonix and famotidine  Mild hyponatremia most likely due to fluid overload will repeat labs in the morning.  Thrombocytopenia     Anemia, work up ordered   Obesity   DVT prophylaxis  SCD/TEDS bilateral LE             All labs/ notes from last 24 hrs reviewed  Will continue rest of the plan same  Care Plan  D/W nursing,consultants,case manager     This note was generated by the Epic EMR system/ Dragon speech recognition and may contain inherent errors or omissions not intended by the user. Grammatical errors, random word insertions, deletions, pronoun errors and incomplete sentences are occasional consequences of this technology due to software limitations. Not all errors are caught or corrected. If there are questions or concerns about the content of this note or information contained within the body of this dictation they should be addressed directly with the author for clarification        Subjective:   Patient Seen and Examined. The notes from the last 24 hours were reviewed   pt still feels SOB, cannot lay flat feels pressure in chest when lays down     Medications:             Current Facility-Administered Medications   Medication Dose Route  Frequency    atorvastatin  10 mg Oral Daily    bisoprolol  10 mg Oral Daily    doxazosin  2 mg Oral QHS    famotidine  40 mg Oral Daily    furosemide  20 mg Intravenous Daily    heparin (porcine)  5,000 Units Subcutaneous Q12H [facility]    levothyroxine  112 mcg Oral Daily at 0600    pantoprazole  40 mg Oral QAM AC    timolol  1 drop Both Eyes Daily    valsartan  320 mg Oral Daily                  Review of Systems:      A comprehensive review of systems was: obtained from nurse ,chart review and the patient   General ROS: negative for - fatigue , lightheadedness.or dizziness blurry vision  ENT ROS: negative for - headaches, nasal congestion or tinnitus, no sore throat, no ear ache   Gastrointestinal ROS: no abdominal pain,   no nausea vomiting  Neurological ROS: negative for - , headaches,  numbness/tingling   GU,nofrequency , urgency ,or burning micturation.  Physical Exam:      Patient Vitals for the past 24 hrs:    BP Temp Temp src Pulse Resp SpO2 Height Weight   05/03/23 1130 150/67 98.1 F (36.7 C) Oral (!) 55 20 95 % -- --   05/03/23 0751 198/68 98.4 F (36.9 C) Oral (!) 56 18 95 % -- --   05/03/23 0638 -- -- -- -- -- -- -- 76.5 kg (168 lb 11.2 oz)   05/03/23 0347 172/75 98.2 F (36.8 C) Oral (!) 52 17 91 % -- --   05/03/23 0013 142/60 98.1 F (36.7 C) Oral (!) 50 18 94 % -- --   05/02/23 2127 180/69 -- -- (!) 57 -- -- -- --   05/02/23 2126 192/63 -- -- 60 -- -- -- --   05/02/23 1942 186/71 98.2 F (36.8 C) Oral (!) 59 18 94 % -- --   05/02/23 1912 -- -- -- -- -- -- -- 76.4 kg (168 lb 8 oz)   05/02/23 1835 -- -- -- -- -- -- 1.499 m (4\' 11" ) --   05/02/23 1825 187/74 97.5 F (36.4 C) Oral 62 -- 94 % -- --   05/02/23 1730 171/71 -- -- 60 18 95 % -- --   05/02/23 1700 161/79 98.6 F (37 C) Oral 60 20 96 % -- --   05/02/23 1511 170/78 98 F (36.7 C) Oral 92 20 98 % -- 77 kg (169 lb 12.1 oz)      Intake and Output Summary (Last 24 hours) at Date Time     Intake/Output Summary (Last 24 hours) at  05/03/2023 1210  Last data filed at 05/03/2023 1610      Gross per 24 hour   Intake --   Output 100 ml   Net -100 ml         General Appearance-Well appearing, no acute distress  HEENT- PERRLA, EOMI, Sclera clear. Oropharynx -normal   Neck-Supple, no masses or abnormal adenopathy. No JVD or bruits.  Lungs- Bilaterally symmetrical equal in expansion on auscultation bilateral  basal crackles   CV- RRR, normal S1, S2, no rubs/murmurs/gallops1 plus edemas  Abdomen-Not distended, soft non tender. +BS, No masses palpated. No HSM  Neuro -Alert oriented no focal motor sensory deficit  Muscloskeletal-No cyanosis, clubbing, or edema, periopheral pulsus positive  no joint tenderness, deformity or swelling  Skin no lesion warm and dry   Psych mood and affects are appropriate     Labs:           Recent Labs   Lab 05/03/23  0434 05/02/23  1540   WBC 5.22 6.16   RBC 3.39* 3.74*   Hemoglobin 9.8* 10.8*   Hematocrit 29.9* 32.5*   MCV 88.2 86.9   Platelet Count 245 115*              Recent Labs   Lab 05/03/23  0434 05/02/23  1540   Sodium 135 134*   Potassium 4.0 3.9   Chloride 101 99   CO2 25 27   BUN 7 8   Creatinine 0.7 0.7   Glucose 85 108*   Calcium 8.5 9.0                Invalid input(s): "OSMOL"         Recent Labs   Lab 05/02/23  1540   ALT 9   AST (SGOT) 14   Bilirubin, Total 0.5   Albumin 3.4*   Alkaline Phosphatase 53  Recent Labs   Lab 05/02/23  1540   hs Troponin 4.1             Microbiology, reviewed and are significant for:  Microbiology Results (last 15 days)         Procedure Component Value Units Date/Time     COVID-19 and Influenza ([place]) (symptomatic) [[id number]]  (Normal) Collected: 05/02/23 1542     Order Status: Completed Specimen: Swab from Anterior Nares Updated: 05/02/23 1645       SARS-CoV-2 (COVID-19) RNA Not Detected       Influenza A RNA Not Detected       Influenza B RNA Not Detected     Narrative:       A result of "Detected" indicates POSITIVE for the presence of viral RNA  A result of "Not  Detected" indicates NEGATIVE for the presence of viral RNA     Test performed using the Roche cobas [place] SARS-CoV-2 & Influenza A/B assay. This is a multiplex real-time RT-PCR assay for the detection of SARS-CoV-2, influenza A, and influenza B virus RNA. Viral nucleic acids may persist in vivo, independent of viability. Detection of viral nucleic acid does not imply the presence of infectious virus, or that virus nucleic acid is the cause of clinical symptoms. Negative results do not preclude SARS-CoV-2, influenza A, and/or influenza B infection and should not be used as the sole basis for diagnosis, treatment or other patient management decisions. Invalid results may be due to inhibiting substances in the specimen and recollection should occur.                         No results for input(s): "CXBLD" in the last 24 hours.  Invalid input(s): "CXURN"     Recent BLOOD CULTURE No results for input(s): "CXBLD" in the last 24 hours.  Recent URINE CULTURE Invalid input(s): "CXURN"     Cardiac Enzymes       Recent Labs   Lab 05/02/23  1540   hs Troponin 4.1         No results found for: "BNP"         Thyroid Studies              No results for input(s): "CXBLD" in the last 24 hours.  Invalid input(s): "CXURN"     Urinalysis          Invalid input(s): "LEUKOCYTESUR"               MICROBIOLOGY  No results for input(s): "CXBLD" in the last 24 hours.  Invalid input(s): "CXURN"              Rads:   Radiological Procedure reviewed.  Radiology Results (24 Hour)         Procedure Component Value Units Date/Time     CT Angio Chest (PE study) [[id number]] Collected: 05/02/23 1704     Order Status: Completed Updated: 05/02/23 1709     Narrative:       HISTORY: Chest pain     COMPARISON: None.     TECHNIQUE:  CT angiogram of the chest performed with intravenous contrast.  Multiplanar reformatted and 3D maximum intensity projection (MIP) images  were created and reviewed. The following dose reduction techniques were  utilized: automated  exposure control and/or adjustment of the mA and/or KV  according to patient size, and the use of an iterative reconstruction  technique.     CONTRAST: iohexol (OMNIPAQUE) 350 MG/ML  injection 80 mL     FINDINGS:  No detectable pulmonary embolism. 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 Coronary artery calcification.  Aortic valvular calcification. Biatrial dilatation.     Scattered mediastinal and hilar lymph nodes are subcentimeter in short axis  and likely reactive. Small bilateral pleural effusions and atelectasis.  Scarring within the lung bases. The lungs are otherwise clear. No  suspicious pulmonary nodule.     Peripherally calcified lesion within the spleen is statistically benign.  Degenerative change thoracic spine.        Impression:          1. No detectable pulmonary embolism.  2. Small bilateral pleural effusions and atelectasis.     Launa Flight, MD  05/02/2023 5:07 PM     Chest AP Portable [[id number]] Collected: 05/02/23 1603     Order Status: Completed Updated: 05/02/23 1607     Narrative:       HISTORY: Shortness of breath      COMPARISON: 03/28/2023     FINDINGS:   Small left pleural effusion. No new consolidation. No pneumothorax.  Unchanged mediastinal silhouette. No acute osseous abnormality.  Redemonstrated round calcification projecting over the left upper quadrant.           Impression:          Small left pleural effusion.        August Albino  05/02/2023 4:05 PM                Signed by: Lurline Idol, MD

## 2023-05-06 DIAGNOSIS — R42 Dizziness and giddiness: Secondary | ICD-10-CM

## 2023-05-06 LAB — LAB USE ONLY - CBC WITH DIFFERENTIAL
Absolute Basophils: 0.05 10*3/uL (ref 0.00–0.08)
Absolute Eosinophils: 0.26 10*3/uL (ref 0.00–0.44)
Absolute Immature Granulocytes: 0.02 10*3/uL (ref 0.00–0.07)
Absolute Lymphocytes: 2.04 10*3/uL (ref 0.42–3.22)
Absolute Monocytes: 0.61 10*3/uL (ref 0.21–0.85)
Absolute Neutrophils: 2.9 10*3/uL (ref 1.10–6.33)
Absolute nRBC: 0 10*3/uL (ref <=0.00)
Basophils %: 0.9 %
Eosinophils %: 4.4 %
Hematocrit: 34.9 % (ref 34.7–43.7)
Hemoglobin: 11.6 g/dL (ref 11.4–14.8)
Immature Granulocytes %: 0.3 %
Lymphocytes %: 34.7 %
MCH: 28.4 pg (ref 25.1–33.5)
MCHC: 33.2 g/dL (ref 31.5–35.8)
MCV: 85.5 fL (ref 78.0–96.0)
MPV: 10.5 fL (ref 8.9–12.5)
Monocytes %: 10.4 %
Neutrophils %: 49.3 %
Platelet Count: 303 10*3/uL (ref 142–346)
Preliminary Absolute Neutrophil Count: 2.9 10*3/uL (ref 1.10–6.33)
RBC: 4.08 10*6/uL (ref 3.90–5.10)
RDW: 14 % (ref 11–15)
WBC: 5.88 10*3/uL (ref 3.10–9.50)
nRBC %: 0 /100 WBC (ref <=0.0)

## 2023-05-06 LAB — MAGNESIUM: Magnesium: 1.8 mg/dL (ref 1.6–2.6)

## 2023-05-06 LAB — COMPREHENSIVE METABOLIC PANEL
ALT: 9 U/L (ref 0–55)
AST (SGOT): 15 U/L (ref 5–41)
Albumin/Globulin Ratio: 1.3 (ref 0.9–2.2)
Albumin: 3.4 g/dL — ABNORMAL LOW (ref 3.5–5.0)
Alkaline Phosphatase: 58 U/L (ref 37–117)
Anion Gap: 9 (ref 5.0–15.0)
BUN: 8 mg/dL (ref 7–21)
Bilirubin, Total: 0.4 mg/dL (ref 0.2–1.2)
CO2: 25 mEq/L (ref 17–29)
Calcium: 8.9 mg/dL (ref 7.9–10.2)
Chloride: 100 mEq/L (ref 99–111)
Creatinine: 0.7 mg/dL (ref 0.4–1.0)
GFR: >60 mL/min/{1.73_m2} (ref >=60.0)
Globulin: 2.7 g/dL (ref 2.0–3.6)
Glucose: 99 mg/dL (ref 70–100)
Potassium: 3.3 mEq/L — ABNORMAL LOW (ref 3.5–5.3)
Protein, Total: 6.1 g/dL (ref 6.0–8.3)
Sodium: 134 mEq/L — ABNORMAL LOW (ref 135–145)

## 2023-05-06 LAB — BASIC METABOLIC PANEL
Anion Gap: 11 (ref 5.0–15.0)
BUN: 12 mg/dL (ref 7–21)
CO2: 23 mEq/L (ref 17–29)
Calcium: 9 mg/dL (ref 7.9–10.2)
Chloride: 101 mEq/L (ref 99–111)
Creatinine: 0.8 mg/dL (ref 0.4–1.0)
GFR: >60 mL/min/{1.73_m2} (ref >=60.0)
Glucose: 100 mg/dL (ref 70–100)
Potassium: 3.6 mEq/L (ref 3.5–5.3)
Sodium: 135 mEq/L (ref 135–145)

## 2023-05-06 LAB — HIGH SENSITIVITY TROPONIN-I: hs Troponin: 7.3 ng/L (ref <=14.0)

## 2023-05-06 LAB — WHOLE BLOOD GLUCOSE POCT: Whole Blood Glucose POCT: 96 mg/dL (ref 70–100)

## 2023-05-06 MED ORDER — ONDANSETRON HCL 4 MG/2ML IJ SOLN
4.0000 mg | Freq: Once | INTRAMUSCULAR | Status: DC
Start: 2023-05-06 — End: 2023-05-08

## 2023-05-06 MED ORDER — ONDANSETRON HCL 4 MG/2ML IJ SOLN
4.0000 mg | Freq: Four times a day (QID) | INTRAMUSCULAR | Status: DC | PRN
Start: 2023-05-06 — End: 2023-05-08
  Administered 2023-05-06: 4 mg via INTRAVENOUS
  Filled 2023-05-06: qty 2

## 2023-05-06 MED ORDER — POTASSIUM CHLORIDE CRYS ER 20 MEQ PO TBCR
40.0000 meq | EXTENDED_RELEASE_TABLET | Freq: Once | ORAL | Status: AC
Start: 2023-05-06 — End: 2023-05-06
  Administered 2023-05-06: 40 meq via ORAL
  Filled 2023-05-06: qty 2

## 2023-05-06 MED ORDER — SODIUM CHLORIDE 0.9 % IV BOLUS
250.0000 mL | Freq: Once | INTRAVENOUS | Status: AC
Start: 2023-05-07 — End: 2023-05-07
  Administered 2023-05-06: 250 mL via INTRAVENOUS

## 2023-05-06 MED ORDER — SODIUM CHLORIDE 0.9 % IV BOLUS
250.0000 mL | Freq: Once | INTRAVENOUS | Status: AC
Start: 2023-05-06 — End: 2023-05-07
  Administered 2023-05-06: 250 mL via INTRAVENOUS

## 2023-05-06 MED ORDER — MELATONIN 3 MG PO TABS
3.0000 mg | ORAL_TABLET | Freq: Once | ORAL | Status: DC
Start: 2023-05-06 — End: 2023-05-08

## 2023-05-06 MED ORDER — SODIUM CHLORIDE 0.9 % IV BOLUS
250.0000 mL | Freq: Once | INTRAVENOUS | Status: DC
Start: 2023-05-06 — End: 2023-05-06

## 2023-05-06 NOTE — Progress Notes (Signed)
[facility] CARDIOLOGY PROGRESS NOTE     Date Time:    05/06/23    9:04 AM  Patient Name: Becky Burton    LOS: 4 days     Subjective:  Able to lay flat the whole night last night comfortably. No chest pain or shortness of breath. In the bathroom freshening up during my visit. No dyspnea when walking to and from the bathroom.     UOP 2.2 L with net negative 1.2 L in 24 hours   Weight is down 3 lbs   Na 134   K 3.3    Scheduled Meds:   atorvastatin, 10 mg, Oral, Daily  doxazosin, 2 mg, Oral, QHS  empagliflozin, 10 mg, Oral, Daily  famotidine, 40 mg, Oral, Daily  furosemide, 40 mg, Intravenous, Daily  heparin (porcine), 5,000 Units, Subcutaneous, Q12H SCH  hydrALAZINE, 50 mg, Oral, Q8H SCH  levothyroxine, 112 mcg, Oral, Daily at 0600  pantoprazole, 40 mg, Oral, QAM AC  sacubitril-valsartan, 1 tablet, Oral, Q12H SCH  timolol, 1 drop, Both Eyes, Daily  vitamin B-12, 1,000 mcg, Intramuscular, Daily     Continuous Infusions:   PRN Meds: acetaminophen, hydrALAZINE    Objective:  Physical Exam:  BP 162/61   Pulse 63   Temp 98.2 F (36.8 C) (Oral)   Resp 16   Ht 1.499 m (4\' 11" )   Wt 74.5 kg (164 lb 4.8 oz)   SpO2 94%   BMI 33.18 kg/m      Intake/Output Summary (Last 24 hours) at 05/06/2023 0700  Last data filed at 05/06/2023 0259  Gross per 24 hour   Intake 942 ml   Output 2190 ml   Net -1248 ml     Wt Readings from Last 1 Encounters:   05/06/23 0630 74.5 kg (164 lb 4.8 oz)   05/05/23 0637 76.1 kg (167 lb 12.8 oz)   05/04/23 0620 76 kg (167 lb 9.6 oz)   05/03/23 0638 76.5 kg (168 lb 11.2 oz)   05/02/23 1912 76.4 kg (168 lb 8 oz)   05/02/23 1511 77 kg (169 lb 12.1 oz)     General: Awake, alert, oriented x 3, no acute distress.  Overweight female.  HEENT:  NCAT EOMI  NECK:  No obvious JVD.  No bruits.  Cardiovascular: Regular rate and rhythm, no murmurs, rubs or gallops     Lungs: Slight insp rales at the left base.  Unlabored on RA laying flat in bed.  Abdomen: Soft, non-tender  Extremities: Trace nonpitting  edema.  2+ Pulses X4.  NEURO:  Mentating well.  No focal deficits.    Lab Review:   Recent Labs     05/06/23  0359 05/05/23  0421 05/04/23  0441   WBC 5.88 6.07 6.55   Hemoglobin 11.6 10.3* 10.1*   Hematocrit 34.9 31.0* 30.2*   Platelet Count 303 259 245     No results for input(s): "TROPI", "ISTATTROPONI", "CK", "CKMB", "BNP", "DDIMER" in the last 72 hours. Recent Labs     05/06/23  0359 05/05/23  0422 05/04/23  0441   Sodium 134* 134* 135   Potassium 3.3* 3.8 3.6   CO2 25 26 25    BUN 8 10 8    Creatinine 0.7 0.6 0.6   Glucose 99 89 90    Estimated Creatinine Clearance: 59.2 mL/min (based on SCr of 0.7 mg/dL).   Recent Labs     05/04/23  0441   TSH 0.93   Cholesterol 146   Triglycerides 57  HDL 70   LDL Calculated 65        Telemetry: SR/SB 55-65 BPM. HR up to 80 BPM while in the bathroom freshening up. Intermittent bradycardia as slow as 44 BPM. Brief ventricular bigeminy      Imaging:  Radiology Results (24 Hour)       ** No results found for the last 24 hours. **          TTE 05/03/23  * Left ventricular systolic function is normal with an estimated EF of 60-65%. Diastolic dysfunction exists.  * The left atrium is moderately dilated.  * Moderate pulmonary hypertension with estimated RV systolic pressure 51 mm Hg    Assessment/Plan:   Becky Burton is a 77 y.o. female with a history of HTN, HLD, GERD, and hypothyroidism who presented to the hospital on 05/02/2023 with chest discomfort and DOE.   Patient reports that she originally went to [facility] for a rash.  She was found to be hypertensive with blood pressures in the 200s systolic.  She was then referred to [facility] for further evaluation.   She reports that she experienced left-sided chest heaviness, and difficulty breathing recently. She noticed she had occasional wheezing that she did not have previously.  She reports she has had difficulty sleeping due to her breathing, and was unable to lie comfortably. Patient reports she sleeps on 2  pillows at night.     In the ED, her NT-ProBNP was elevated 2,322. Chest CT revealed small bilateral pleural effusions and atelectasis. CT was negative for P.E.      Chest discomfort/orthopnea: elevated NT-proBNP, chest CT with small bilateral pleural effusions. 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  Received IV Lasix 40 mg this morning. Transition to PO Lasix 40 mg daily starting tomorrow   Replace K with goal >4   Increase Entresto to 97-103 mg BID for improved hypertensive control and GDMT   Continue Jardiance 10 mg daily  Continue Cardura 2 mg daily   Continue hydralazine 50 mg TID  Continue atorvastatin  Telemetry monitoring   BP control with goal <140 mmHg    I saw and examined the patient and spent 15 minutes providing care on the day of service including time spent in chart and data review, documentation, ordering, speaking with the patient and family, and communicating with the care team.    Signed by: Londell Moh, PA    Pt BP is better today after morning meds 144/73 mm Hg at 11:30 AM.  Continue meds as above.  BMP daily.  OK to feed patient as her Stress test must be moved to tomorrow (Nuclear camera maintenance took longer than expected).  NPO p MN for Nuclear stress test tomorrow AM.  Confirmed plan with Nuclear Tech Nash Dimmer.  Confirmed with patient she is no longer experiencing shortness of breath nor chest pressure when laying flat.    Following with you.    I saw and evaluated  the  patient and spent 35 minutes providing care on the day of service.  I performed the substantive portion of this visit by providing more than 50% of the total time.  I have edited the note above to reflect my findings. This also involved discussing the situation of our Nuclear camera maintenance with Nuclear Tech and other providers and RN staff.    Mal Amabile, MD, [facility]  [facility]  [address]  [place], [place]  [address]  Fax [phone]  Phone [phone]    [facility] Cardiology   [facility] NP Spectralink [phone] or [phone] (M-F 8 am-5 pm)   Tyson Babinski [facility] Spectralink [phone]  (M-F 8 am-5 pm)  After Hours On-Call Physician: [phone]

## 2023-05-06 NOTE — Progress Notes (Signed)
RAPID RESPONSE NOTE     Becky Burton is a  77 y.o.  female visiting [place], hx of HTN, p/w orthopnea and SOB found to have acute diastolic CHF, also had chest discomfort.     The Critical Care / Rapid Response Team was activated on 05/06/2023 around 11 pm for systolic blood pressure less than 90 mmHG.   Has been diuresed aggressively since admit, weight 169--->164, echo normal EF, evidence of diastolic dysfunciton, estimated RVSP  51mm Hg, plan for stress test tomorrow, was starte don Entresto yesterday  Now she is not feeling, well, nauseas, some pain b/l eye areas and her SBP in the 80s, said never has been that low, she has a log of her BP and it is usually elevated sbp 140s-150s      Assessment/plan:  #Acute hypotension suspect due to over diuresis and GDMT , will give IVF bolus , hold entresto, hold diuresis, check EKG, trops, BMP    Vitals/Physical Exam:     Vitals:    05/06/23 2240 05/06/23 2258 05/06/23 2259 05/06/23 2310   BP: 107/57 (!) 85/47 (!) 87/48 117/55   Pulse:       Resp:       Temp:       TempSrc:       SpO2:       Weight:       Height:             GEN APPEARANCE: Normal   HEENT: PERLA; EOMI; Conjunctiva Clear  NECK: Supple; No bruits  CVS: RRR, S1, S2; No edema or JVDs  LUNGS: CTAB; No Wheezes; No Rhonchi: No rales  ABD: Soft; No TTP; + Normoactive BS  EXT: No edema; Pulses 2+ and intact, normal capillary refill  SKIN: No rash or Lesions  NEURO: CN 2-12 intact; No Focal neurological deficits    Updated son at bedside        Signed,  Drue Dun, MD  11:13 PM 05/06/2023

## 2023-05-06 NOTE — Plan of Care (Signed)
Problem: Pain interferes with ability to perform ADL  Goal: Pain at adequate level as identified by patient  Outcome: Progressing     Problem: Side Effects from Pain Analgesia  Goal: Patient will experience minimal side effects of analgesic therapy  Outcome: Progressing     Problem: Safety  Goal: Patient will be free from injury during hospitalization  Outcome: Progressing  Goal: Patient will be free from infection during hospitalization  Outcome: Progressing     Problem: Inadequate Gas Exchange  Goal: Adequate oxygenation and improved ventilation  Outcome: Progressing     Problem: Hemodynamic Status: Cardiac  Goal: Stable vital signs and fluid balance  Outcome: Progressing     Problem: Moderate/High Fall Risk Score >5  Goal: Patient will remain free of falls  Outcome: Progressing     Problem: Compromised Activity/Mobility  Goal: Activity/Mobility Interventions  Outcome: Progressing

## 2023-05-06 NOTE — H&P (Signed)
PROGRESS NOTE     Date Time: 05/03/23 12:10 PM  Patient Name: Becky Burton, Becky Burton         Patient Active Problem List   Diagnosis    Dyspnea                  Assessment/ Plan:   Lunarae Zengel is a 77 y.o. female with  Acute congestive heart failure. Secondary to Grade  III diastolic dysfunction (restrictive pattern) with elevated left atrial pressure   Patient Bystolic was [place]'d due to bradycardia patient on Jardiance 10 mg once a day.  Valsartan was stopped and patient is on Entresto 49/51 1 tablet twice a day.  Patient is off Lasix 40 mg IV daily.  Continue fluid restriction to 1500 cc/day.  Low-salt diet.  Patient scheduled for stress test    Hypokalemia replace potassium    Hypertension stable continue her Cardura 2 mg once a day ,Increase hydralazine 50 mg every 8 as valsartan 320 mg once a day Jardiance 10 mg 1 daily     Hypothyroidism continue Synthroid 112 mcg once a day     Hypercholesterolemia continue Lipitor 10 mg once a day  Gastroesophageal reflux disease continue her Protonix and famotidine  Mild hyponatremia most likely due to fluid overload will repeat labs in the morning.  Thrombocytopenia     Anemia, work up ordered   Obesity   DVT prophylaxis  SCD/TEDS bilateral LE             All labs/ notes from last 24 hrs reviewed  Will continue rest of the plan same  Care Plan  D/W nursing,consultants,case manager     This note was generated by the Epic EMR system/ Dragon speech recognition and may contain inherent errors or omissions not intended by the user. Grammatical errors, random word insertions, deletions, pronoun errors and incomplete sentences are occasional consequences of this technology due to software limitations. Not all errors are caught or corrected. If there are questions or concerns about the content of this note or information contained within the body of this dictation they should be addressed directly with the author for clarification        Subjective:   Patient Seen  and Examined. The notes from the last 24 hours were reviewed   pt still feels better shortness of breath improved she was able to lay down flat at night  Medications:             Current Facility-Administered Medications   Medication Dose Route Frequency    atorvastatin  10 mg Oral Daily    bisoprolol  10 mg Oral Daily    doxazosin  2 mg Oral QHS    famotidine  40 mg Oral Daily    furosemide  20 mg Intravenous Daily    heparin (porcine)  5,000 Units Subcutaneous Q12H [facility]    levothyroxine  112 mcg Oral Daily at 0600    pantoprazole  40 mg Oral QAM AC    timolol  1 drop Both Eyes Daily    valsartan  320 mg Oral Daily                  Review of Systems:      A comprehensive review of systems was: obtained from nurse ,chart review and the patient   General ROS: negative for - fatigue , lightheadedness.or dizziness blurry vision  ENT ROS: negative for - headaches, nasal congestion or tinnitus, no sore throat, no ear ache  Gastrointestinal ROS: no abdominal pain,   no nausea vomiting  Neurological ROS: negative for - , headaches,  numbness/tingling   GU,nofrequency , urgency ,or burning micturation.                    Physical Exam:      Patient Vitals for the past 24 hrs:    BP Temp Temp src Pulse Resp SpO2 Height Weight   05/03/23 1130 150/67 98.1 F (36.7 C) Oral (!) 55 20 95 % -- --   05/03/23 0751 198/68 98.4 F (36.9 C) Oral (!) 56 18 95 % -- --   05/03/23 0638 -- -- -- -- -- -- -- 76.5 kg (168 lb 11.2 oz)   05/03/23 0347 172/75 98.2 F (36.8 C) Oral (!) 52 17 91 % -- --   05/03/23 0013 142/60 98.1 F (36.7 C) Oral (!) 50 18 94 % -- --   05/02/23 2127 180/69 -- -- (!) 57 -- -- -- --   05/02/23 2126 192/63 -- -- 60 -- -- -- --   05/02/23 1942 186/71 98.2 F (36.8 C) Oral (!) 59 18 94 % -- --   05/02/23 1912 -- -- -- -- -- -- -- 76.4 kg (168 lb 8 oz)   05/02/23 1835 -- -- -- -- -- -- 1.499 m (4\' 11" ) --   05/02/23 1825 187/74 97.5 F (36.4 C) Oral 62 -- 94 % -- --   05/02/23 1730 171/71 -- -- 60 18 95 % -- --    05/02/23 1700 161/79 98.6 F (37 C) Oral 60 20 96 % -- --   05/02/23 1511 170/78 98 F (36.7 C) Oral 92 20 98 % -- 77 kg (169 lb 12.1 oz)      Intake and Output Summary (Last 24 hours) at Date Time     Intake/Output Summary (Last 24 hours) at 05/03/2023 1210  Last data filed at 05/03/2023 1610      Gross per 24 hour   Intake --   Output 100 ml   Net -100 ml         General Appearance-Well appearing, no acute distress  HEENT- PERRLA, EOMI, Sclera clear. Oropharynx -normal   Neck-Supple, no masses or abnormal adenopathy. No JVD or bruits.  Lungs- Bilaterally symmetrical equal in expansion on auscultation bilateral  basal crackles   CV- RRR, normal S1, S2, no rubs/murmurs/gallops1 plus edemas  Abdomen-Not distended, soft non tender. +BS, No masses palpated. No HSM  Neuro -Alert oriented no focal motor sensory deficit  Muscloskeletal-No cyanosis, clubbing, or edema, periopheral pulsus positive  no joint tenderness, deformity or swelling  Skin no lesion warm and dry   Psych mood and affects are appropriate     Labs:           Recent Labs   Lab 05/03/23  0434 05/02/23  1540   WBC 5.22 6.16   RBC 3.39* 3.74*   Hemoglobin 9.8* 10.8*   Hematocrit 29.9* 32.5*   MCV 88.2 86.9   Platelet Count 245 115*              Recent Labs   Lab 05/03/23  0434 05/02/23  1540   Sodium 135 134*   Potassium 4.0 3.9   Chloride 101 99   CO2 25 27   BUN 7 8   Creatinine 0.7 0.7   Glucose 85 108*   Calcium 8.5 9.0  Invalid input(s): "OSMOL"         Recent Labs   Lab 05/02/23  1540   ALT 9   AST (SGOT) 14   Bilirubin, Total 0.5   Albumin 3.4*   Alkaline Phosphatase 53             Recent Labs   Lab 05/02/23  1540   hs Troponin 4.1             Microbiology, reviewed and are significant for:  Microbiology Results (last 15 days)         Procedure Component Value Units Date/Time     COVID-19 and Influenza ([facility]) (symptomatic) [[id number]]  (Normal) Collected: 05/02/23 1542     Order Status: Completed Specimen: Swab from Anterior Nares  Updated: 05/02/23 1645       SARS-CoV-2 (COVID-19) RNA Not Detected       Influenza A RNA Not Detected       Influenza B RNA Not Detected     Narrative:       A result of "Detected" indicates POSITIVE for the presence of viral RNA  A result of "Not Detected" indicates NEGATIVE for the presence of viral RNA     Test performed using the Roche cobas [place] SARS-CoV-2 & Influenza A/B assay. This is a multiplex real-time RT-PCR assay for the detection of SARS-CoV-2, influenza A, and influenza B virus RNA. Viral nucleic acids may persist in vivo, independent of viability. Detection of viral nucleic acid does not imply the presence of infectious virus, or that virus nucleic acid is the cause of clinical symptoms. Negative results do not preclude SARS-CoV-2, influenza A, and/or influenza B infection and should not be used as the sole basis for diagnosis, treatment or other patient management decisions. Invalid results may be due to inhibiting substances in the specimen and recollection should occur.                         No results for input(s): "CXBLD" in the last 24 hours.  Invalid input(s): "CXURN"     Recent BLOOD CULTURE No results for input(s): "CXBLD" in the last 24 hours.  Recent URINE CULTURE Invalid input(s): "CXURN"     Cardiac Enzymes       Recent Labs   Lab 05/02/23  1540   hs Troponin 4.1         No results found for: "BNP"         Thyroid Studies              No results for input(s): "CXBLD" in the last 24 hours.  Invalid input(s): "CXURN"     Urinalysis          Invalid input(s): "LEUKOCYTESUR"               MICROBIOLOGY  No results for input(s): "CXBLD" in the last 24 hours.  Invalid input(s): "CXURN"              Rads:   Radiological Procedure reviewed.  Radiology Results (24 Hour)         Procedure Component Value Units Date/Time     CT Angio Chest (PE study) [[id number]] Collected: 05/02/23 1704     Order Status: Completed Updated: 05/02/23 1709     Narrative:       HISTORY: Chest pain     COMPARISON:  None.     TECHNIQUE:  CT angiogram of the chest performed with intravenous contrast.  Multiplanar  reformatted and 3D maximum intensity projection (MIP) images  were created and reviewed. The following dose reduction techniques were  utilized: automated exposure control and/or adjustment of the mA and/or KV  according to patient size, and the use of an iterative reconstruction  technique.     CONTRAST: iohexol (OMNIPAQUE) 350 MG/ML injection 80 mL     FINDINGS:  No detectable pulmonary embolism. 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 Coronary artery calcification.  Aortic valvular calcification. Biatrial dilatation.     Scattered mediastinal and hilar lymph nodes are subcentimeter in short axis  and likely reactive. Small bilateral pleural effusions and atelectasis.  Scarring within the lung bases. The lungs are otherwise clear. No  suspicious pulmonary nodule.     Peripherally calcified lesion within the spleen is statistically benign.  Degenerative change thoracic spine.        Impression:          1. No detectable pulmonary embolism.  2. Small bilateral pleural effusions and atelectasis.     Launa Flight, MD  05/02/2023 5:07 PM     Chest AP Portable [[id number]] Collected: 05/02/23 1603     Order Status: Completed Updated: 05/02/23 1607     Narrative:       HISTORY: Shortness of breath      COMPARISON: 03/28/2023     FINDINGS:   Small left pleural effusion. No new consolidation. No pneumothorax.  Unchanged mediastinal silhouette. No acute osseous abnormality.  Redemonstrated round calcification projecting over the left upper quadrant.           Impression:          Small left pleural effusion.        August Albino  05/02/2023 4:05 PM                Signed by: Lurline Idol, MD

## 2023-05-06 NOTE — Plan of Care (Signed)
Problem: Moderate/High Fall Risk Score >5  Goal: Patient will remain free of falls  Outcome: Progressing  Flowsheets (Taken 05/06/2023 1956)  Moderate Risk (6-13):   LOW-Fall Interventions Appropriate for Low Fall Risk   LOW-Anticoagulation education for injury risk   MOD-Consider activation of bed alarm if appropriate   MOD-Remain with patient during toileting   MOD-Place bedside commode and assistive devices out of sight when not in use   MOD-Perform dangle, stand, walk (DSW) prior to mobilization   MOD-Request PT/OT consult order for patients with gait/mobility impairment   MOD-include family in multidisciplinary POC discussions     Problem: Everyday - Heart Failure  Goal: Stable Vital Signs and Fluid Balance  Outcome: Progressing  Flowsheets (Taken 05/06/2023 1959)  Stable Vital Signs and Fluid Balance:   Daily Standing Weights in the morning using the same scale, after using the bathroom and before breadfast.  If unable to stand, zero the bed and use the bed scale   Monitor, assess vital signs and telemetry per policy   Monitor labs and report abnormalities to physician   Strict Intake/Output   Fluid Restriction   Assess for swelling/edema  Goal: Teaching-Using CHF Warning Zones and Educational Videos  Outcome: Progressing  Flowsheets (Taken 05/06/2023 1959)  Teaching-Using CHF Warning Zones and Educational Videos:   Signs & Symptoms of CHF   Daily Standing Weights & record   CHF Warning Zones and when to call for help   Sodium Restriction   Medications   Fluid Restriction if appropriate   Document in the Education Tab in EPIC with Teach-back

## 2023-05-07 ENCOUNTER — Inpatient Hospital Stay: Payer: Medicare Other

## 2023-05-07 DIAGNOSIS — R0609 Other forms of dyspnea: Secondary | ICD-10-CM

## 2023-05-07 DIAGNOSIS — R079 Chest pain, unspecified: Secondary | ICD-10-CM

## 2023-05-07 DIAGNOSIS — R0603 Acute respiratory distress: Secondary | ICD-10-CM

## 2023-05-07 LAB — ECG 12-LEAD
Atrial Rate: 80 {beats}/min
IHS MUSE NARRATIVE AND IMPRESSION: NORMAL
P Axis: 27 degrees
P-R Interval: 150 ms
Q-T Interval: 418 ms
QRS Duration: 84 ms
QTC Calculation (Bezet): 482 ms
R Axis: 13 degrees
T Axis: 54 degrees
Ventricular Rate: 80 {beats}/min

## 2023-05-07 LAB — HIGH SENSITIVITY TROPONIN-I WITH DELTA
hs Troponin-I Delta: 2
hs Troponin: 5.5 ng/L (ref <=14.0)

## 2023-05-07 LAB — NM MYOCARDIAL PERFUSION SPECT W STRESS AND REST: Stress LV Ejection Fraction: 75

## 2023-05-07 LAB — URINE MICROALBUMIN, RANDOM
Urine Creatinine: 115.4 mg/dL
Urine Microalbumin/Creatinine Ratio: 34 ug/mg — ABNORMAL HIGH (ref <30)
Urine Microalbumin: 38.8 ug/ml — ABNORMAL HIGH (ref 0.0–30.0)

## 2023-05-07 MED ORDER — REGADENOSON 0.4 MG/5ML IV SOLN
0.4000 mg | Freq: Once | INTRAVENOUS | Status: AC | PRN
Start: 2023-05-07 — End: 2023-05-07
  Administered 2023-05-07: 0.4 mg via INTRAVENOUS
  Filled 2023-05-07: qty 5

## 2023-05-07 MED ORDER — TECHNETIUM TC 99M TETROFOSMIN IV KIT
10.0000 | PACK | Freq: Once | INTRAVENOUS | Status: AC | PRN
Start: 2023-05-07 — End: 2023-05-07
  Administered 2023-05-07: 10 via INTRAVENOUS
  Filled 2023-05-07: qty 100

## 2023-05-07 MED ORDER — MAGNESIUM SULFATE IN D5W 1-5 GM/100ML-% IV SOLN
1.0000 g | INTRAVENOUS | Status: AC
Start: 2023-05-07 — End: 2023-05-07
  Administered 2023-05-07 (×2): 1 g via INTRAVENOUS
  Filled 2023-05-07 (×2): qty 100

## 2023-05-07 MED ORDER — TECHNETIUM TC 99M TETROFOSMIN IV KIT
30.0000 | PACK | Freq: Once | INTRAVENOUS | Status: AC | PRN
Start: 2023-05-07 — End: 2023-05-07
  Administered 2023-05-07: 30 via INTRAVENOUS
  Filled 2023-05-07: qty 100

## 2023-05-07 MED ORDER — DOXAZOSIN MESYLATE 2 MG PO TABS
1.0000 mg | ORAL_TABLET | Freq: Every evening | ORAL | Status: DC
Start: 2023-05-07 — End: 2023-05-08
  Administered 2023-05-07: 1 mg via ORAL
  Filled 2023-05-07 (×2): qty 1

## 2023-05-07 MED ORDER — SACUBITRIL-VALSARTAN 49-51 MG PO TABS
1.0000 | ORAL_TABLET | Freq: Two times a day (BID) | ORAL | Status: DC
Start: 2023-05-07 — End: 2023-05-08
  Administered 2023-05-07 – 2023-05-08 (×2): 1 via ORAL
  Filled 2023-05-07 (×5): qty 1

## 2023-05-07 MED ORDER — SACUBITRIL-VALSARTAN 97-103 MG PO TABS
1.0000 | ORAL_TABLET | Freq: Two times a day (BID) | ORAL | Status: DC
Start: 2023-05-07 — End: 2023-05-07
  Filled 2023-05-07 (×3): qty 1

## 2023-05-07 MED ORDER — HYDRALAZINE HCL 10 MG PO TABS
10.0000 mg | ORAL_TABLET | Freq: Three times a day (TID) | ORAL | Status: DC
Start: 2023-05-07 — End: 2023-05-07

## 2023-05-07 MED ORDER — FUROSEMIDE 40 MG PO TABS
40.0000 mg | ORAL_TABLET | Freq: Every day | ORAL | Status: DC
Start: 2023-05-07 — End: 2023-05-08
  Administered 2023-05-08: 40 mg via ORAL
  Filled 2023-05-07: qty 1

## 2023-05-07 NOTE — Progress Notes (Signed)
Patient had complaint of eyes "feeling funny" pressure at the bridge of her nose and blurred vision, 30 min prior patient developed indigestion and nausea and received one time does of zofran IV. Blood pressure was checked and reading was 85/47. RRT called. Patient received NS bolus with recheck was 94/52. Dr. Elenora Fender notified and additional bolus ordered and currently running. Dr. Fawn Kirk was called with update. Per Dr. Fawn Kirk, hold BP med until she can speak with cardiologist in the morning. Patient currently stating that nausea is better but continues with the pressure in eyes.

## 2023-05-07 NOTE — H&P (Signed)
PROGRESS NOTE     Date Time: 05/03/23 12:10 PM  Patient Name: Becky Burton, Becky Burton         Patient Active Problem List   Diagnosis    Dyspnea                  Assessment/ Plan:   [name] is a 77 y.o. female with  Acute congestive heart failure. Secondary to Grade  III diastolic dysfunction (restrictive pattern) with elevated left atrial pressure   Meds adjustment as per Cardiologist   Continue fluid restriction to 1500 cc/day.  Low-salt diet.  Patient scheduled for stress testtoday    Hypokalemia replace potassium    Hypertension stable continue her Cardura 2 mg once a day ,Increase hydralazine 50 mg every 8 as valsartan 320 mg once a day Jardiance 10 mg 1 daily     Hypothyroidism continue Synthroid 112 mcg once a day     Hypercholesterolemia continue Lipitor 10 mg once a day  Gastroesophageal reflux disease continue her Protonix and famotidine  Mild hyponatremia most likely due to fluid overload will repeat labs in the morning.  Thrombocytopenia     Anemia, work up ordered   Obesity   DVT prophylaxis  SCD/TEDS bilateral LE             All labs/ notes from last 24 hrs reviewed  Will continue rest of the plan same  Care Plan  D/W nursing,consultants,case manager     This note was generated by the Epic EMR system/ Dragon speech recognition and may contain inherent errors or omissions not intended by the user. Grammatical errors, random word insertions, deletions, pronoun errors and incomplete sentences are occasional consequences of this technology due to software limitations. Not all errors are caught or corrected. If there are questions or concerns about the content of this note or information contained within the body of this dictation they should be addressed directly with the author for clarification        Subjective:   Patient Seen and Examined. The notes from the last 24 hours were reviewed   pt Blood pressure dropped lasdt night had rapid response,feels better now      Medications:              Current Facility-Administered Medications   Medication Dose Route Frequency    atorvastatin  10 mg Oral Daily    bisoprolol  10 mg Oral Daily    doxazosin  2 mg Oral QHS    famotidine  40 mg Oral Daily    furosemide  20 mg Intravenous Daily    heparin (porcine)  5,000 Units Subcutaneous Q12H [facility]    levothyroxine  112 mcg Oral Daily at 0600    pantoprazole  40 mg Oral QAM AC    timolol  1 drop Both Eyes Daily    valsartan  320 mg Oral Daily                  Review of Systems:      A comprehensive review of systems was: obtained from nurse ,chart review and the patient   General ROS: negative for - fatigue , lightheadedness.or dizziness blurry vision  ENT ROS: negative for - headaches, nasal congestion or tinnitus, no sore throat, no ear ache   Gastrointestinal ROS: no abdominal pain,   no nausea vomiting  Neurological ROS: negative for - , headaches,  numbness/tingling   GU,nofrequency , urgency ,or burning micturation.  Physical Exam:      Patient Vitals for the past 24 hrs:    BP Temp Temp src Pulse Resp SpO2 Height Weight   05/03/23 1130 150/67 98.1 F (36.7 C) Oral (!) 55 20 95 % -- --   05/03/23 0751 198/68 98.4 F (36.9 C) Oral (!) 56 18 95 % -- --   05/03/23 0638 -- -- -- -- -- -- -- 76.5 kg (168 lb 11.2 oz)   05/03/23 0347 172/75 98.2 F (36.8 C) Oral (!) 52 17 91 % -- --   05/03/23 0013 142/60 98.1 F (36.7 C) Oral (!) 50 18 94 % -- --   05/02/23 2127 180/69 -- -- (!) 57 -- -- -- --   05/02/23 2126 192/63 -- -- 60 -- -- -- --   05/02/23 1942 186/71 98.2 F (36.8 C) Oral (!) 59 18 94 % -- --   05/02/23 1912 -- -- -- -- -- -- -- 76.4 kg (168 lb 8 oz)   05/02/23 1835 -- -- -- -- -- -- 1.499 m (4\' 11" ) --   05/02/23 1825 187/74 97.5 F (36.4 C) Oral 62 -- 94 % -- --   05/02/23 1730 171/71 -- -- 60 18 95 % -- --   05/02/23 1700 161/79 98.6 F (37 C) Oral 60 20 96 % -- --   05/02/23 1511 170/78 98 F (36.7 C) Oral 92 20 98 % -- 77 kg (169 lb 12.1 oz)      Intake and Output  Summary (Last 24 hours) at Date Time     Intake/Output Summary (Last 24 hours) at 05/03/2023 1210  Last data filed at 05/03/2023 1610      Gross per 24 hour   Intake --   Output 100 ml   Net -100 ml         General Appearance-Well appearing, no acute distress  HEENT- PERRLA, EOMI, Sclera clear. Oropharynx -normal   Neck-Supple, no masses or abnormal adenopathy. No JVD or bruits.  Lungs- Bilaterally symmetrical equal in expansion on auscultation bilateral  basal crackles   CV- RRR, normal S1, S2, no rubs/murmurs/gallops1 plus edemas  Abdomen-Not distended, soft non tender. +BS, No masses palpated. No HSM  Neuro -Alert oriented no focal motor sensory deficit  Muscloskeletal-No cyanosis, clubbing, or edema, periopheral pulsus positive  no joint tenderness, deformity or swelling  Skin no lesion warm and dry   Psych mood and affects are appropriate     Labs:           Recent Labs   Lab 05/03/23  0434 05/02/23  1540   WBC 5.22 6.16   RBC 3.39* 3.74*   Hemoglobin 9.8* 10.8*   Hematocrit 29.9* 32.5*   MCV 88.2 86.9   Platelet Count 245 115*              Recent Labs   Lab 05/03/23  0434 05/02/23  1540   Sodium 135 134*   Potassium 4.0 3.9   Chloride 101 99   CO2 25 27   BUN 7 8   Creatinine 0.7 0.7   Glucose 85 108*   Calcium 8.5 9.0                Invalid input(s): "OSMOL"         Recent Labs   Lab 05/02/23  1540   ALT 9   AST (SGOT) 14   Bilirubin, Total 0.5   Albumin 3.4*   Alkaline Phosphatase 53  Recent Labs   Lab 05/02/23  1540   hs Troponin 4.1             Microbiology, reviewed and are significant for:  Microbiology Results (last 15 days)         Procedure Component Value Units Date/Time     COVID-19 and Influenza ([facility]) (symptomatic) [[id number]]  (Normal) Collected: 05/02/23 1542     Order Status: Completed Specimen: Swab from Anterior Nares Updated: 05/02/23 1645       SARS-CoV-2 (COVID-19) RNA Not Detected       Influenza A RNA Not Detected       Influenza B RNA Not Detected     Narrative:       A result  of "Detected" indicates POSITIVE for the presence of viral RNA  A result of "Not Detected" indicates NEGATIVE for the presence of viral RNA     Test performed using the Roche cobas [place] SARS-CoV-2 & Influenza A/B assay. This is a multiplex real-time RT-PCR assay for the detection of SARS-CoV-2, influenza A, and influenza B virus RNA. Viral nucleic acids may persist in vivo, independent of viability. Detection of viral nucleic acid does not imply the presence of infectious virus, or that virus nucleic acid is the cause of clinical symptoms. Negative results do not preclude SARS-CoV-2, influenza A, and/or influenza B infection and should not be used as the sole basis for diagnosis, treatment or other patient management decisions. Invalid results may be due to inhibiting substances in the specimen and recollection should occur.                         No results for input(s): "CXBLD" in the last 24 hours.  Invalid input(s): "CXURN"     Recent BLOOD CULTURE No results for input(s): "CXBLD" in the last 24 hours.  Recent URINE CULTURE Invalid input(s): "CXURN"     Cardiac Enzymes       Recent Labs   Lab 05/02/23  1540   hs Troponin 4.1         No results found for: "BNP"         Thyroid Studies              No results for input(s): "CXBLD" in the last 24 hours.  Invalid input(s): "CXURN"     Urinalysis          Invalid input(s): "LEUKOCYTESUR"               MICROBIOLOGY  No results for input(s): "CXBLD" in the last 24 hours.  Invalid input(s): "CXURN"              Rads:   Radiological Procedure reviewed.  Radiology Results (24 Hour)         Procedure Component Value Units Date/Time     CT Angio Chest (PE study) [[id number]] Collected: 05/02/23 1704     Order Status: Completed Updated: 05/02/23 1709     Narrative:       HISTORY: Chest pain     COMPARISON: None.     TECHNIQUE:  CT angiogram of the chest performed with intravenous contrast.  Multiplanar reformatted and 3D maximum intensity projection (MIP) images  were  created and reviewed. The following dose reduction techniques were  utilized: automated exposure control and/or adjustment of the mA and/or KV  according to patient size, and the use of an iterative reconstruction  technique.     CONTRAST: iohexol (OMNIPAQUE) 350 MG/ML  injection 80 mL     FINDINGS:  No detectable pulmonary embolism. 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 Coronary artery calcification.  Aortic valvular calcification. Biatrial dilatation.     Scattered mediastinal and hilar lymph nodes are subcentimeter in short axis  and likely reactive. Small bilateral pleural effusions and atelectasis.  Scarring within the lung bases. The lungs are otherwise clear. No  suspicious pulmonary nodule.     Peripherally calcified lesion within the spleen is statistically benign.  Degenerative change thoracic spine.        Impression:          1. No detectable pulmonary embolism.  2. Small bilateral pleural effusions and atelectasis.     Launa Flight, MD  05/02/2023 5:07 PM     Chest AP Portable [[id number]] Collected: 05/02/23 1603     Order Status: Completed Updated: 05/02/23 1607     Narrative:       HISTORY: Shortness of breath      COMPARISON: 03/28/2023     FINDINGS:   Small left pleural effusion. No new consolidation. No pneumothorax.  Unchanged mediastinal silhouette. No acute osseous abnormality.  Redemonstrated round calcification projecting over the left upper quadrant.           Impression:          Small left pleural effusion.        August Albino  05/02/2023 4:05 PM                Signed by: Lurline Idol, MD

## 2023-05-07 NOTE — Plan of Care (Signed)
Problem: Safety  Goal: Patient will be free from injury during hospitalization  Outcome: Progressing  Flowsheets (Taken 05/07/2023 1507)  Patient will be free from injury during hospitalization:   Assess patient's risk for falls and implement fall prevention plan of care per policy   Provide and maintain safe environment   Use appropriate transfer methods   Ensure appropriate safety devices are available at the bedside     Problem: Hemodynamic Status: Cardiac  Goal: Stable vital signs and fluid balance  Outcome: Progressing  Flowsheets (Taken 05/07/2023 1507)  Stable vital signs and fluid balance:   Assess signs and symptoms associated with cardiac rhythm changes   Monitor lab values     Problem: Everyday - Heart Failure  Goal: Teaching-Using CHF Warning Zones and Educational Videos  Outcome: Progressing  Flowsheets (Taken 05/07/2023 1507)  Teaching-Using CHF Warning Zones and Educational Videos:   Signs & Symptoms of CHF   CHF Warning Zones and when to call for help   Sodium Restriction   Fluid Restriction if appropriate   Medications

## 2023-05-07 NOTE — Progress Notes (Signed)
[facility] PROGRESS NOTE     Date Time:    05/07/23    8:24 AM  Patient Name: Becky Burton    LOS: 5 days     Subjective:  No cardiac complaints this morning but did have an episode of symptomatic hypotension last night.    We reviewed her medications in detail and she does see a cardiologist in [place].  She will be in this area for a bit longer and would like to follow-up with [place] in about 2 weeks.    She is reading the book [employer]    Scheduled Meds:   atorvastatin, 10 mg, Oral, Daily  doxazosin, 2 mg, Oral, QHS  empagliflozin, 10 mg, Oral, Daily  famotidine, 40 mg, Oral, Daily  heparin (porcine), 5,000 Units, Subcutaneous, Q12H SCH  hydrALAZINE, 10 mg, Oral, Q8H SCH  levothyroxine, 112 mcg, Oral, Daily at 0600  magnesium sulfate, 1 g, Intravenous, Q1H  melatonin, 3 mg, Oral, Once  ondansetron, 4 mg, Intravenous, Once  pantoprazole, 40 mg, Oral, QAM AC  timolol, 1 drop, Both Eyes, Daily  vitamin B-12, 1,000 mcg, Intramuscular, Daily     Continuous Infusions:   PRN Meds: acetaminophen, hydrALAZINE, ondansetron    Objective:  Physical Exam:  BP 110/67   Pulse (!) 59   Temp 98.4 F (36.9 C) (Oral)   Resp 16   Ht 1.499 m (4\' 11" )   Wt 72.7 kg (160 lb 3.2 oz)   SpO2 92%   BMI 32.36 kg/m      Intake/Output Summary (Last 24 hours) at 05/07/2023 0700  Last data filed at 05/07/2023 0700  Gross per 24 hour   Intake 680 ml   Output 2750 ml   Net -2070 ml     Wt Readings from Last 1 Encounters:   05/07/23 0530 72.7 kg (160 lb 3.2 oz)   05/06/23 0630 74.5 kg (164 lb 4.8 oz)   05/05/23 0637 76.1 kg (167 lb 12.8 oz)   05/04/23 0620 76 kg (167 lb 9.6 oz)   05/03/23 0638 76.5 kg (168 lb 11.2 oz)   05/02/23 1912 76.4 kg (168 lb 8 oz)   05/02/23 1511 77 kg (169 lb 12.1 oz)     General: Awake, alert, oriented x 3, no acute distress.  Overweight female.  HEENT:  NCAT EOMI  NECK:  No obvious JVD.  No bruits.  Cardiovascular: Regular rate and rhythm, no murmurs, rubs or gallops     Lungs: Clear lung  fields bilaterally  Abdomen: Soft, non-tender  Extremities: Trace nonpitting edema.  2+ Pulses X4.  NEURO:  Mentating well.  No focal deficits.    Lab Review:   Recent Labs     05/06/23  0359 05/05/23  0421   WBC 5.88 6.07   Hemoglobin 11.6 10.3*   Hematocrit 34.9 31.0*   Platelet Count 303 259     Recent Labs     05/07/23  0438 05/06/23  2313   hs Troponin 5.5 7.3    Recent Labs     05/06/23  2316 05/06/23  0359 05/05/23  0422   Sodium 135 134* 134*   Potassium 3.6 3.3* 3.8   CO2 23 25 26    BUN 12 8 10    Creatinine 0.8 0.7 0.6   Glucose 100 99 89   Magnesium 1.8  --   --     Estimated Creatinine Clearance: 51.1 mL/min (based on SCr of 0.8 mg/dL).   No results for input(s): "INR", "TSH", "  CHOL", "TRIG", "HDL", "LDL" in the last 72 hours.       Telemetry: SR with occ PVC     Imaging:  Radiology Results (24 Hour)       ** No results found for the last 24 hours. **          TTE 05/03/23  * Left ventricular systolic function is normal with an estimated EF of 60-65%. Diastolic dysfunction exists.  * The left atrium is moderately dilated.  * Moderate pulmonary hypertension with estimated RV systolic pressure 51 mm Hg    Assessment/Plan:   Becky Burton is a 77 y.o. female with a history of HTN, HLD, GERD, and hypothyroidism who presented to the hospital on 05/02/2023 with chest discomfort and DOE.   Patient reports that she originally went to [facility] for a rash.  She was found to be hypertensive with blood pressures in the 200s systolic.  She was then referred to [facility] for further evaluation.   She reports that she experienced left-sided chest heaviness, and difficulty breathing recently. She noticed she had occasional wheezing that she did not have previously.  She reports she has had difficulty sleeping due to her breathing, and was unable to lie comfortably. Patient reports she sleeps on 2 pillows at night.     In the ED, her NT-ProBNP was elevated 2,322. Chest CT revealed small bilateral pleural  effusions and atelectasis. CT was negative for P.E.      Chest discomfort/orthopnea: elevated NT-proBNP, chest CT with small bilateral pleural effusions. Diuresed well   HFpEF: with G3DD, suspect secondary to longstanding suboptimally controlled HTN  Moderate pulmonary HTN on echo 05/03/23 (with no detectable pulmonary embolism on chest CTA on 6/28)  HTN: controlled, but having some hpoytension now.   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 I concur with her/his documentation of this patient--with these modifications made directly to the note.       Signed by: Metro Kung, MD     [facility]   [employer] NP [employer] [phone] or [phone] (M-F 8 am-5 pm)   Tyson Babinski [facility] [employer] [phone]  (M-F 8 am-5 pm)  After Hours On-Call Physician: [phone]

## 2023-05-07 NOTE — Plan of Care (Signed)
Problem: Hemodynamic Status: Cardiac  Goal: Stable vital signs and fluid balance  Outcome: Progressing  Flowsheets (Taken 05/07/2023 2206)  Stable vital signs and fluid balance:   Assess signs and symptoms associated with cardiac rhythm changes   Monitor lab values     Problem: Everyday - Heart Failure  Goal: Stable Vital Signs and Fluid Balance  Outcome: Progressing  Flowsheets (Taken 05/07/2023 2206)  Stable Vital Signs and Fluid Balance:   Daily Standing Weights in the morning using the same scale, after using the bathroom and before breadfast.  If unable to stand, zero the bed and use the bed scale   Monitor, assess vital signs and telemetry per policy   Fluid Restriction   Assess for swelling/edema   Monitor labs and report abnormalities to physician   Wean oxygen as needed if appropriate   Strict Intake/Output

## 2023-05-08 ENCOUNTER — Encounter: Payer: Self-pay | Admitting: Internal Medicine

## 2023-05-08 DIAGNOSIS — D638 Anemia in other chronic diseases classified elsewhere: Secondary | ICD-10-CM | POA: Diagnosis present

## 2023-05-08 DIAGNOSIS — E871 Hypo-osmolality and hyponatremia: Secondary | ICD-10-CM

## 2023-05-08 DIAGNOSIS — E6609 Other obesity due to excess calories: Secondary | ICD-10-CM | POA: Insufficient documentation

## 2023-05-08 DIAGNOSIS — E7800 Pure hypercholesterolemia, unspecified: Secondary | ICD-10-CM | POA: Diagnosis present

## 2023-05-08 DIAGNOSIS — R001 Bradycardia, unspecified: Secondary | ICD-10-CM | POA: Diagnosis present

## 2023-05-08 DIAGNOSIS — I5033 Acute on chronic diastolic (congestive) heart failure: Secondary | ICD-10-CM | POA: Diagnosis present

## 2023-05-08 DIAGNOSIS — Z6832 Body mass index (BMI) 32.0-32.9, adult: Secondary | ICD-10-CM

## 2023-05-08 DIAGNOSIS — R0603 Acute respiratory distress: Secondary | ICD-10-CM

## 2023-05-08 DIAGNOSIS — I10 Essential (primary) hypertension: Secondary | ICD-10-CM | POA: Diagnosis present

## 2023-05-08 DIAGNOSIS — K219 Gastro-esophageal reflux disease without esophagitis: Secondary | ICD-10-CM | POA: Diagnosis present

## 2023-05-08 DIAGNOSIS — E039 Hypothyroidism, unspecified: Secondary | ICD-10-CM | POA: Diagnosis present

## 2023-05-08 LAB — MAGNESIUM: Magnesium: 2.1 mg/dL (ref 1.6–2.6)

## 2023-05-08 LAB — BASIC METABOLIC PANEL
Anion Gap: 6 (ref 5.0–15.0)
BUN: 14 mg/dL (ref 7–21)
CO2: 26 mEq/L (ref 17–29)
Calcium: 8.8 mg/dL (ref 7.9–10.2)
Chloride: 98 mEq/L — ABNORMAL LOW (ref 99–111)
Creatinine: 0.7 mg/dL (ref 0.4–1.0)
GFR: >60 mL/min/{1.73_m2} (ref >=60.0)
Glucose: 94 mg/dL (ref 70–100)
Potassium: 3.9 mEq/L (ref 3.5–5.3)
Sodium: 130 mEq/L — ABNORMAL LOW (ref 135–145)

## 2023-05-08 MED ORDER — SACUBITRIL-VALSARTAN 49-51 MG PO TABS
1.0000 | ORAL_TABLET | Freq: Two times a day (BID) | ORAL | 1 refills | Status: AC
Start: 2023-05-08 — End: ?

## 2023-05-08 MED ORDER — EMPAGLIFLOZIN 10 MG PO TABS
10.0000 mg | ORAL_TABLET | Freq: Every day | ORAL | 1 refills | Status: DC
Start: 2023-05-09 — End: 2023-05-27

## 2023-05-08 MED ORDER — FUROSEMIDE 40 MG PO TABS
40.0000 mg | ORAL_TABLET | Freq: Every day | ORAL | 0 refills | Status: AC
Start: 2023-05-09 — End: ?

## 2023-05-08 MED ORDER — SACUBITRIL-VALSARTAN 49-51 MG PO TABS
1.0000 | ORAL_TABLET | Freq: Two times a day (BID) | ORAL | Status: DC
Start: 2023-05-08 — End: 2023-05-08

## 2023-05-08 MED ORDER — CYANOCOBALAMIN 1000 MCG PO TABS
1000.0000 ug | ORAL_TABLET | Freq: Every day | ORAL | 1 refills | Status: AC
Start: 2023-05-08 — End: ?

## 2023-05-08 MED ORDER — EMPAGLIFLOZIN 10 MG PO TABS
10.0000 mg | ORAL_TABLET | Freq: Every day | ORAL | 1 refills | Status: DC
Start: 2023-05-09 — End: 2023-05-08

## 2023-05-08 MED ORDER — SACUBITRIL-VALSARTAN 49-51 MG PO TABS
1.0000 | ORAL_TABLET | Freq: Two times a day (BID) | ORAL | 1 refills | Status: DC
Start: 2023-05-08 — End: 2023-05-08

## 2023-05-08 MED ORDER — FUROSEMIDE 40 MG PO TABS
40.0000 mg | ORAL_TABLET | Freq: Every day | ORAL | 0 refills | Status: DC
Start: 2023-05-09 — End: 2023-05-08

## 2023-05-08 MED ORDER — DOXAZOSIN MESYLATE 1 MG PO TABS
1.0000 mg | ORAL_TABLET | Freq: Every evening | ORAL | 0 refills | Status: DC
Start: 2023-05-08 — End: 2023-05-08

## 2023-05-08 MED ORDER — DOXAZOSIN MESYLATE 1 MG PO TABS
1.0000 mg | ORAL_TABLET | Freq: Every evening | ORAL | 0 refills | Status: AC
Start: 2023-05-08 — End: ?

## 2023-05-08 NOTE — H&P (Addendum)
PROGRESS NOTE     Date Time: 05/03/23 12:10 PM  Patient Name: Becky Burton, Becky Burton         Patient Active Problem List   Diagnosis    Dyspnea                  Assessment/ Plan:   Becky Burton is a 77 y.o. female with  Acute congestive heart failure. Secondary to Grade  III diastolic dysfunction (restrictive pattern) with elevated left atrial pressure   Stress test unremarkable  Meds adjustment as per Cardiologist   Continue fluid restriction to 1500 cc/day.  Low-salt diet.  Stress test unremarkable   Pt on entresto 49/51  Jardiance 10 mg 1 daily  Cardura 1 mg daily  Lasix 40 mg 1 daily   Further plan as per cardiology  Most likley [place] today or tomorrow as per there recommendation    Hypokalemia resolved   Bradycardia ,  Off novobilol    Hypertension   Pt on entresto 49/51 mg BID   Jardiance 10 mg 1 daily  Cardura 1 mg daily  Lasix 40 mg 1 daily   Repeat BMP in 1 5 days    Hypothyroidism continue Synthroid 112 mcg once a day  TSH 0.93     Hypercholesterolemia continue Lipitor 10 mg once a day  Gastroesophageal reflux disease continue her Protonix and famotidine  Mild hyponatremia due to diuretics  Thrombocytopenia resolved      Anemia,of ch disease  Vitamin b12 def  Cont vitamin b12 inj for 5 doses   Vitamin b12 1000ug 1 daily as out pt   Obesity   DVT prophylaxis  SCD/TEDS bilateral LE  heparin 5000unit SQ BID            All labs/ notes from last 24 hrs reviewed  Will continue rest of the plan same  Care Plan  D/W nursing,consultants,case manager     This note was generated by the Epic EMR system/ Dragon speech recognition and may contain inherent errors or omissions not intended by the user. Grammatical errors, random word insertions, deletions, pronoun errors and incomplete sentences are occasional consequences of this technology due to software limitations. Not all errors are caught or corrected. If there are questions or concerns about the content of this note or information contained within the  body of this dictation they should be addressed directly with the author for clarification        Subjective:   Patient Seen and Examined. The notes from the last 24 hours were reviewed   feels better no chest pressure, ,able tolay gflat, denies any chest pain or SOB      Medications:             Current Facility-Administered Medications   Medication Dose Route Frequency    atorvastatin  10 mg Oral Daily    bisoprolol  10 mg Oral Daily    doxazosin  2 mg Oral QHS    famotidine  40 mg Oral Daily    furosemide  20 mg Intravenous Daily    heparin (porcine)  5,000 Units Subcutaneous Q12H [facility]    levothyroxine  112 mcg Oral Daily at 0600    pantoprazole  40 mg Oral QAM AC    timolol  1 drop Both Eyes Daily    valsartan  320 mg Oral Daily                  Review of Systems:  A comprehensive review of systems was: obtained from nurse ,chart review and the patient   General ROS: negative for - fatigue , lightheadedness.or dizziness blurry vision  ENT ROS: negative for - headaches, nasal congestion or tinnitus, no sore throat, no ear ache   Gastrointestinal ROS: no abdominal pain,   no nausea vomiting  Neurological ROS: negative for - , headaches,  numbness/tingling   GU,nofrequency , urgency ,or burning micturation.                    Physical Exam:      Patient Vitals for the past 24 hrs:    BP Temp Temp src Pulse Resp SpO2 Height Weight   05/03/23 1130 150/67 98.1 F (36.7 C) Oral (!) 55 20 95 % -- --   05/03/23 0751 198/68 98.4 F (36.9 C) Oral (!) 56 18 95 % -- --   05/03/23 0638 -- -- -- -- -- -- -- 76.5 kg (168 lb 11.2 oz)   05/03/23 0347 172/75 98.2 F (36.8 C) Oral (!) 52 17 91 % -- --   05/03/23 0013 142/60 98.1 F (36.7 C) Oral (!) 50 18 94 % -- --   05/02/23 2127 180/69 -- -- (!) 57 -- -- -- --   05/02/23 2126 192/63 -- -- 60 -- -- -- --   05/02/23 1942 186/71 98.2 F (36.8 C) Oral (!) 59 18 94 % -- --   05/02/23 1912 -- -- -- -- -- -- -- 76.4 kg (168 lb 8 oz)   05/02/23 1835 -- -- -- -- -- -- 1.499 m (4'  11") --   05/02/23 1825 187/74 97.5 F (36.4 C) Oral 62 -- 94 % -- --   05/02/23 1730 171/71 -- -- 60 18 95 % -- --   05/02/23 1700 161/79 98.6 F (37 C) Oral 60 20 96 % -- --   05/02/23 1511 170/78 98 F (36.7 C) Oral 92 20 98 % -- 77 kg (169 lb 12.1 oz)      Intake and Output Summary (Last 24 hours) at Date Time     Intake/Output Summary (Last 24 hours) at 05/03/2023 1210  Last data filed at 05/03/2023 1610      Gross per 24 hour   Intake --   Output 100 ml   Net -100 ml         General Appearance-Well appearing, no acute distress  HEENT- PERRLA, EOMI, Sclera clear. Oropharynx -normal   Neck-Supple, no masses or abnormal adenopathy. No JVD or bruits.  Lungs- Bilaterally symmetrical equal in expansion on auscultation bilateral  basal crackles   CV- RRR, normal S1, S2, no rubs/murmurs/gallops1 plus edemas  Abdomen-Not distended, soft non tender. +BS, No masses palpated. No HSM  Neuro -Alert oriented no focal motor sensory deficit  Muscloskeletal-No cyanosis, clubbing, or edema, periopheral pulsus positive  no joint tenderness, deformity or swelling  Skin no lesion warm and dry   Psych mood and affects are appropriate     Labs:           Recent Labs   Lab 05/03/23  0434 05/02/23  1540   WBC 5.22 6.16   RBC 3.39* 3.74*   Hemoglobin 9.8* 10.8*   Hematocrit 29.9* 32.5*   MCV 88.2 86.9   Platelet Count 245 115*              Recent Labs   Lab 05/03/23  0434 05/02/23  1540   Sodium 135 134*  Potassium 4.0 3.9   Chloride 101 99   CO2 25 27   BUN 7 8   Creatinine 0.7 0.7   Glucose 85 108*   Calcium 8.5 9.0                Invalid input(s): "OSMOL"         Recent Labs   Lab 05/02/23  1540   ALT 9   AST (SGOT) 14   Bilirubin, Total 0.5   Albumin 3.4*   Alkaline Phosphatase 53             Recent Labs   Lab 05/02/23  1540   hs Troponin 4.1             Microbiology, reviewed and are significant for:  Microbiology Results (last 15 days)         Procedure Component Value Units Date/Time     COVID-19 and Influenza ([place])  (symptomatic) [[id number]]  (Normal) Collected: 05/02/23 1542     Order Status: Completed Specimen: Swab from Anterior Nares Updated: 05/02/23 1645       SARS-CoV-2 (COVID-19) RNA Not Detected       Influenza A RNA Not Detected       Influenza B RNA Not Detected     Narrative:       A result of "Detected" indicates POSITIVE for the presence of viral RNA  A result of "Not Detected" indicates NEGATIVE for the presence of viral RNA     Test performed using the Roche cobas [place] SARS-CoV-2 & Influenza A/B assay. This is a multiplex real-time RT-PCR assay for the detection of SARS-CoV-2, influenza A, and influenza B virus RNA. Viral nucleic acids may persist in vivo, independent of viability. Detection of viral nucleic acid does not imply the presence of infectious virus, or that virus nucleic acid is the cause of clinical symptoms. Negative results do not preclude SARS-CoV-2, influenza A, and/or influenza B infection and should not be used as the sole basis for diagnosis, treatment or other patient management decisions. Invalid results may be due to inhibiting substances in the specimen and recollection should occur.                         No results for input(s): "CXBLD" in the last 24 hours.  Invalid input(s): "CXURN"     Recent BLOOD CULTURE No results for input(s): "CXBLD" in the last 24 hours.  Recent URINE CULTURE Invalid input(s): "CXURN"     Cardiac Enzymes       Recent Labs   Lab 05/02/23  1540   hs Troponin 4.1         No results found for: "BNP"         Thyroid Studies              No results for input(s): "CXBLD" in the last 24 hours.  Invalid input(s): "CXURN"     Urinalysis          Invalid input(s): "LEUKOCYTESUR"               MICROBIOLOGY  No results for input(s): "CXBLD" in the last 24 hours.  Invalid input(s): "CXURN"              Rads:   Radiological Procedure reviewed.  Radiology Results (24 Hour)         Procedure Component Value Units Date/Time     CT Angio Chest (PE study) [[id number]] Collected:  05/02/23  1704     Order Status: Completed Updated: 05/02/23 1709     Narrative:       HISTORY: Chest pain     COMPARISON: None.     TECHNIQUE:  CT angiogram of the chest performed with intravenous contrast.  Multiplanar reformatted and 3D maximum intensity projection (MIP) images  were created and reviewed. The following dose reduction techniques were  utilized: automated exposure control and/or adjustment of the mA and/or KV  according to patient size, and the use of an iterative reconstruction  technique.     CONTRAST: iohexol (OMNIPAQUE) 350 MG/ML injection 80 mL     FINDINGS:  No detectable pulmonary embolism. 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 Coronary artery calcification.  Aortic valvular calcification. Biatrial dilatation.     Scattered mediastinal and hilar lymph nodes are subcentimeter in short axis  and likely reactive. Small bilateral pleural effusions and atelectasis.  Scarring within the lung bases. The lungs are otherwise clear. No  suspicious pulmonary nodule.     Peripherally calcified lesion within the spleen is statistically benign.  Degenerative change thoracic spine.        Impression:          1. No detectable pulmonary embolism.  2. Small bilateral pleural effusions and atelectasis.     Launa Flight, MD  05/02/2023 5:07 PM     Chest AP Portable [[id number]] Collected: 05/02/23 1603     Order Status: Completed Updated: 05/02/23 1607     Narrative:       HISTORY: Shortness of breath      COMPARISON: 03/28/2023     FINDINGS:   Small left pleural effusion. No new consolidation. No pneumothorax.  Unchanged mediastinal silhouette. No acute osseous abnormality.  Redemonstrated round calcification projecting over the left upper quadrant.           Impression:          Small left pleural effusion.        August Albino  05/02/2023 4:05 PM                Signed by: Lurline Idol, MD

## 2023-05-08 NOTE — Progress Notes (Signed)
[facility] PROGRESS NOTE     Date Time:    05/08/23    9:42 AM  Patient Name: Becky Burton    LOS: 6 days     Subjective:  Reports she feels well today. Denies any further chest heaviness or SOB. She is concerned that her blood pressure has continued to be elevated today.     Scheduled Meds:   atorvastatin, 10 mg, Oral, Daily  doxazosin, 1 mg, Oral, QHS  empagliflozin, 10 mg, Oral, Daily  famotidine, 40 mg, Oral, Daily  furosemide, 40 mg, Oral, Daily  heparin (porcine), 5,000 Units, Subcutaneous, Q12H [facility]  levothyroxine, 112 mcg, Oral, Daily at 0600  melatonin, 3 mg, Oral, Once  ondansetron, 4 mg, Intravenous, Once  pantoprazole, 40 mg, Oral, QAM AC  sacubitril-valsartan, 1 tablet, Oral, Q12H [facility]  timolol, 1 drop, Both Eyes, Daily  vitamin B-12, 1,000 mcg, Intramuscular, Daily     Continuous Infusions:   PRN Meds: acetaminophen, hydrALAZINE, ondansetron    Objective:  Physical Exam:  BP 159/61   Pulse (!) 57   Temp 98.1 F (36.7 C) (Oral)   Resp 15   Ht 1.499 m (4\' 11" )   Wt 72.7 kg (160 lb 3.2 oz)   SpO2 93%   BMI 32.36 kg/m      Intake/Output Summary (Last 24 hours) at 05/08/2023 0700  Last data filed at 05/07/2023 2100  Gross per 24 hour   Intake --   Output 325 ml   Net -325 ml     Wt Readings from Last 1 Encounters:   05/07/23 0530 72.7 kg (160 lb 3.2 oz)   05/06/23 0630 74.5 kg (164 lb 4.8 oz)   05/05/23 0637 76.1 kg (167 lb 12.8 oz)   05/04/23 0620 76 kg (167 lb 9.6 oz)   05/03/23 0638 76.5 kg (168 lb 11.2 oz)   05/02/23 1912 76.4 kg (168 lb 8 oz)   05/02/23 1511 77 kg (169 lb 12.1 oz)     General: awake, alert, oriented x 3, no acute distress.  Cardiovascular: regular rate and rhythm, no murmurs, rubs or gallops     Lungs: Bilateral bases with crackles, no wheezing or rhonchi  Abdomen: soft, non-tender, non-distended, normoactive bowel sounds  Extremities: no clubbing, cyanosis, or edema     Lab Review:   Recent Labs     05/06/23  0359   WBC 5.88   Hemoglobin 11.6   Hematocrit 34.9    Platelet Count 303     Recent Labs     05/07/23  0438 05/06/23  2313   hs Troponin 5.5 7.3    Recent Labs     05/08/23  0500 05/06/23  2316 05/06/23  0359   Sodium 130* 135 134*   Potassium 3.9 3.6 3.3*   CO2 26 23 25    BUN 14 12 8    Creatinine 0.7 0.8 0.7   Glucose 94 100 99   Magnesium 2.1 1.8  --     Estimated Creatinine Clearance: 58.4 mL/min (based on SCr of 0.7 mg/dL).   No results for input(s): "INR", "TSH", "CHOL", "TRIG", "HDL", "LDL" in the last 72 hours.     Telemetry: SR, HR 71 bpm     Imaging:  Radiology Results (24 Hour)       Procedure Component Value Units Date/Time    NM Myocardial Perfusion Spect (Stress And Rest) [[id number]] Collected: 05/07/23 1100    Order Status: Completed Updated: 05/07/23 1642     Stress LV  Ejection Fraction 75     Summary No arrhythmias. Normal stress and rest myocardial perfusion study without evidence of inducible ischemia or scar. TID ratio 1.05. Normal left ventricular function with calculated LVEF 75 %. No significant ST-T changes from baseline.    Narrative:      Patient Info  Name:     Becky Burton  Age:     77 years  DOB:     06-Jun-1946  Gender:     Female  MRN:     [id number]  Exam Date:     05/07/2023 11:00 AM    Exam Type:     NM MYOCARDIAL PERFUSION SPECT W STRESS AND REST    Any Known Allergies:     See allergy list in the electronic medical record.    Study Info  Indications      CHEST PAIN,  ACUTE,  NONSPECIFIC,  LOW PROB CAD -    Staff  Nuclear Technologist:     Johnston Ebbs  Ordering Provider:     Payton Doughty MD    Additional Nuclear Technologist:     Nash Dimmer CNMT    Supervising Provider:     Rosalyn Gess    Reading Physician:     Theodora Blow Alluri      Summary    1. Normal stress and rest myocardial perfusion study without evidence of  inducible ischemia or scar.    2. Normal left ventricular function with calculated LVEF 75 %.    3. TID ratio 1.05.    4. No significant ST-T changes from baseline.    5. No arrhythmias.      SPECT  Results  Technical Quality:     Good  Perfusion Findings  Stress SPECT tomographic images reveal homogeneous tracer uptake throughout  the myocardium. Rest images show mild basal inferior and basal inferolateral  perfusion defect, likely due to attenuation artifact.      Functional Findings    Gated wall motion study demonstrates normal left ventricular thickening and  wall motion with calculated ejection fraction 75 %.  Functional Results  ----------------------------------------------------------------------  Name                                 Value        Normal  ----------------------------------------------------------------------    Stress  ----------------------------------------------------------------------  Stress LV EF                          75 %          >=55      Stress ECG Details  Protocol:     Regadenoson  Total Time:     1 min : 0 sec  Rest HR:     54 bpm  Rest Sys BP:     151 mmHg  Rest Dias BP:     66 mmHg  Peak HR:     87 bpm  Peak Sys BP:     151 mmHg  Peak Dias BP:     66 mmHg  Max Pred HR:     143 bpm  % Max Pred HR:     61 %  Max RPP:     13,137 bpm*mmHg  BP Response:    Normal hemodynamic response to regadenoson.  Termination Reason:    Protocol completed.  Symptoms:    No significant symptoms.  Resting  ECG:    Sinus bradycardia with resting ST \\T \ T wave abnormality in inferolateral  leads.  Stress ECG:    No significant ST changes from baseline.  Arrhythmias:    No arrhythmias.    Administration Site:     IV - right hand      Image Protocol  Protocol:     Rest/Stress 1 Day  Rest  Radiopharmaceutical Dose:     10.0 mCi  Imaging Date \\T \ Time:     16109604               1100  Stress  Radiopharmaceutical Dose:     30.0 mCi  Imaging Date \\T \ Time:     [id number]               1200    Radiopharmaceutical:     Tc-30m Tetrofosmin    Administration Site:     IV - right hand    Administered By:     Johnston Ebbs    Radiopharmaceutical:     Tc-70m Tetrofosmin    Administration Site:     IV - right  hand    Administered By:     Johnston Ebbs      Report Signatures  MPI SPECT Finalized by Theodora Blow Alluri on 05/07/2023 04:42 PM  Stress Finalized by Theodora Blow Alluri on 05/07/2023 04:42 PM            Assessment/Plan:   Saumya Dugue is a 77 y.o. female with a history of HTN, HLD, GERD, and hypothyroidism who presented to the hospital on 05/02/2023 with chest discomfort and DOE. Patient reports that she originally went to [facility] for a rash.  She was found to be hypertensive with blood pressures in the 200s systolic.  She was then referred to [facility] for further evaluation.   She reports that she experienced left-sided chest heaviness, and difficulty breathing recently. She noticed she had occasional wheezing that she did not have previously.  She reports she has had difficulty sleeping due to her breathing, and was unable to lie comfortably. Patient reports she sleeps on 2 pillows at night.     In the ED, her NT-ProBNP was elevated 2,322. Chest CT revealed small bilateral pleural effusions and atelectasis. CT was negative for P.E. She has been diuresed and is now on PO Furosemide. Today, she denies any CP/heaviness, SOB, or orthopnea.     Chest discomfort/orthopnea: Elevated NT-proBNP on admission, chest CT with small bilateral pleural effusions. Has diuresed well. Nuclear stress test 7/3 negative for ischemia.   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  Hypomagnesia         Continue PO Lasix 40 mg daily   Replace K with goal >4   Restart Entresto at lower dose of 49-51mg  BID given symptomatic hypotension   Continue Jardiance 10 mg daily  Continue  doxazosin 1 mg daily   Continue atorvastatin  Telemetry monitoring   BP control with goal <140 mmHg for SBP  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     [facility] Cardiology   [facility] NP Spectralink [phone] or [phone] (M-F 8 am-5 pm)   [facility] Spectralink [phone]  (M-F 8 am-5 pm)  After Hours On-Call Physician: [phone]

## 2023-05-08 NOTE — Progress Notes (Signed)
PROGRESS NOTE     Date Time: 05/03/23 12:10 PM  Patient Name: Becky Burton, Becky Burton           Patient Active Problem List   Diagnosis    Dyspnea                  Assessment/ Plan:   Kahlilah Epp is a 77 y.o. female with  Acute congestive heart failure. Secondary to Grade  III diastolic dysfunction (restrictive pattern) with elevated left atrial pressure   Stress test unremarkable  Meds adjustment as per Cardiologist   Continue fluid restriction to 1500 cc/day.  Low-salt diet.  Stress test unremarkable   Pt on entresto 49/51  Jardiance 10 mg 1 daily  Cardura 1 mg daily  Lasix 40 mg 1 daily   Further plan as per cardiology  Most likley [place] today or tomorrow as per there recommendation     Hypokalemia resolved   Bradycardia ,  Off novobilol     Hypertension   Pt on entresto 49/51 mg BID   Jardiance 10 mg 1 daily  Cardura 1 mg daily  Lasix 40 mg 1 daily   Repeat BMP in 1 5 days     Hypothyroidism continue Synthroid 112 mcg once a day  TSH 0.93     Hypercholesterolemia continue Lipitor 10 mg once a day  Gastroesophageal reflux disease continue her Protonix and famotidine  Mild hyponatremia due to diuretics  Thrombocytopenia resolved      Anemia,of ch disease  Vitamin b12 def  Cont vitamin b12 inj for 5 doses   Vitamin b12 1000ug 1 daily as out pt   Obesity   DVT prophylaxis  SCD/TEDS bilateral LE  heparin 5000unit SQ BID            All labs/ notes from last 24 hrs reviewed  Will continue rest of the plan same  Care Plan  D/W nursing,consultants,case manager     This note was generated by the Epic EMR system/ Dragon speech recognition and may contain inherent errors or omissions not intended by the user. Grammatical errors, random word insertions, deletions, pronoun errors and incomplete sentences are occasional consequences of this technology due to software limitations. Not all errors are caught or corrected. If there are questions or concerns about the content of this note or information contained  within the body of this dictation they should be addressed directly with the author for clarification        Subjective:   Patient Seen and Examined. The notes from the last 24 hours were reviewed   Pt feels better no chest pressure, ,able tolay gflat, denies any chest pain or SOB        Medications:                  Current Facility-Administered Medications   Medication Dose Route Frequency    atorvastatin  10 mg Oral Daily    bisoprolol  10 mg Oral Daily    doxazosin  2 mg Oral QHS    famotidine  40 mg Oral Daily    furosemide  20 mg Intravenous Daily    heparin (porcine)  5,000 Units Subcutaneous Q12H [facility]    levothyroxine  112 mcg Oral Daily at 0600    pantoprazole  40 mg Oral QAM AC    timolol  1 drop Both Eyes Daily    valsartan  320 mg Oral Daily  Review of Systems:      A comprehensive review of systems was: obtained from nurse ,chart review and the patient   General ROS: negative for - fatigue , lightheadedness.or dizziness blurry vision  ENT ROS: negative for - headaches, nasal congestion or tinnitus, no sore throat, no ear ache   Gastrointestinal ROS: no abdominal pain,   no nausea vomiting  Neurological ROS: negative for - , headaches,  numbness/tingling   GU,nofrequency , urgency ,or burning micturation.                    Physical Exam:      Patient Vitals for the past 24 hrs:    BP Temp Temp src Pulse Resp SpO2 Height Weight   05/03/23 1130 150/67 98.1 F (36.7 C) Oral (!) 55 20 95 % -- --   05/03/23 0751 198/68 98.4 F (36.9 C) Oral (!) 56 18 95 % -- --   05/03/23 0638 -- -- -- -- -- -- -- 76.5 kg (168 lb 11.2 oz)   05/03/23 0347 172/75 98.2 F (36.8 C) Oral (!) 52 17 91 % -- --   05/03/23 0013 142/60 98.1 F (36.7 C) Oral (!) 50 18 94 % -- --   05/02/23 2127 180/69 -- -- (!) 57 -- -- -- --   05/02/23 2126 192/63 -- -- 60 -- -- -- --   05/02/23 1942 186/71 98.2 F (36.8 C) Oral (!) 59 18 94 % -- --   05/02/23 1912 -- -- -- -- -- -- -- 76.4 kg (168 lb 8 oz)   05/02/23 1835 -- -- --  -- -- -- 1.499 m (4\' 11" ) --   05/02/23 1825 187/74 97.5 F (36.4 C) Oral 62 -- 94 % -- --   05/02/23 1730 171/71 -- -- 60 18 95 % -- --   05/02/23 1700 161/79 98.6 F (37 C) Oral 60 20 96 % -- --   05/02/23 1511 170/78 98 F (36.7 C) Oral 92 20 98 % -- 77 kg (169 lb 12.1 oz)      Intake and Output Summary (Last 24 hours) at Date Time     Intake/Output Summary (Last 24 hours) at 05/03/2023 1210  Last data filed at 05/03/2023 0865        Gross per 24 hour   Intake --   Output 100 ml   Net -100 ml         General Appearance-Well appearing, no acute distress  HEENT- PERRLA, EOMI, Sclera clear. Oropharynx -normal   Neck-Supple, no masses or abnormal adenopathy. No JVD or bruits.  Lungs- Bilaterally symmetrical equal in expansion on auscultation bilateral  basal crackles   CV- RRR, normal S1, S2, no rubs/murmurs/gallops1 plus edemas  Abdomen-Not distended, soft non tender. +BS, No masses palpated. No HSM  Neuro -Alert oriented no focal motor sensory deficit  Muscloskeletal-No cyanosis, clubbing, or edema, periopheral pulsus positive  no joint tenderness, deformity or swelling  Skin no lesion warm and dry   Psych mood and affects are appropriate     Labs:              Recent Labs   Lab 05/03/23  0434 05/02/23  1540   WBC 5.22 6.16   RBC 3.39* 3.74*   Hemoglobin 9.8* 10.8*   Hematocrit 29.9* 32.5*   MCV 88.2 86.9   Platelet Count 245 115*                 Recent Labs  Lab 05/03/23  0434 05/02/23  1540   Sodium 135 134*   Potassium 4.0 3.9   Chloride 101 99   CO2 25 27   BUN 7 8   Creatinine 0.7 0.7   Glucose 85 108*   Calcium 8.5 9.0                Invalid input(s): "OSMOL"           Recent Labs   Lab 05/02/23  1540   ALT 9   AST (SGOT) 14   Bilirubin, Total 0.5   Albumin 3.4*   Alkaline Phosphatase 53               Recent Labs   Lab 05/02/23  1540   hs Troponin 4.1             Microbiology, reviewed and are significant for:  Microbiology Results (last 15 days)         Procedure Component Value Units Date/Time      COVID-19 and Influenza ([place]) (symptomatic) [[id number]]  (Normal) Collected: 05/02/23 1542     Order Status: Completed Specimen: Swab from Anterior Nares Updated: 05/02/23 1645       SARS-CoV-2 (COVID-19) RNA Not Detected       Influenza A RNA Not Detected       Influenza B RNA Not Detected     Narrative:       A result of "Detected" indicates POSITIVE for the presence of viral RNA  A result of "Not Detected" indicates NEGATIVE for the presence of viral RNA     Test performed using the Roche cobas [place] SARS-CoV-2 & Influenza A/B assay. This is a multiplex real-time RT-PCR assay for the detection of SARS-CoV-2, influenza A, and influenza B virus RNA. Viral nucleic acids may persist in vivo, independent of viability. Detection of viral nucleic acid does not imply the presence of infectious virus, or that virus nucleic acid is the cause of clinical symptoms. Negative results do not preclude SARS-CoV-2, influenza A, and/or influenza B infection and should not be used as the sole basis for diagnosis, treatment or other patient management decisions. Invalid results may be due to inhibiting substances in the specimen and recollection should occur.                         No results for input(s): "CXBLD" in the last 24 hours.  Invalid input(s): "CXURN"     Recent BLOOD CULTURE No results for input(s): "CXBLD" in the last 24 hours.  Recent URINE CULTURE Invalid input(s): "CXURN"     Cardiac Enzymes         Recent Labs   Lab 05/02/23  1540   hs Troponin 4.1         No results found for: "BNP"         Thyroid Studies              No results for input(s): "CXBLD" in the last 24 hours.  Invalid input(s): "CXURN"     Urinalysis          Invalid input(s): "LEUKOCYTESUR"               MICROBIOLOGY  No results for input(s): "CXBLD" in the last 24 hours.  Invalid input(s): "CXURN"              Rads:   Radiological Procedure reviewed.  Radiology Results (24 Hour)  Procedure Component Value Units Date/Time     CT Angio Chest (PE  study) [[id number]] Collected: 05/02/23 1704     Order Status: Completed Updated: 05/02/23 1709     Narrative:       HISTORY: Chest pain     COMPARISON: None.     TECHNIQUE:  CT angiogram of the chest performed with intravenous contrast.  Multiplanar reformatted and 3D maximum intensity projection (MIP) images  were created and reviewed. The following dose reduction techniques were  utilized: automated exposure control and/or adjustment of the mA and/or KV  according to patient size, and the use of an iterative reconstruction  technique.     CONTRAST: iohexol (OMNIPAQUE) 350 MG/ML injection 80 mL     FINDINGS:  No detectable pulmonary embolism. 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 Coronary artery calcification.  Aortic valvular calcification. Biatrial dilatation.     Scattered mediastinal and hilar lymph nodes are subcentimeter in short axis  and likely reactive. Small bilateral pleural effusions and atelectasis.  Scarring within the lung bases. The lungs are otherwise clear. No  suspicious pulmonary nodule.     Peripherally calcified lesion within the spleen is statistically benign.  Degenerative change thoracic spine.        Impression:          1. No detectable pulmonary embolism.  2. Small bilateral pleural effusions and atelectasis.     Launa Flight, MD  05/02/2023 5:07 PM     Chest AP Portable [[id number]] Collected: 05/02/23 1603     Order Status: Completed Updated: 05/02/23 1607     Narrative:       HISTORY: Shortness of breath      COMPARISON: 03/28/2023     FINDINGS:   Small left pleural effusion. No new consolidation. No pneumothorax.  Unchanged mediastinal silhouette. No acute osseous abnormality.  Redemonstrated round calcification projecting over the left upper quadrant.           Impression:          Small left pleural effusion.        August Albino  05/02/2023 4:05 PM                Signed by: Lurline Idol, MD

## 2023-05-08 NOTE — Progress Notes (Signed)
05/08/23 1436   Discharge Disposition   Physical Discharge Disposition Home   CM Interventions   Multidisciplinary rounds/family meeting before d/c? No   Medicare Checklist   Is this a Medicare patient? Yes   3 midnight inpatient qualifying stay (SNF only) Yes   If LOS 3 days or greater, did patient received 2nd IMM Letter? No  (left hospital before Case Manager able to visit patient today.)       Dwana Curd, [place], ACM-SW  Social Worker Care Manager II  L+D, Postpartum and NICU  [facility]  Phone: [phone]  Main CM Office: [phone]  [email or web address]

## 2023-05-08 NOTE — Progress Notes (Signed)
Discharge instructions provided by resource nurse.

## 2023-05-08 NOTE — Plan of Care (Signed)
Problem: Hemodynamic Status: Cardiac  Goal: Stable vital signs and fluid balance  Outcome: Progressing  Flowsheets (Taken 05/08/2023 1237)  Stable vital signs and fluid balance:   Assess signs and symptoms associated with cardiac rhythm changes   Monitor lab values     Problem: Moderate/High Fall Risk Score >5  Goal: Patient will remain free of falls  Outcome: Progressing  Flowsheets (Taken 05/08/2023 1237)  VH High Risk (Greater than 13): BED ALARM WILL BE ACTIVATED WHEN THE PATEINT IS IN BED WITH SIGNAGE "RESET BED ALARM"     Problem: Day of Discharge - Heart Failure  Goal: Discharge Education  Outcome: Progressing  Flowsheets (Taken 05/08/2023 1237)  Day of Discharge:   After Visit Summary (Discharge Instuctions) with medications   CHF Warning Zones and when to call for help   Fluid Restriction   Follow-up appointments   Daily Standing Weights   Low Sodium Diet

## 2023-05-08 NOTE — Progress Notes (Incomplete)
Discharge orders received. After visit summary [place] paperwork printed and given to pt at bedside. Prescriptions sent to patient preferred pharmacy. Belongings gathered and brought back to patient. IV and tele box removed and returned. Educated patient on S&S to watch out for and who to notify, medication instructions and follow up appointments.    Core measure education w/ [facility] Heart failure booklet.    Patient has no questions or concerns at this time. Patient [place] from tele unit at *** via wheelchair with transport with belongings in hand.     No acute changes at this time.

## 2023-05-08 NOTE — Discharge Summary (Signed)
DISCHARGE NOTE    Date Time: 05/08/23 1:19 PM  Patient Name: Becky Burton  Attending Physician: Lurline Idol, MD    Date of Admission:   05/02/2023    Date of Discharge:   05/08/2023    Reason for Admission:   Dyspnea [R06.00]  Pleural effusion [J90]  Elevated brain natriuretic peptide (BNP) level [R79.89]  Acute on chronic congestive heart failure, unspecified heart failure type [I50.9]    Problems:   Lists the present on admission hospital problems  Present on Admission:   Dyspnea   Vitamin B12 deficiency (dietary) anemia   Acute on chronic diastolic heart failure   Anemia of chronic disease   HTN (hypertension)   Hypercholesterolemia   Hypothyroidism   GERD (gastroesophageal reflux disease)   Bradycardia   Hyponatremia      Hospital Problems:  Principal Problem:    Dyspnea  Active Problems:    Vitamin B12 deficiency (dietary) anemia    Acute on chronic diastolic heart failure    Anemia of chronic disease    HTN (hypertension)    Hypercholesterolemia    Hypothyroidism    GERD (gastroesophageal reflux disease)    Bradycardia    Hyponatremia      Problem Lists:  Patient Active Problem List   Diagnosis    Dyspnea    Vitamin B12 deficiency (dietary) anemia    Acute on chronic diastolic heart failure    Anemia of chronic disease    HTN (hypertension)    Hypercholesterolemia    Class 1 obesity due to excess calories with body mass index (BMI) of 32.0 to 32.9 in adult    Hypothyroidism    GERD (gastroesophageal reflux disease)    Bradycardia    Hyponatremia           Discharge Dx:   Principal Problem:    Dyspnea  Active Problems:    Vitamin B12 deficiency (dietary) anemia    Acute on chronic diastolic heart failure    Anemia of chronic disease    HTN (hypertension)    Hypercholesterolemia    Hypothyroidism    GERD (gastroesophageal reflux disease)    Bradycardia    Hyponatremia      Consultations:     Treatment Team:   Mykalah Saari, Esmond Camper, MD  Craig Staggers, MD  Willow Ora, RN  Zaeeter, Reuben Likes, MD  Dingess,  Beverly Gust, FNP  Alluri, Theodora Blow, MD  Theodosia Quay, Townsend Roger  Palzom, Tsering, RN  Dwana Curd      Procedures performed:               Labs:     Recent Labs   Lab 05/06/23  0359 05/05/23  0421 05/04/23  0441   WBC 5.88 6.07 6.55   Hemoglobin 11.6 10.3* 10.1*   Hematocrit 34.9 31.0* 30.2*   MCV 85.5 86.6 86.3   MCHC 33.2 33.2 33.4   RDW 14 14 14    MPV 10.5 11.0 10.7   Platelet Count 303 259 245         Recent Labs   Lab 05/08/23  0500 05/06/23  2316 05/06/23  0359 05/05/23  0422 05/04/23  0441   Sodium 130* 135 134* 134* 135   Potassium 3.9 3.6 3.3* 3.8 3.6   Chloride 98* 101 100 100 102   CO2 26 23 25 26 25    BUN 14 12 8 10 8    Creatinine 0.7 0.8 0.7 0.6 0.6   Glucose 94 100 99 89 90  Calcium 8.8 9.0 8.9 8.6 8.8     Recent Labs   Lab 05/06/23  0359   Bilirubin, Total 0.4   Protein, Total 6.1   Albumin 3.4*   ALT 9   AST (SGOT) 15                           Rads:     Radiology Results (24 Hour)       Procedure Component Value Units Date/Time    NM Myocardial Perfusion Spect (Stress And Rest) [[id number]] Collected: 05/07/23 1100    Order Status: Completed Updated: 05/07/23 1642     Stress LV Ejection Fraction 75     Summary No arrhythmias. Normal stress and rest myocardial perfusion study without evidence of inducible ischemia or scar. TID ratio 1.05. Normal left ventricular function with calculated LVEF 75 %. No significant ST-T changes from baseline.    Narrative:      Patient Info  Name:     Khalil Brumleve  Age:     21 years  DOB:     [birth year]  Gender:     Female  MRN:     [id number]  Exam Date:     05/07/2023 11:00 AM    Exam Type:     NM MYOCARDIAL PERFUSION SPECT W STRESS AND REST    Any Known Allergies:     See allergy list in the electronic medical record.    Study Info  Indications      CHEST PAIN,  ACUTE,  NONSPECIFIC,  LOW PROB CAD -    Staff  Nuclear Technologist:     Johnston Ebbs  Ordering Provider:     Payton Doughty MD    Additional Nuclear Technologist:     Nash Dimmer  CNMT    Supervising Provider:     Rosalyn Gess    Reading Physician:     Theodora Blow Alluri      Summary    1. Normal stress and rest myocardial perfusion study without evidence of  inducible ischemia or scar.    2. Normal left ventricular function with calculated LVEF 75 %.    3. TID ratio 1.05.    4. No significant ST-T changes from baseline.    5. No arrhythmias.      SPECT Results  Technical Quality:     Good  Perfusion Findings  Stress SPECT tomographic images reveal homogeneous tracer uptake throughout  the myocardium. Rest images show mild basal inferior and basal inferolateral  perfusion defect, likely due to attenuation artifact.      Functional Findings    Gated wall motion study demonstrates normal left ventricular thickening and  wall motion with calculated ejection fraction 75 %.  Functional Results  ----------------------------------------------------------------------  Name                                 Value        Normal  ----------------------------------------------------------------------    Stress  ----------------------------------------------------------------------  Stress LV EF                          75 %          >=55      Stress ECG Details  Protocol:     Regadenoson  Total Time:     1 min : 0 sec  Rest HR:     54 bpm  Rest Sys BP:     151 mmHg  Rest Dias BP:     66 mmHg  Peak HR:     87 bpm  Peak Sys BP:     151 mmHg  Peak Dias BP:     66 mmHg  Max Pred HR:     143 bpm  % Max Pred HR:     61 %  Max RPP:     13,137 bpm*mmHg  BP Response:    Normal hemodynamic response to regadenoson.  Termination Reason:    Protocol completed.  Symptoms:    No significant symptoms.  Resting ECG:    Sinus bradycardia with resting ST \\T \ T wave abnormality in inferolateral  leads.  Stress ECG:    No significant ST changes from baseline.  Arrhythmias:    No arrhythmias.    Administration Site:     IV - right hand      Image Protocol  Protocol:     Rest/Stress 1 Day  Rest  Radiopharmaceutical Dose:      10.0 mCi  Imaging Date \\T \ Time:     62703500               1100  Stress  Radiopharmaceutical Dose:     30.0 mCi  Imaging Date \\T \ Time:     93818299               1200    Radiopharmaceutical:     Tc-67m Tetrofosmin    Administration Site:     IV - right hand    Administered By:     Johnston Ebbs    Radiopharmaceutical:     Tc-3m Tetrofosmin    Administration Site:     IV - right hand    Administered By:     Johnston Ebbs      Report Signatures  MPI SPECT Finalized by Theodora Blow Alluri on 05/07/2023 04:42 PM  Stress Finalized by Theodora Blow Alluri on 05/07/2023 04:42 PM                    Hospital Course:     Kassydi Zaremba is a 77 y.o. female with H/O  HTN, hypothy hypothyroidism, hypercholesterolemia, came to the office complaining of chest pressure shortness of breath.  Patient had chest pressure last week also was advised to do the EKG or go to the ER at that time patient refused at.  Today patient came to the office complaining that she could shortness of breath worse with exertion.  She cannot lay down flat.  When she lays down she feels there is a pressure on her chest.  She has some cough  She might have a pneumonia.  Feet.  No fever or chills.  Patient had the symptoms for last 1 week.  EKG was done in the office which was unremarkable and clinical examination as patient was in CHF patient was advised to go to the emergency room.  In the ER patient was found to be in CHF therefore she is admitted to hospital for further evaluation and management due to    Acute congestive heart failure. Secondary to Grade  III diastolic dysfunction (restrictive pattern) with elevated left atrial pressure   Pt was admitted on telemetry,cardilogy was consulted,pt had echo which revealed Grade  III diastolic dysfunction (restrictive pattern) with elevated left atrial pressure . Stress test was done which was  unremarkable  Continue fluid restriction to 1500 cc/day.  Low-salt diet.  Pt was started on      entresto 49/51mg   BID   Jardiance 10 mg 1 daily  Cardura 1 mg daily  Lasix 40 mg 1 daily   Pt SOB has improves,abletolay flat,BPbetter controlledasclinically stable will [place] with office fu and BP meds adjustment as out PT    Hypokalemia resolved     Bradycardia ,  Novobilol was [place] due tobradycardia     Hypertension watch closely as out pt    Pt on entresto 49/51 mg BID   Jardiance 10 mg 1 daily  Cardura 1 mg daily  Lasix 40 mg 1 daily   Repeat BMP in 1 5 days     Hypothyroidism continue Synthroid 112 mcg once a day  TSH 0.93     Hypercholesterolemia continue Lipitor 10 mg once a day  Gastroesophageal reflux disease continue her Protonix and famotidine  Mild hyponatremia due to diuretics  Thrombocytopenia resolved      Anemia,of ch disease  Vitamin b12 def  As her level was in 200 she was given   vitamin b12 inj for 5 doses   Vitamin b12 1000ug 1 daily as out pt   Obesity     As pt clinically stable will [facility] with Office FU  Admission Condition: Unstable  Discharge Condition: Stable          This note was generated by the Epic EMR system/ Dragon speech recognition and may contain inherent errors or omissions not intended by the user. Grammatical errors, random word insertions, deletions, pronoun errors and incomplete sentences are occasional consequences of this technology due to software limitations. Not all errors are caught or corrected. If there are questions or concerns about the content of this note or information contained within the body of this dictation they should be addressed directly with the author for clarification          Discharge Medications:        Medication List        START taking these medications      cyanocobalamin 1000 MCG tablet  Take 1 tablet (1,000 mcg) by mouth daily     empagliflozin 10 MG tablet  Commonly known as: JARDIANCE  Take 1 tablet (10 mg) by mouth daily  Start taking on: May 09, 2023     furosemide 40 MG tablet  Commonly known as: LASIX  Take 1 tablet (40 mg) by mouth daily  Start taking on:  May 09, 2023     sacubitril-valsartan 49-51 MG Tabs per tablet  Commonly known as: ENTRESTO  Take 1 tablet by mouth 2 (two) times daily            CHANGE how you take these medications      doxazosin 1 MG tablet  Commonly known as: CARDURA  Take 1 tablet (1 mg) by mouth nightly  What changed:   medication strength  how much to take            CONTINUE taking these medications      CRANBERRY EXTRACT PO     famotidine 40 MG tablet  Commonly known as: PEPCID     FISH OIL CONCENTRATE PO     FLAXSEED OIL PO     levothyroxine 112 MCG tablet  Commonly known as: SYNTHROID     lovastatin 40 MG tablet  Commonly known as: MEVACOR     omeprazole 40 MG capsule  Commonly known as:  PriLOSEC     timolol 0.5 % ophthalmic solution  Commonly known as: TIMOPTIC     VITAMIN D (CHOLECALCIFEROL) PO            STOP taking these medications      hydrALAZINE 25 MG tablet  Commonly known as: APRESOLINE     hydrocortisone 2.5 % cream     nebivolol 5 MG tablet  Commonly known as: BYSTOLIC     valsartan 320 MG tablet  Commonly known as: DIOVAN               Where to Get Your Medications        These medications were sent to [id number] - [place], [place] - [address] AT [facility] [address]  [address], [place] [address]      Phone: [phone]   doxazosin 1 MG tablet  empagliflozin 10 MG tablet  furosemide 40 MG tablet  sacubitril-valsartan 49-51 MG Tabs per tablet       Information about where to get these medications is not yet available    Ask your nurse or doctor about these medications  cyanocobalamin 1000 MCG tablet         Please see med rec alsoif any medicine changed after this note  Discharge Instructions:   Diet Regular  Activity as Tolerated          Patient was instructed to follow up with DR Joycelyn Man 2 days, or primary care doctor  Consultants       Minutes spent coordinating discharge and reviewing discharge plan >32 minutes        Signed by: Lurline Idol, MD, MD

## 2023-05-09 LAB — URINE IMMUNOFIXATION ELECTROPHORESIS WITH REVIEW

## 2023-05-09 LAB — SERUM IMMUNOFIXATION ELECTROPHORESIS WITH REVIEW

## 2023-05-09 NOTE — Progress Notes (Signed)
Ambulatory Care Management   Ambulatory Case Manager contacted patient in response to an automated discharge phone call. Please see call outreach encounter attached.    Hospital patient admitted to: [facility] Admission/Discharge Dates:   6/28-7/4     Reason for admission to hospital: CHF    Name: Becky Burton    ### Patient Details  Date of Birth: 29-May-1946  MRN: [id number]    ### Encounter Details  Arrival Date: 05/02/2023 03:18 PM EDT  Discharge Date: 05/08/2023 02:33 PM EDT  Encounter ID: [id number] TCM7/02/2023   2:33:00PM    ### Related interaction  [place] - TCM V2 Post Discharge Outreach (Post Discharge TCM V2 Outreach 1) ([email or web address])    ### Questions     Question 1   Rx Obtained   Have you been able to get your prescribed medications? Please press 1 for yes, press 2 for no, or press 3 if you were not prescribed any medications.   Does Not Have Rx (Issue Panel: Post Discharge - TCM)    ### Required Interventions and Feedback     Call Status         Comments::     Writer reached patient. Writer introduced self and role. Pt verified identity-name and dob. (edited by DJ on 05/09/2023 03:47 PM EDT)     Obtaining Prescriptions         Obtaining Prescriptions - Issues:     Other (Add Details in Comments) (edited by DJ on 05/09/2023 03:54 PM EDT)    Comments:     Unable to obtain Jardiance (edited by DJ on 05/09/2023 03:54 PM EDT)     Obtaining Rx - Action List         Comments:     Writer reached out to preferred [facility] (verified with pt) at [phone] and spoke with [place]. Pt name and dob verified. [place]uo report prior auth needed for medication.    Writer paged Dr. Fawn Kirk to inform of pt need. Provider reached out to Clinical research associate. Provider informed Clinical research associate that patient can obtain sample from office until approval came. Provider to send prior auth to pharmacy. Writer informed patient to go obtain sample from PCP office. Patient verbalized  understanding. (edited by DJ on 05/09/2023 03:56 PM EDT)     Follow-Up Appointment  Help - Actions Taken         No Interventions Needed:     Yes (edited by DJ on 05/09/2023 03:57 PM EDT)    Comments:     Cardio appt 7/12  PCP appt 7/8 (edited by DJ on 05/09/2023 03:57 PM EDT)     Supplies/DME - Actions Taken         No Interventions Needed:     Yes (edited by DJ on 05/09/2023 03:57 PM EDT)     General Status - Actions Taken         Reviewed what to do if problem arises:     Yes (edited by DJ on 05/09/2023 03:48 PM EDT)    No Interventions Needed:     Yes (edited by DJ on 05/09/2023 03:48 PM EDT)    Comments:     Diagnosis: CHF  [place] CHF initiative    Pt reports she is well. Denies any active CHF red flags. Writer reviewed red flags and when to alert provider.    Pt confirms adherence to fluid restriction and low salt diet.    Pt reports she did not weigh today but will resume tomorrow.  Denies any SOB/dyspnea/edema.   (edited by DJ on 05/09/2023 03:57 PM EDT)     Home Health - Actions Taken         No Interventions Needed:     Yes (edited by DJ on 05/09/2023 03:57 PM EDT)     Discharge Instruction - Actions Taken         Reviewed Discharge Instructions:     Yes (edited by DJ on 05/09/2023 03:54 PM EDT)    No Interventions Needed:     Yes (edited by DJ on 05/09/2023 03:54 PM EDT)    Comments:     Discharge instructions reviewed with patient. (edited by DJ on 05/09/2023 03:54 PM EDT)     Additional Questions         Comments:     No further ACM needs. (edited by DJ on 05/09/2023 03:57 PM EDT)     Medication Questions - Actions List         Answered Medication Questions:     Yes (edited by DJ on 05/09/2023 03:57 PM EDT)    Discussed Purpose of Medications:     Yes (edited by DJ on 05/09/2023 03:57 PM EDT)    Comments::     New medications and med changes reviewed with patient. (edited by DJ on 05/09/2023 03:57 PM EDT)    Simmie Davies, MSN RN  Pronouns She/Her  Ambulatory Care Management    [facility]  [address]  [place], [place] [address]  [place] [phone]  [email or web address]

## 2023-05-11 LAB — LIPOPROTEIN A (LPA): Lipoprotein (a): 19 nmol/L (ref <75)

## 2023-05-12 LAB — FREE KAPPA AND LAMBDA LIGHT CHAINS WITH RATIO, QUANTITATIVE
Kappa Free Light Chain: 2.79 mg/dL — ABNORMAL HIGH (ref 0.3300–1.94)
Kappa Lambda FLC Ratio: 1.74 — ABNORMAL HIGH (ref 0.2600–1.65)
Lambda Free Light Chain: 1.6 mg/dL (ref 0.5700–2.63)

## 2023-05-16 ENCOUNTER — Encounter (INDEPENDENT_AMBULATORY_CARE_PROVIDER_SITE_OTHER): Payer: Self-pay | Admitting: Cardiovascular Disease

## 2023-05-16 ENCOUNTER — Ambulatory Visit (INDEPENDENT_AMBULATORY_CARE_PROVIDER_SITE_OTHER): Payer: Medicare Other | Admitting: Cardiovascular Disease

## 2023-05-16 VITALS — BP 119/73 | HR 67 | Ht <= 58 in | Wt 157.0 lb

## 2023-05-16 DIAGNOSIS — I5033 Acute on chronic diastolic (congestive) heart failure: Secondary | ICD-10-CM

## 2023-05-16 DIAGNOSIS — I10 Essential (primary) hypertension: Secondary | ICD-10-CM

## 2023-05-16 NOTE — Progress Notes (Signed)
[facility] [place] OFFICE VISIT    CHIEF COMPLAINT: Hospital follow up     HPI: :  I had the pleasure of seeing Becky Burton today for cardiovascular follow up. She is a pleasant 77 y.o. female with a history of HTN, HLD, GERD, hypothyroidism, recent admission for acute on chronic HFpEF.     She was admitted to [facility] from 6/28 to 7/4 for chest pain, dyspnea on exertion in the context of hypertensive emergency. She was found to be in acute HFpEF with TTE demonstrating grade III diastolic dysfunction. Improved with IV diuresis. Of note, she did have an episode of symptomatic hypotension the day prior to discharge. Nuclear stress test showed normal myocardial perfusion.    Since discharge, she has been monitoring her blood pressure, fluctuates but has been overall well controlled. She has chronic leg edema.     MEDICATIONS:   Current Outpatient Medications:     CRANBERRY EXTRACT PO, Take 1 capsule by mouth daily, Disp: , Rfl:     cyanocobalamin 1000 MCG tablet, Take 1 tablet (1,000 mcg) by mouth daily, Disp: 100 tablet, Rfl: 1    doxazosin (CARDURA) 1 MG tablet, Take 1 tablet (1 mg) by mouth nightly, Disp: 100 tablet, Rfl: 0    empagliflozin (JARDIANCE) 10 MG tablet, Take 1 tablet (10 mg) by mouth daily, Disp: 100 tablet, Rfl: 1    famotidine (PEPCID) 40 MG tablet, Take 1 tablet (40 mg) by mouth daily, Disp: , Rfl:     Flaxseed, Linseed, (FLAXSEED OIL PO), Take 1 capsule by mouth daily, Disp: , Rfl:     furosemide (LASIX) 40 MG tablet, Take 1 tablet (40 mg) by mouth daily, Disp: 30 tablet, Rfl: 0    levothyroxine (SYNTHROID) 112 MCG tablet, Take by mouth Once a day at 6:00am, Disp: , Rfl:     lovastatin (MEVACOR) 40 MG tablet, Take 1 tablet (40 mg) by mouth nightly, Disp: , Rfl:     Omega-3 Fatty Acids (FISH OIL CONCENTRATE PO), Take 1 capsule by mouth daily, Disp: , Rfl:     omeprazole (PriLOSEC) 40 MG capsule, Take 1 capsule (40 mg) by mouth daily, Disp: , Rfl:     sacubitril-valsartan (ENTRESTO) 49-51 MG Tab per  tablet, Take 1 tablet by mouth 2 (two) times daily, Disp: 60 tablet, Rfl: 1    timolol (TIMOPTIC) 0.5 % ophthalmic solution, Place 1 drop into the left eye daily, Disp: , Rfl:     VITAMIN D, CHOLECALCIFEROL, PO, Take 1 capsule by mouth daily, Disp: , Rfl:      REVIEW OF SYSTEMS: All other systems reviewed and negative except as above.    PHYSICAL EXAMINATION  Health Related Quality of Life: Good   General Appearance: A well-appearing female in no acute distress.   Vital Signs: BP 119/73 (BP Site: Right arm, Patient Position: Sitting, Cuff Size: Medium)   Pulse 67   Ht 1.473 m (4\' 10" )   Wt 71.2 kg (157 lb)   SpO2 95%   BMI 32.81 kg/m    Chest: Clear to auscultation bilaterally  Cardiovascular: No murmurs or gallops.   Abdomen: Soft, nontender. No pulsatile masses or bruits.    Extremities: 1+ nonpitting edema in BLE    ECG:   05/06/2023 - sinus rhythm, nonspecific ST abnormality     LABS:   Lab Results   Component Value Date    WBC 5.88 05/06/2023    HGB 11.6 05/06/2023    HCT 34.9 05/06/2023    PLT  303 05/06/2023    NA 130 (L) 05/08/2023    K 3.9 05/08/2023    MG 2.1 05/08/2023    BUN 14 05/08/2023    CREAT 0.7 05/08/2023    GLU 94 05/08/2023    CHOL 146 05/04/2023    TRIG 57 05/04/2023    HDL 70 05/04/2023    LDL 65 05/04/2023    AST 15 05/06/2023    ALT 9 05/06/2023    TSH 0.93 05/04/2023        ECHOCARDIOGRAM:  05/03/2023 - Mildly dilated LV with ejection fraction of 60-65%. Grade III diastolic dysfunction. Normal RV size and function. Moderately dilated LA. RVSP 51 mmHg. Trivial effusion.     STRESS TEST:  Pharmacologic stress test (05/07/2023)     1. Normal stress and rest myocardial perfusion study without evidence of  inducible ischemia or scar.    2. Normal left ventricular function with calculated LVEF 75 %.    3. TID ratio 1.05.    4. No significant ST-T changes from baseline.    5. No arrhythmias.       IMPRESSION/RECOMMENDATIONS:  Chronic HFpEF with recent admission for HF exacerbation - stable on  current regimen. Recent ischemic evaluation with nuclear stress test was normal.   - Continue Entresto 49-51mg  BID, Jardiance 10mg  daily  - Continue lasix 40mg  daily   - Will follow up with home cardiologist in [facility] on return     2. HTN - BP well controlled   - Continue Entresto, Jardiance, and doxasozin       Thank you for involving me in Becky Burton's care.     Rosalyn Gess, MD    [facility] [place]  [address]  [address]  [place], [place] [address]  Phone - [phone]  Fax - [phone]

## 2023-05-27 ENCOUNTER — Other Ambulatory Visit (INDEPENDENT_AMBULATORY_CARE_PROVIDER_SITE_OTHER): Payer: Self-pay | Admitting: Cardiovascular Disease

## 2023-05-27 ENCOUNTER — Telehealth (INDEPENDENT_AMBULATORY_CARE_PROVIDER_SITE_OTHER): Payer: Self-pay

## 2023-05-27 DIAGNOSIS — I5033 Acute on chronic diastolic (congestive) heart failure: Secondary | ICD-10-CM

## 2023-05-27 MED ORDER — EMPAGLIFLOZIN 10 MG PO TABS
10.0000 mg | ORAL_TABLET | Freq: Every day | ORAL | 3 refills | Status: AC
Start: 2023-05-27 — End: ?

## 2023-05-27 NOTE — Telephone Encounter (Signed)
Name, strength, directions of requested refill(s):    Jardiance 10 mg    **Patient is reporting this medication will need to be preauthorized with her insurance (she is would like to know if we have any samples of this medication to give her for the meantime).      [id number] [place], [place] - [address]  [address]  [place] [address]  Phone: [phone] Fax: [phone]    In addition, she would like to be able to sign a release form in our office so her records can be shared with her cardiologist in [place]:    Dr. Marcie Mowers  Phone: [phone]    Patient Preferred Callback Number: [phone]        Next visit: Visit date not found

## 2023-05-27 NOTE — Telephone Encounter (Signed)
She would like to be able to sign a release form in our office so her records can be shared with her cardiologist in [place]:     Becky Burton can you please assist with this?  Thank you

## 2023-05-27 NOTE — Telephone Encounter (Signed)
Prescription Refill Checklist    Completed Process Reviewed Notes   [x]  Verified med on pt chart.    [x]  2.  Reviewed patient allergies    [x]  3.  Review last encounters since OV 05/16/23   [x]  4.  Determine if pt has f/u as instructed or needs OV    [x]  5.  If OV required, pend 30 day supply of medication    [x]  6.  Review labs are up to date. July 2024   []  7. Filled by RN 90 day x 0 May only be done 1 x yearly   DATE:         Reviewed em

## 2023-06-03 ENCOUNTER — Telehealth (INDEPENDENT_AMBULATORY_CARE_PROVIDER_SITE_OTHER): Payer: Self-pay | Admitting: Cardiovascular Disease

## 2023-06-03 NOTE — Telephone Encounter (Signed)
Patient filled out Release of Information form and asked that records be faxed to Dr. Marcie Mowers, Fax  [phone]. Records were faxed on 06-02-23.

## 2023-06-10 ENCOUNTER — Encounter (INDEPENDENT_AMBULATORY_CARE_PROVIDER_SITE_OTHER): Payer: Self-pay | Admitting: Cardiovascular Disease

## 2023-09-03 IMAGING — CT CT CHEST WITH CONTRAST
2 of 4 series · 14 of 36 positions shown, 17 images · IV contrast (APPLIED)
Comparison: Chest x-ray June 21, 2022.

________________________________________________________________________________________________ 
CT CHEST WITH CONTRAST, 09/03/2023 [DATE]: 
CLINICAL INDICATION: Follow-up from previous chest x-ray. No current chest 
complaints. Right paratracheal soft tissue density. 
A search for DICOM formatted images was conducted for prior CT imaging studies 
completed at a non-affiliated media free facility.
TECHNIQUE: The chest was scanned from base of neck through the lung bases with 
50 mL of Isovue 300 MDV injected intravenously on a high resolution low dose CT 
scanner. Routine MPR and MIP reconstruction images were performed. The patients 
eGFR was calculated to be 60.6 mL/min/1.73 m2 using the i-STAT device.

[Series 4: chest with 2.0 i31s 3 · axial · 0.71mm/px · z∈[-259,+3]mm · 11 of 145 slices shown, 14 images]
[im 7/145  mediastinal]
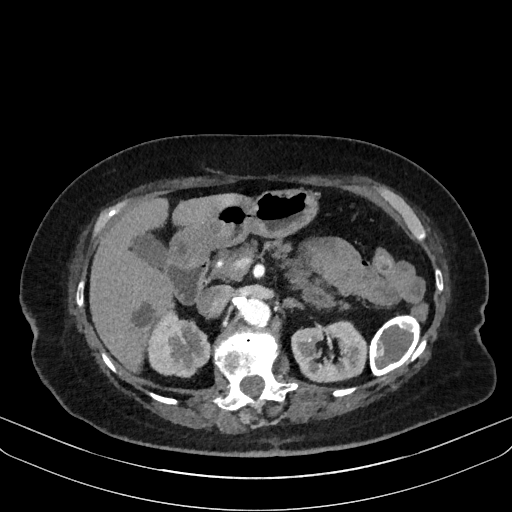
[im 7/145  lung]
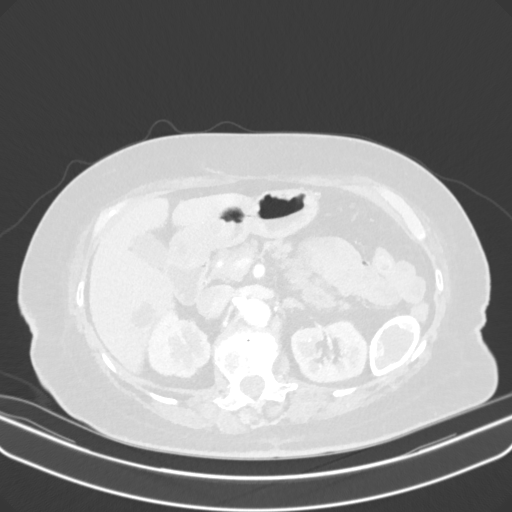
[im 21/145  lung]
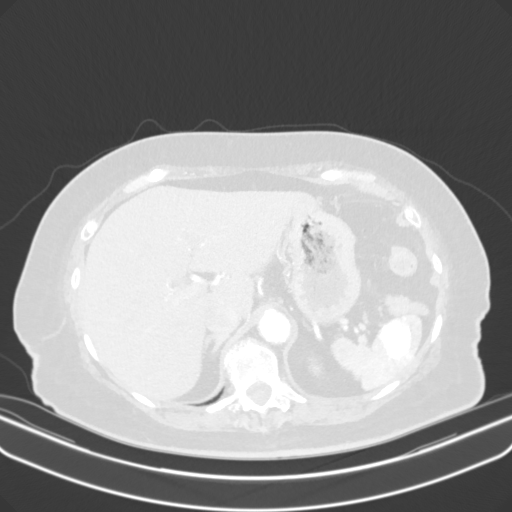
[im 35/145  lung]
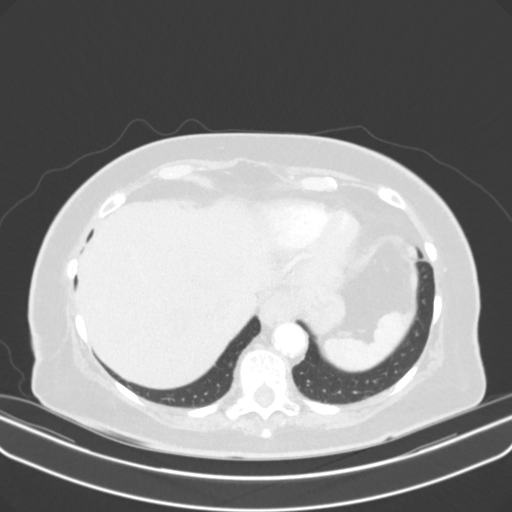
[im 49/145  lung]
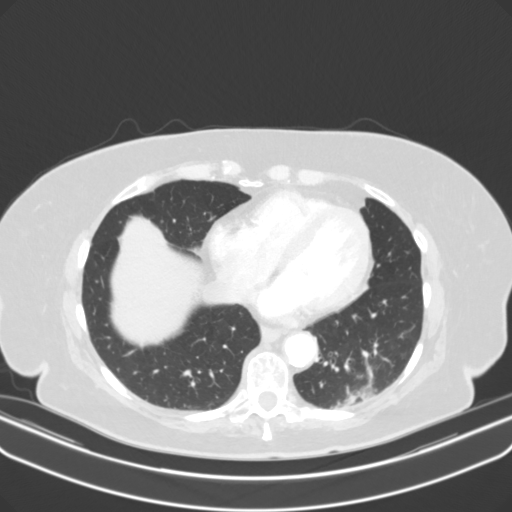
[im 62/145  mediastinal]
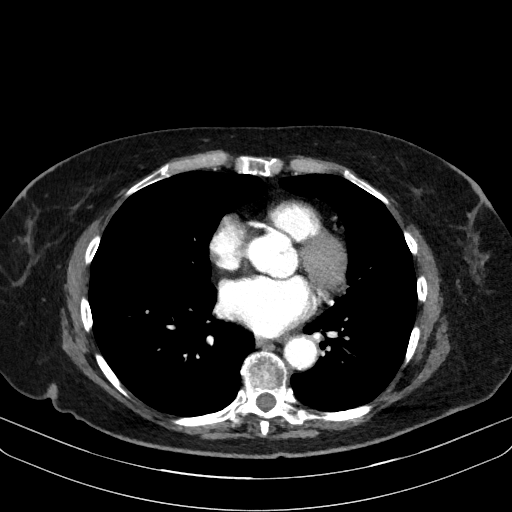
[im 62/145  lung]
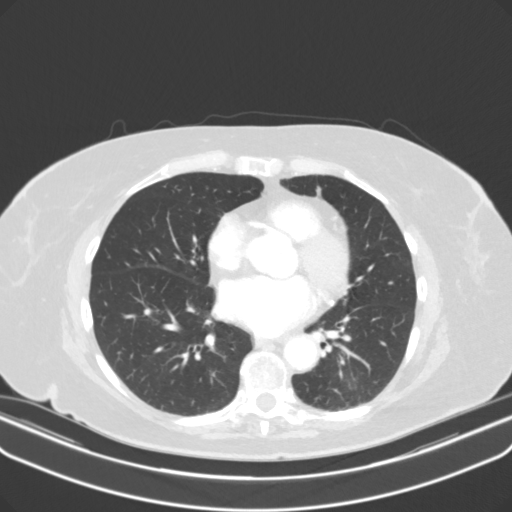
[im 76/145  lung]
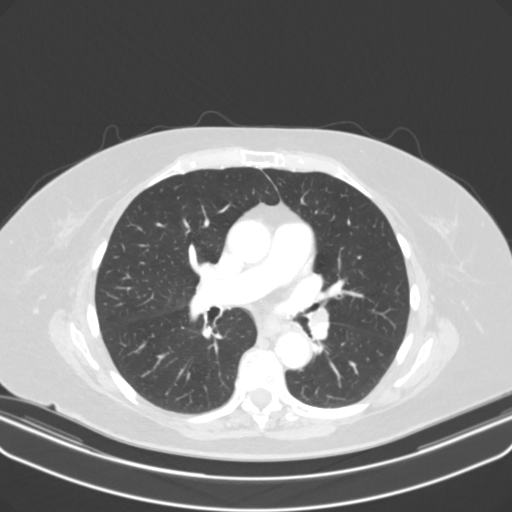
[im 83/145  lung]
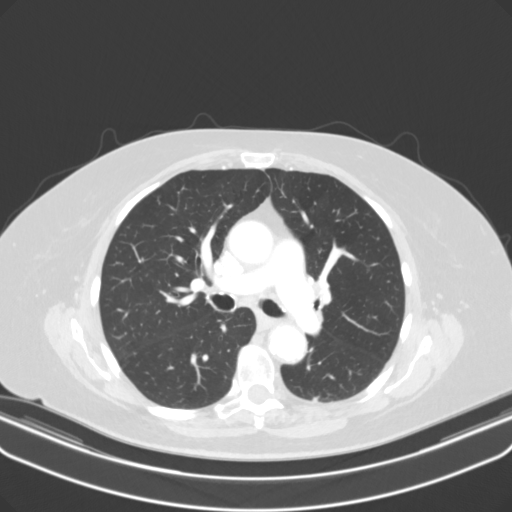
[im 97/145  lung]
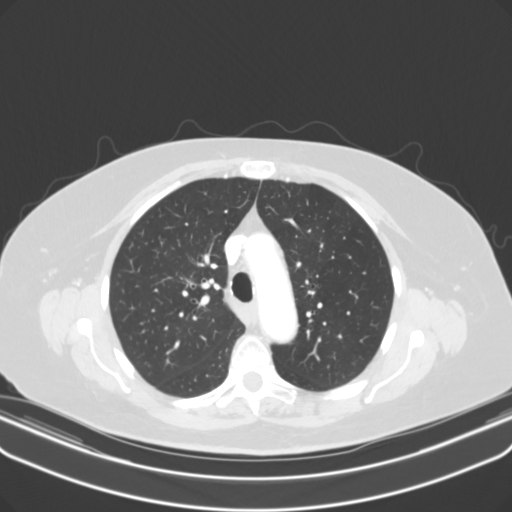
[im 110/145  mediastinal]
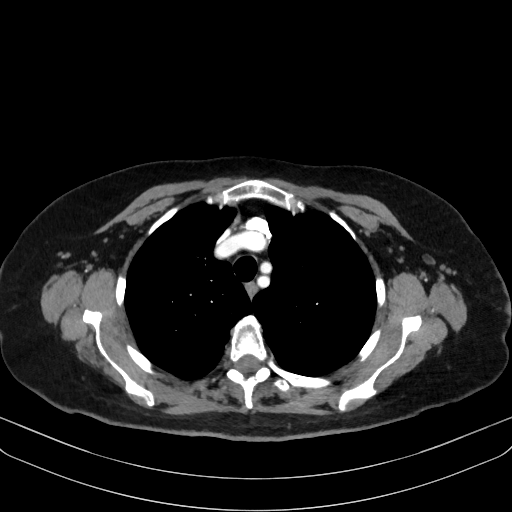
[im 110/145  lung]
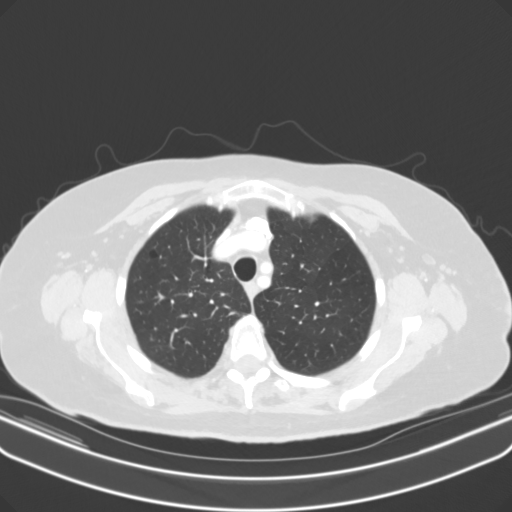
[im 124/145  lung]
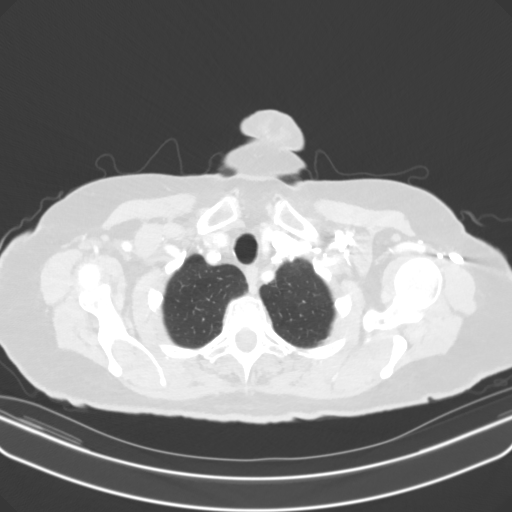
[im 138/145  lung]
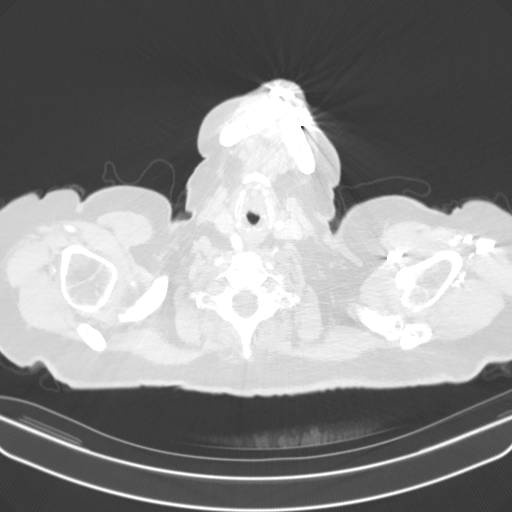

[Series 6: coronal · coronal · 0.59mm/px · 3 of 115 slices shown]
[im 23/115  lung]
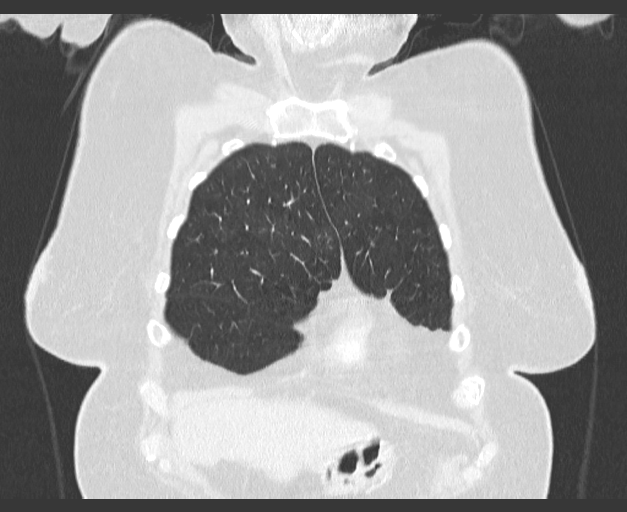
[im 46/115  lung]
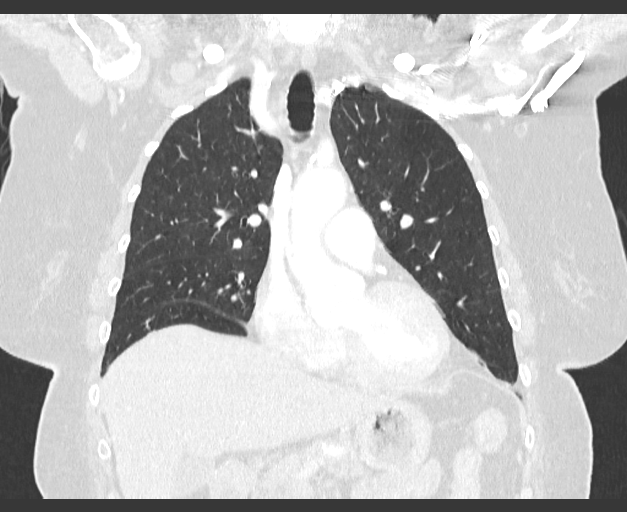
[im 69/115  lung]
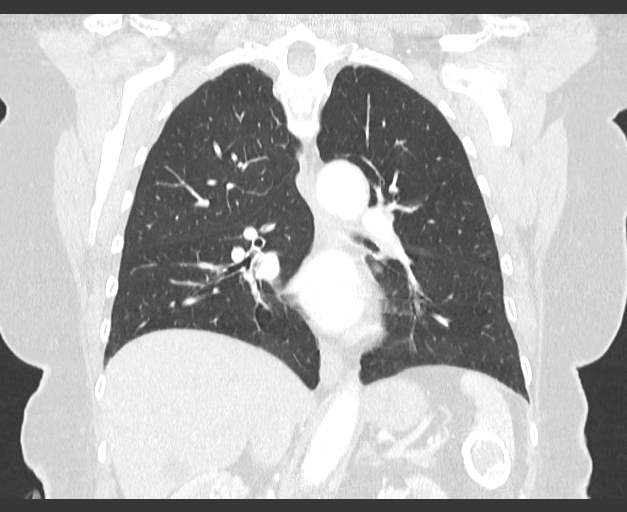

[14 of 36 positions shown; findings below may reference images not displayed]

Chest CT May 22, 2022.   
Count of known CT and Cardiac Nuclear Medicine studies performed in the previous 
12 months = 0.
FINDINGS: LUNGS AND PLEURA:  Mild elevation right hemidiaphragm. There is mucous plugging 
identified within the left upper lobe. Otherwise the large airways are patent. 
There is new focal airspace opacity in the left lower lobe suspicious for acute 
pneumonitis. No cavitation. Minimal emphysematous change. The previously noted 
subpleural micronodules within the right upper lobe are largely no longer 
identified. There are couple of geographically oriented micronodules image 59 
series 5, stable, and the largest measuring 2 mm. There is a new collection of 
geographically oriented micronodules in the anterior aspect right lower lobe 
image 92 series 5, likely infectious or inflammatory. No pleural effusion. 
Minimal stable subpleural scarring posteriorly within the left lower lobe image 
64. 
MEDIASTINUM:  No adenopathy. Normal heart size. No pericardial effusion. Severe 
coronary artery calcification. 
CHEST WALL/AXILLA: No mass or adenopathy.  
UPPER ABDOMEN: Hepatic cyst. Peripherally calcified cystic lesion in the 
inferior spleen is likely related to old trauma. 
MUSCULOSKELETAL: No acute abnormality.
IMPRESSION: Interval development of new focal airspace opacity left lower lobe suspicious 
for acute pneumonitis. Correlate clinically. Findings will be phoned to the 
referring clinicians office. 
There are few remaining pulmonary micronodules as well as a new collection of 
geographically oriented micronodules in the right lower lobe. These can be 
addressed on follow-up study and perhaps 2-3 months. 
Other incidental findings. 
In patients between the ages of 50-77 where pulmonary emphysema is noted on CT, 
recommend evaluation for low dose lung cancer screening protocol if patient is 
not already enrolled; as pulmonary emphysema is an independent risk factor for 
lung cancer. 
RADIATION DOSE REDUCTION: All CT scans are performed using radiation dose 
reduction techniques, when applicable.  Technical factors are evaluated and 
adjusted to ensure appropriate moderation of exposure.  Automated dose 
management technology is applied to adjust the radiation doses to minimize 
exposure while achieving diagnostic quality images.

## 2023-12-01 IMAGING — CT CT CHEST WITH CONTRAST
2 of 4 series · 15 of 36 positions shown, 18 images · IV contrast (APPLIED)
Comparison: CT 09/03/2023.

________________________________________________________________________________________________ 
CT CHEST WITH CONTRAST, 12/01/2023 [DATE]: 
CLINICAL INDICATION: Pneumonia, unspecified organism 
A search for DICOM formatted images was conducted for prior CT imaging studies 
completed at a non-affiliated media free facility.
TECHNIQUE: The chest was scanned from base of neck through the lung bases with 
75 mL of Isovue 300 MDV injected intravenously on a high resolution low dose CT 
scanner. Routine MPR and MIP reconstruction images were performed.

[Series 4: chest with 2.0 i31s 3 · axial · 0.72mm/px · z∈[-268,+4]mm · 12 of 152 slices shown, 15 images]
[im 8/152  mediastinal]
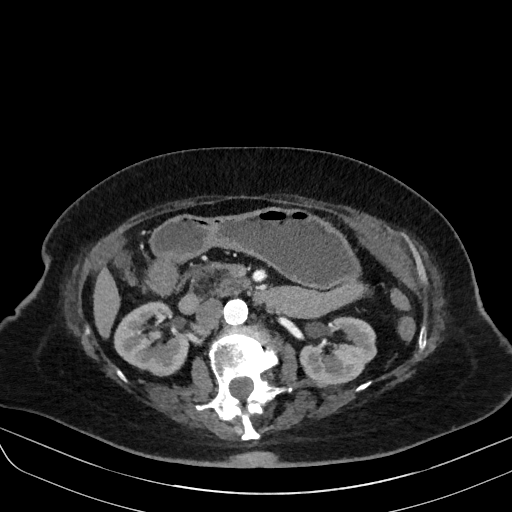
[im 8/152  lung]
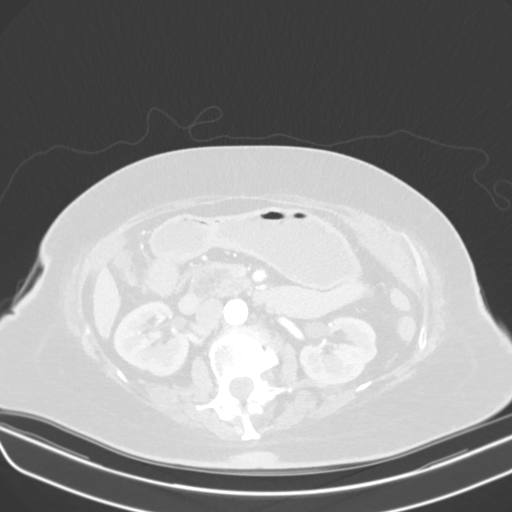
[im 22/152  lung]
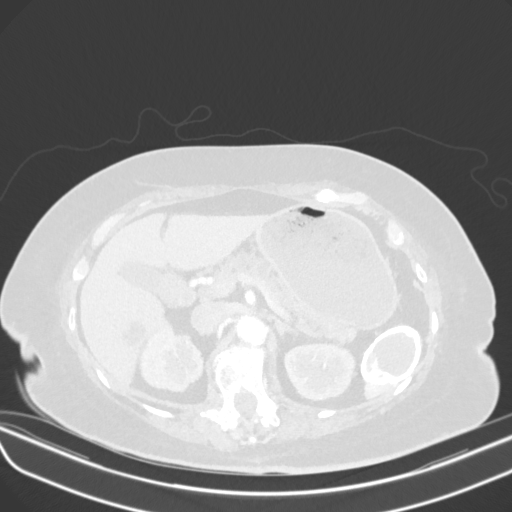
[im 36/152  lung]
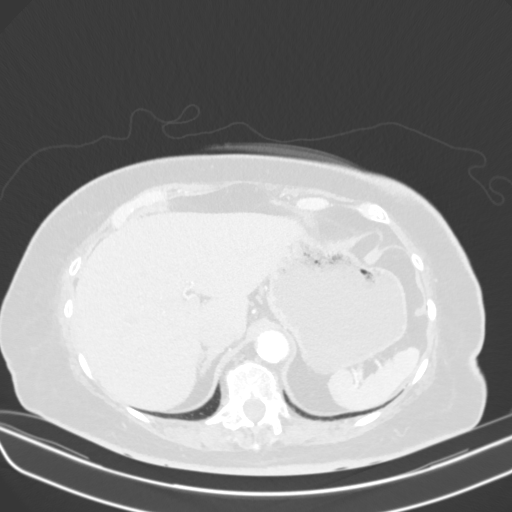
[im 44/152  lung]
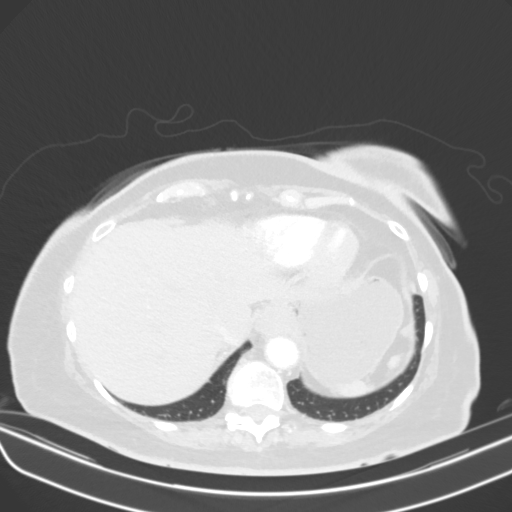
[im 58/152  mediastinal]
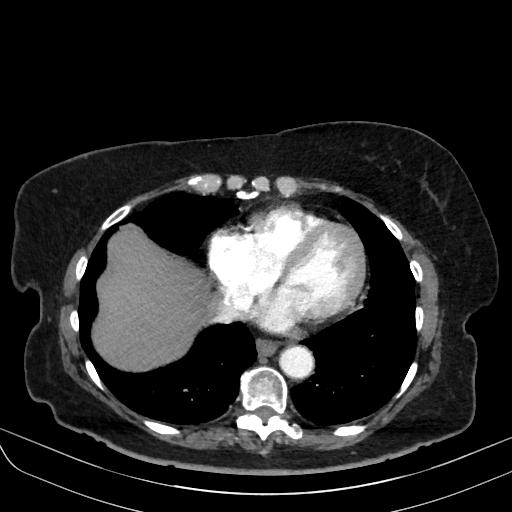
[im 58/152  lung]
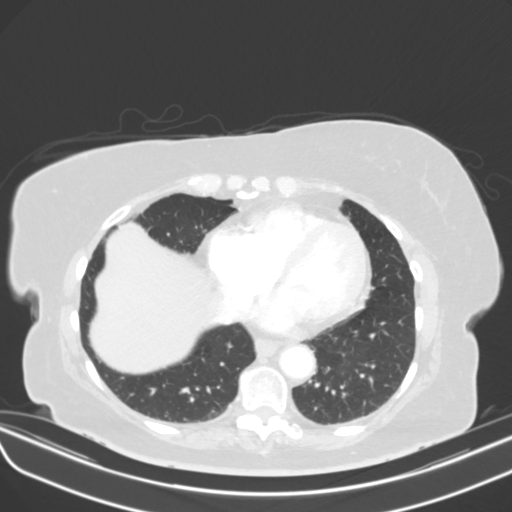
[im 72/152  lung]
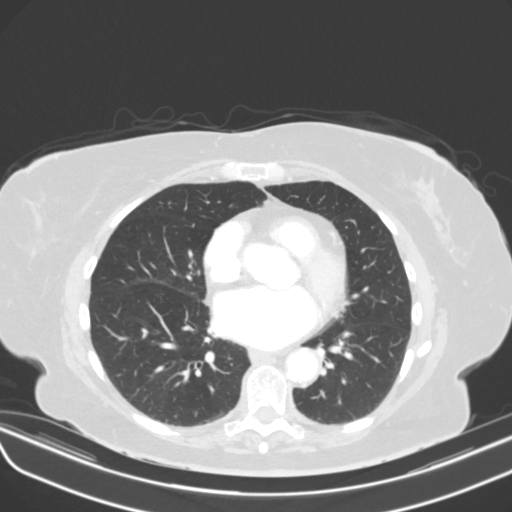
[im 80/152  lung]
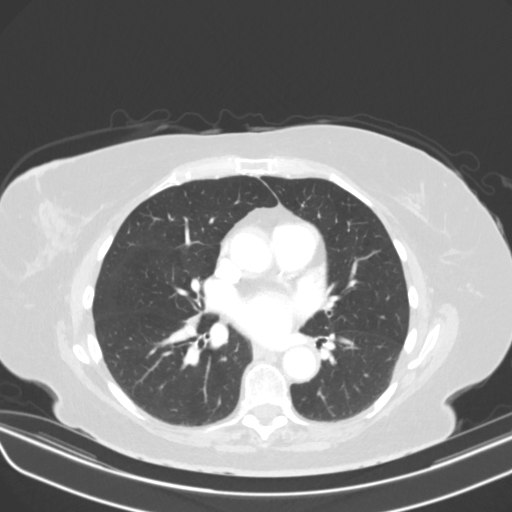
[im 94/152  lung]
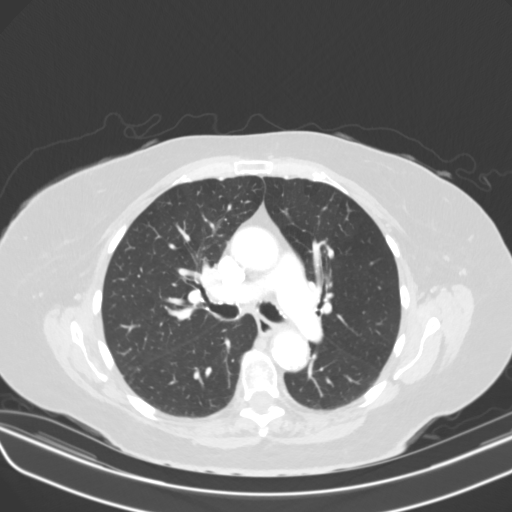
[im 108/152  mediastinal]
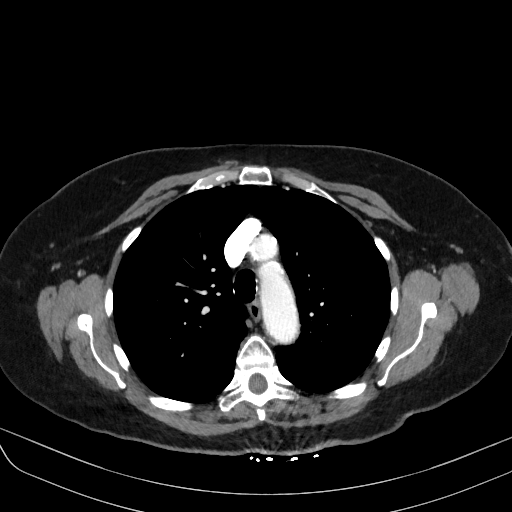
[im 108/152  lung]
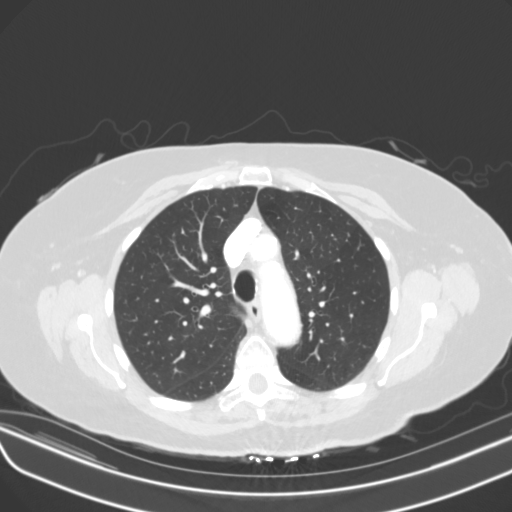
[im 116/152  lung]
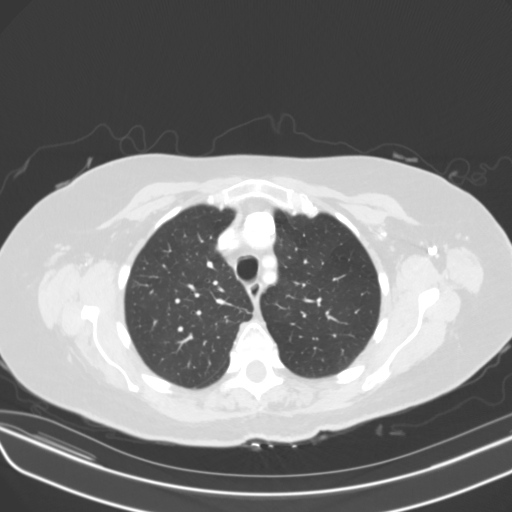
[im 130/152  lung]
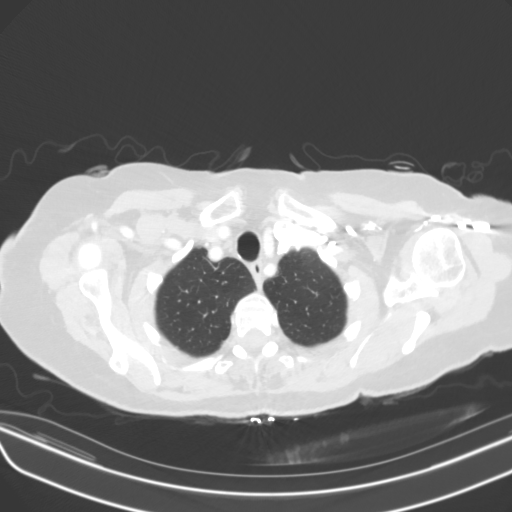
[im 144/152  lung]
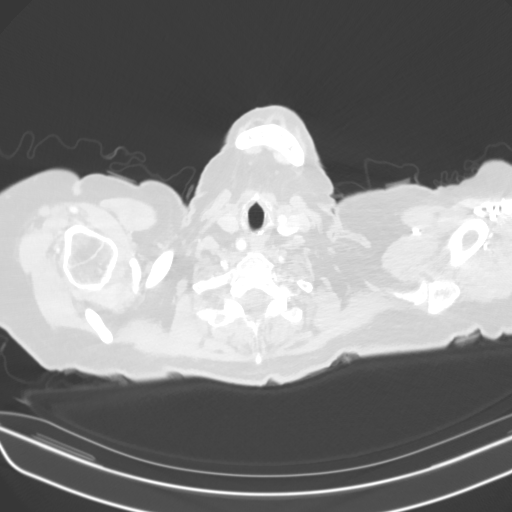

[Series 6: coronal · coronal · 0.61mm/px · 3 of 126 slices shown]
[im 26/126  lung]
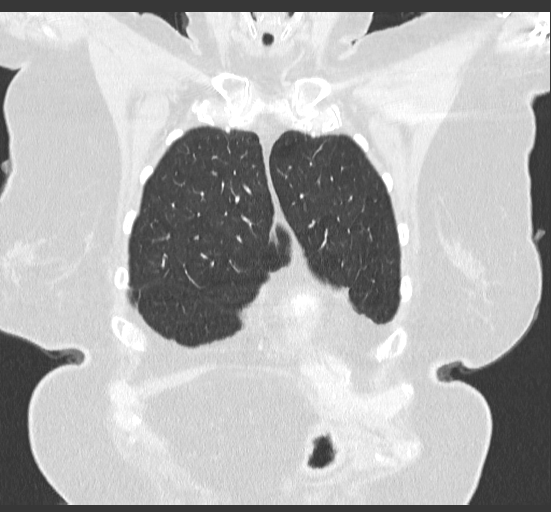
[im 51/126  lung]
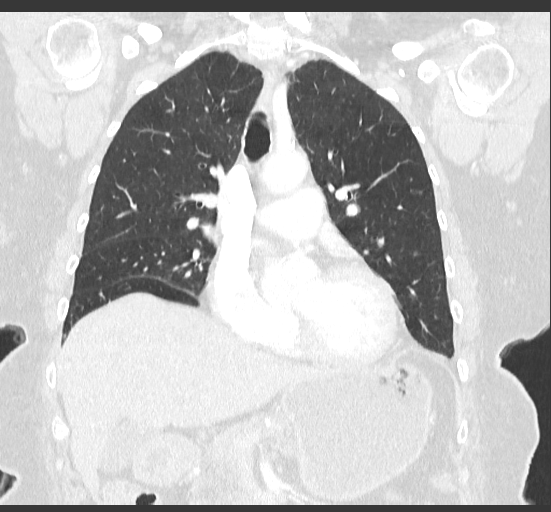
[im 76/126  lung]
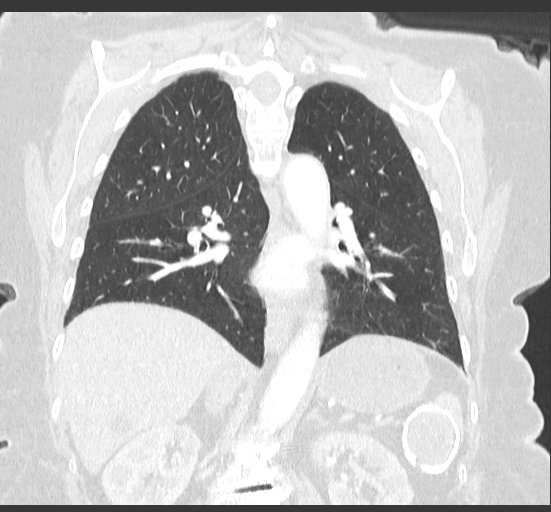

[15 of 36 positions shown; findings below may reference images not displayed]

Count of known CT and Cardiac Nuclear Medicine studies performed in the previous 
12 months = 1.
FINDINGS: LUNGS AND PLEURA:  Resolution of the area consolidation in the left lower lobe. 
Stable scattered calcified and noncalcified micronodules. No new suspicious 
pulmonary nodules. No pleural effusion.  
MEDIASTINUM:  No adenopathy. Normal heart size. No pericardial effusion. Severe 
coronary artery calcifications noted. 
CHEST WALL/AXILLA: No mass or adenopathy.  
UPPER ABDOMEN: : Hepatic cysts. Peripherally calcified cystic area within the 
inferior aspect of the spleen.. 
MUSCULOSKELETAL: No acute abnormality.
IMPRESSION: Resolution left lower lobe opacity. No new acute consolidation or effusion. 
Stable scattered noncalcified and calcified micronodules. 
No new mediastinal or hilar adenopathy. 
In patients between the ages of 50-77 where pulmonary emphysema is noted on CT, 
recommend evaluation for low dose lung cancer screening protocol if patient is 
not already enrolled; as pulmonary emphysema is an independent risk factor for 
lung cancer. 
RADIATION DOSE REDUCTION: All CT scans are performed using radiation dose 
reduction techniques, when applicable.  Technical factors are evaluated and 
adjusted to ensure appropriate moderation of exposure.  Automated dose 
management technology is applied to adjust the radiation doses to minimize 
exposure while achieving diagnostic quality images.

## 2024-01-21 IMAGING — DX HIP 2 VIEWS LEFT WITH PELVIS
2 series · 3 of 3 positions shown · non-contrast
Comparison: CT examination of the abdomen pelvis of 05/22/2022. Exams dating back 
to CT of 01/18/2010.

________________________________________________________________________________________________ 
HIP 2 VIEWS LEFT WITH PELVIS, 01/21/2024 [DATE]: 
CLINICAL INDICATION: Pain In Left Hip.

[AP (1 of 2)]
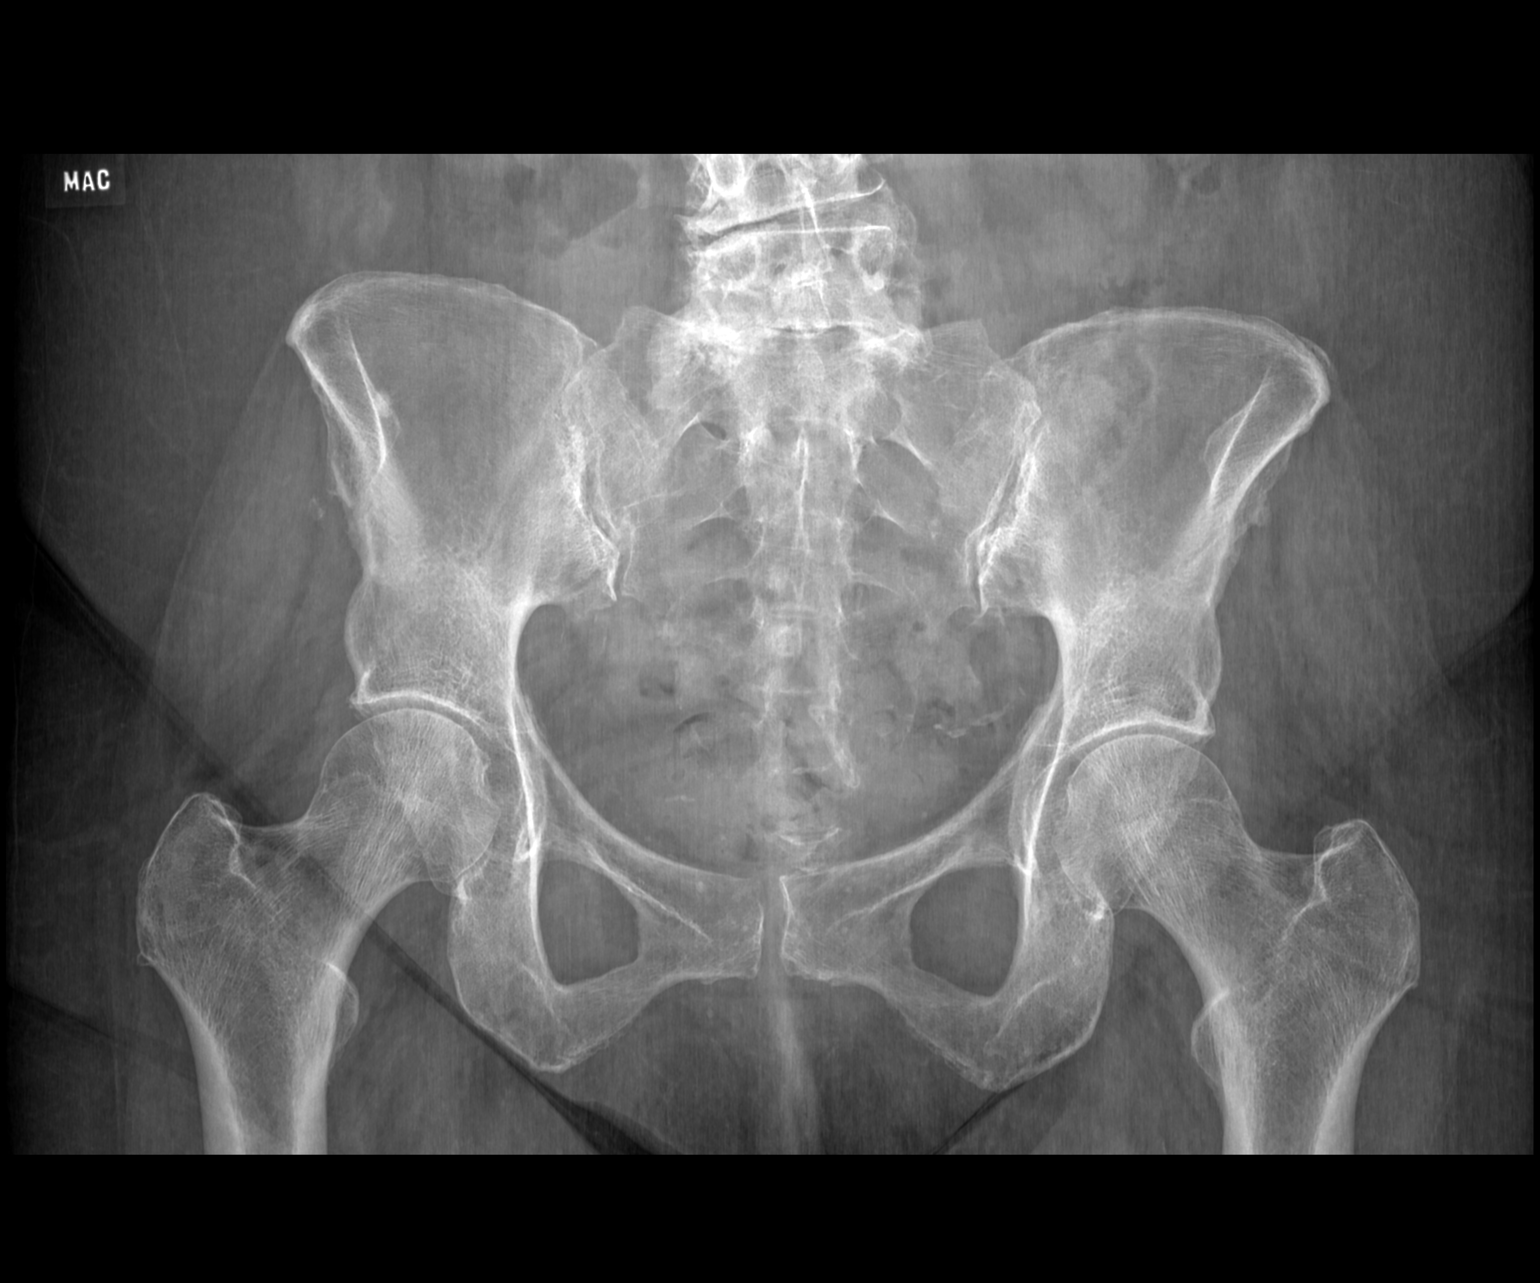

[Series 2: AP · 0.14mm/px · 2 of 2 slices shown (2 of 2)]
[im 1/2]
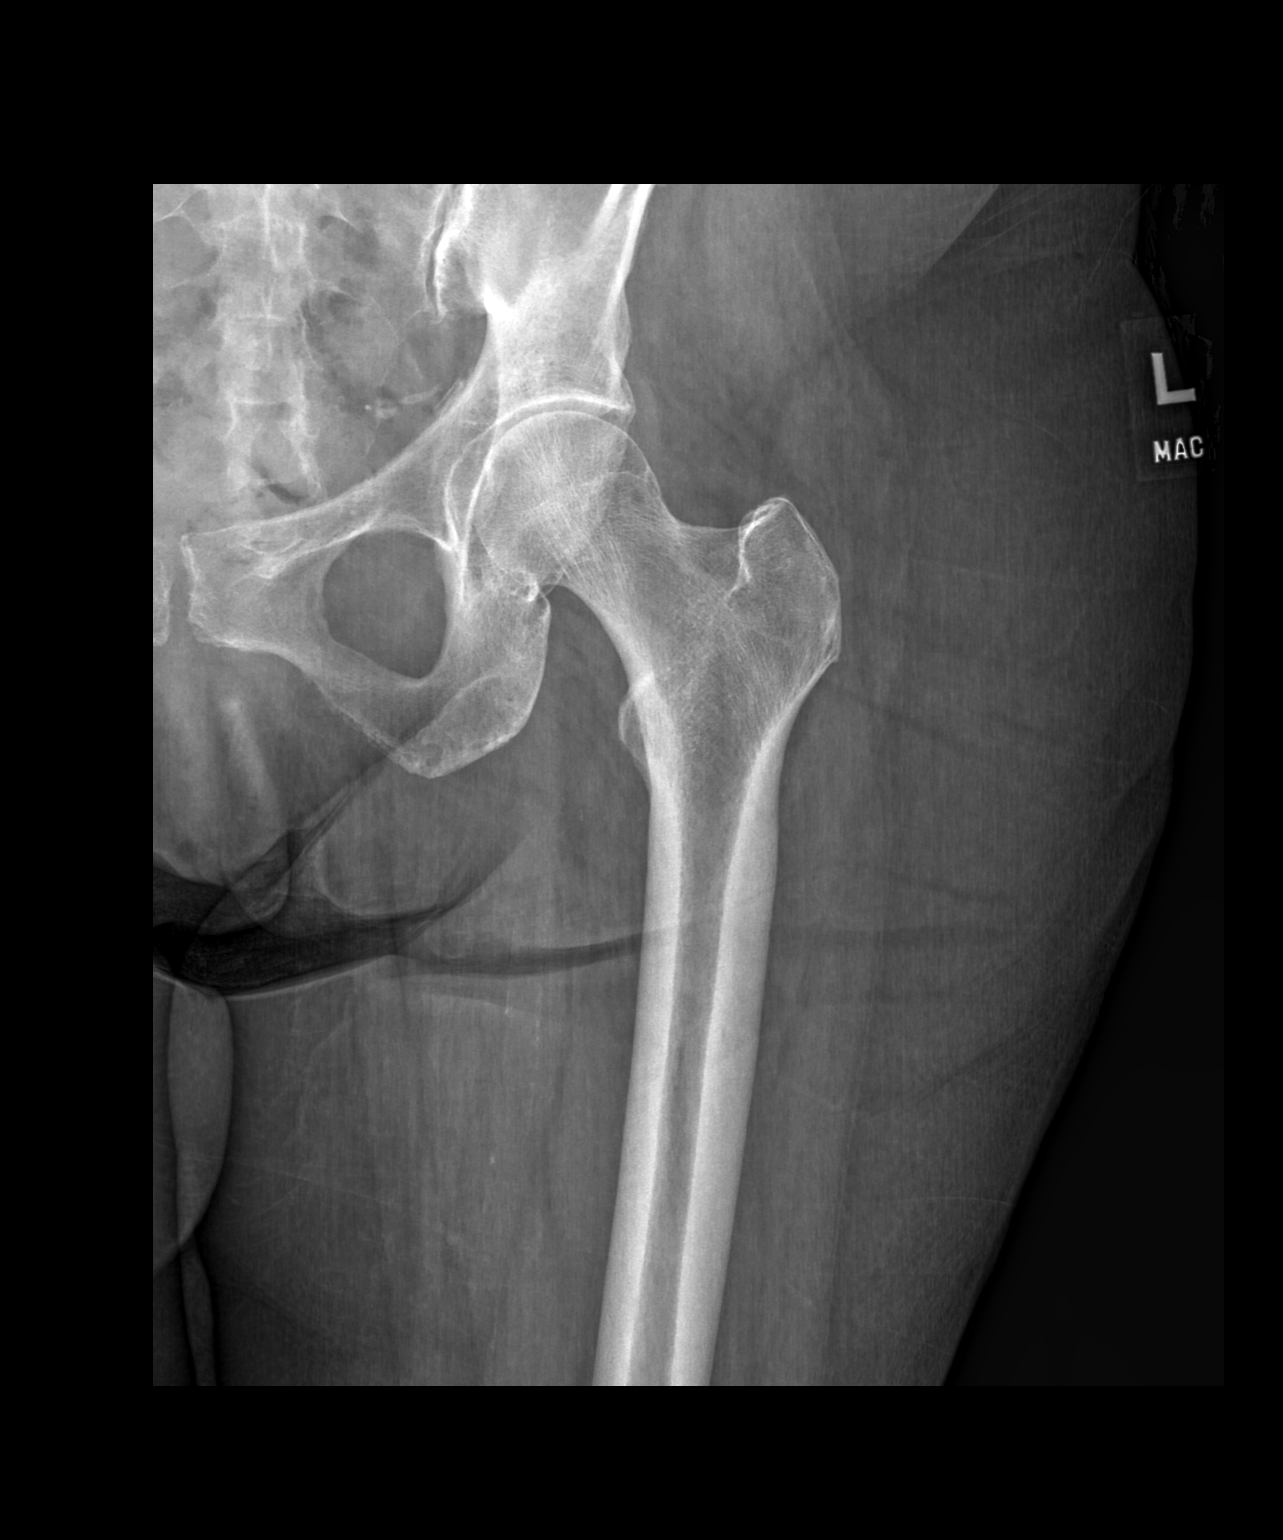
[im 2/2]
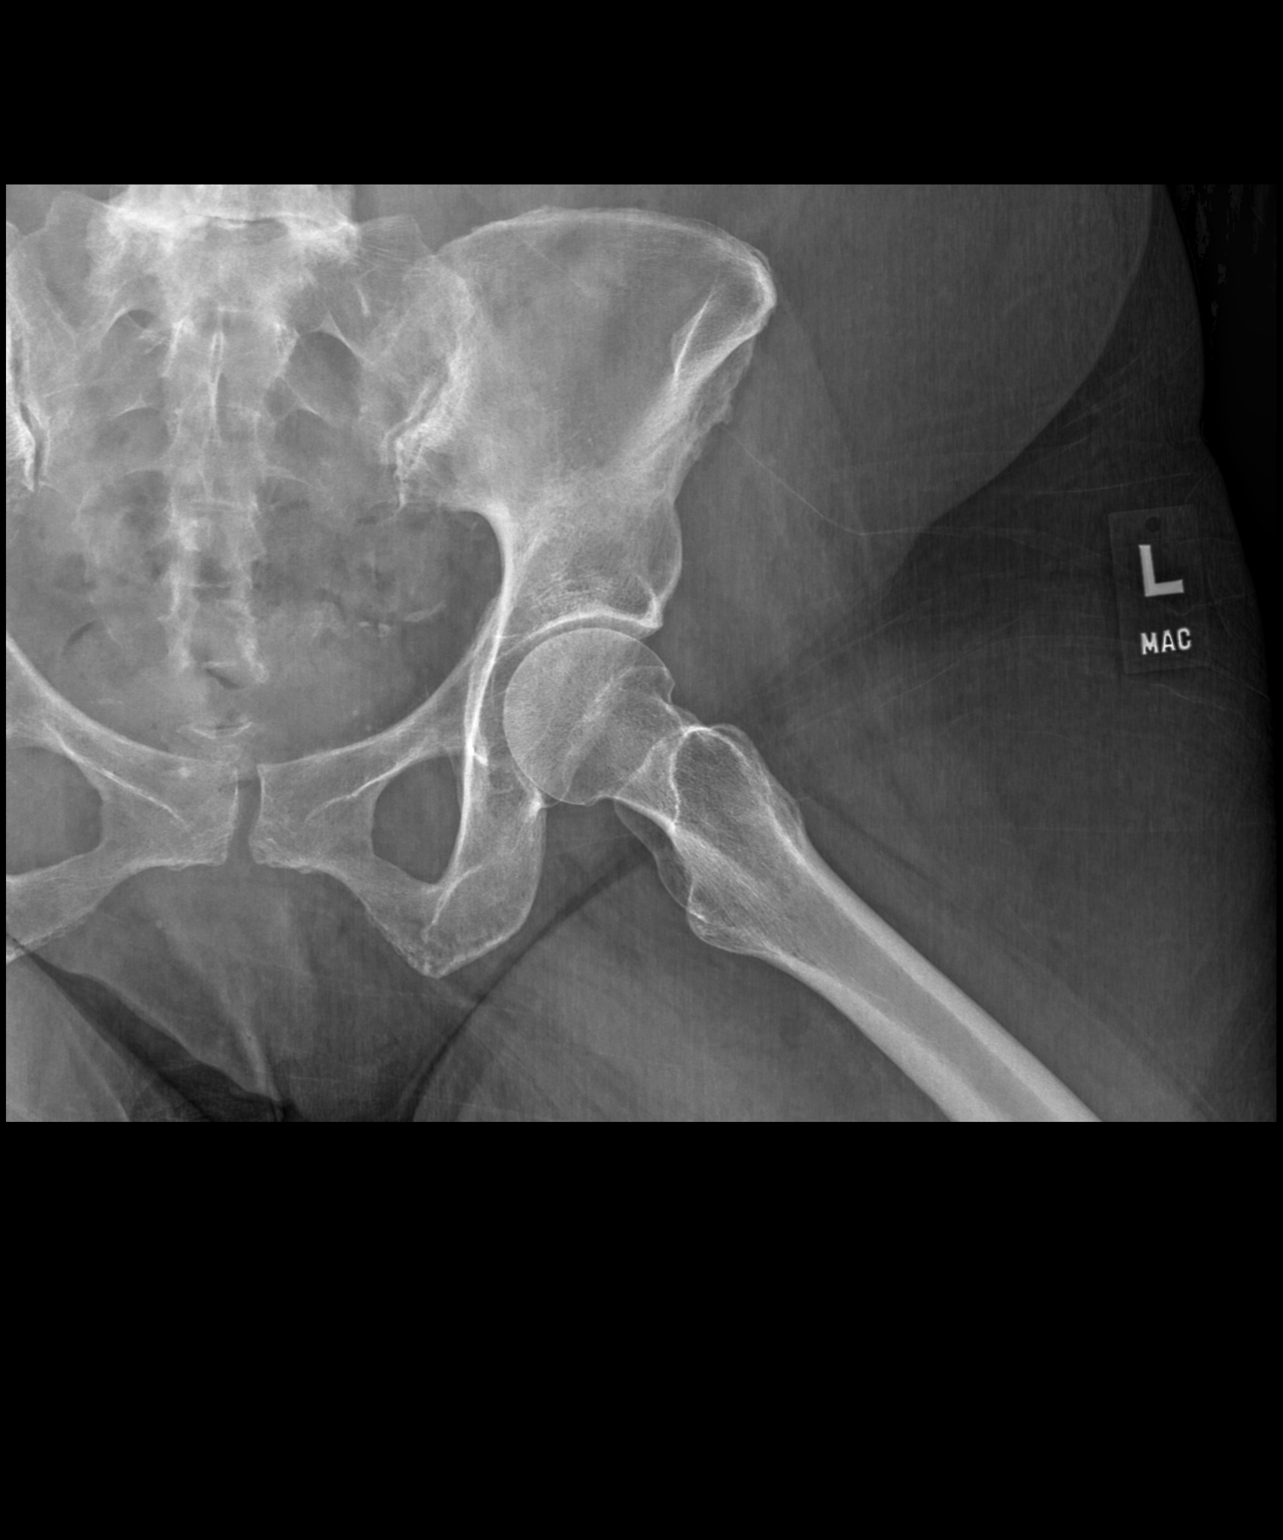

[3 of 3 positions shown; findings below may reference images not displayed]

FINDINGS: Mild joint space narrowing both hips. Lumbar curvature and advanced 
spondylotic changes included lower lumbar spine. Mild degenerative changes SI 
joints. Soft tissues are negative.
IMPRESSION: Degenerative changes including mild involvement of the hips.

## 2024-02-01 IMAGING — MR MRI LUMBAR SPINE WITHOUT CONTRAST
6 of 8 series · 11 of 48 positions shown · IV contrast (gadolinium)
Comparison: CT chest abdomen and pelvis May 22, 2022. MRI lumbar spine September 02, 2021.

________________________________________________________________________________________________ 
MRI LUMBAR SPINE WITHOUT CONTRAST, 02/01/2024 [DATE]: 
CLINICAL INDICATION: Spinal stenosis, lumbar region without neurogenic [place] , 
history of chronic low back pain. No trauma.
TECHNIQUE: Multiplanar, multiecho position MR images of the lumbar spine were 
performed without intravenous gadolinium enhancement.

[Series 101: survey · axial · 10.0mm · 1.25mm/px · 1 of 10 slices shown]
[im 1/10]
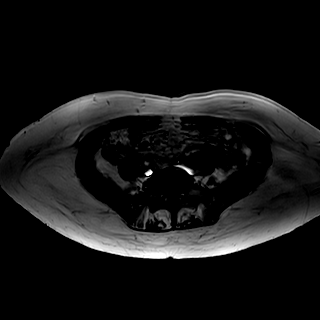

[Series 201: t2w_cor-surv · coronal · 6.0mm · 0.62mm/px · 1 of 10 slices shown]
[im 1/10]
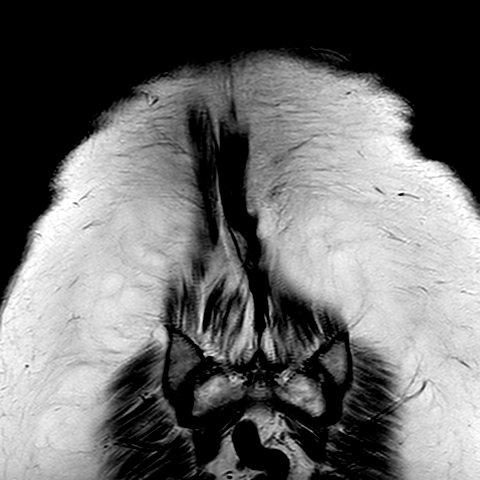

[Series 301: t1_tse_sag · sagittal · 4.0mm · 0.31mm/px · 2 of 19 slices shown]
[im 1/19]
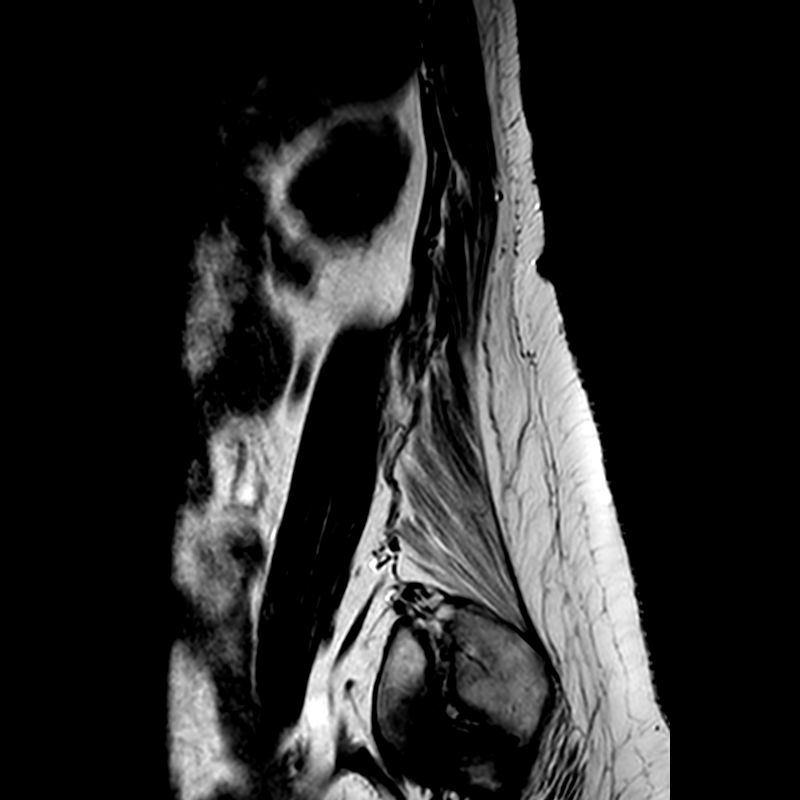
[im 19/19]
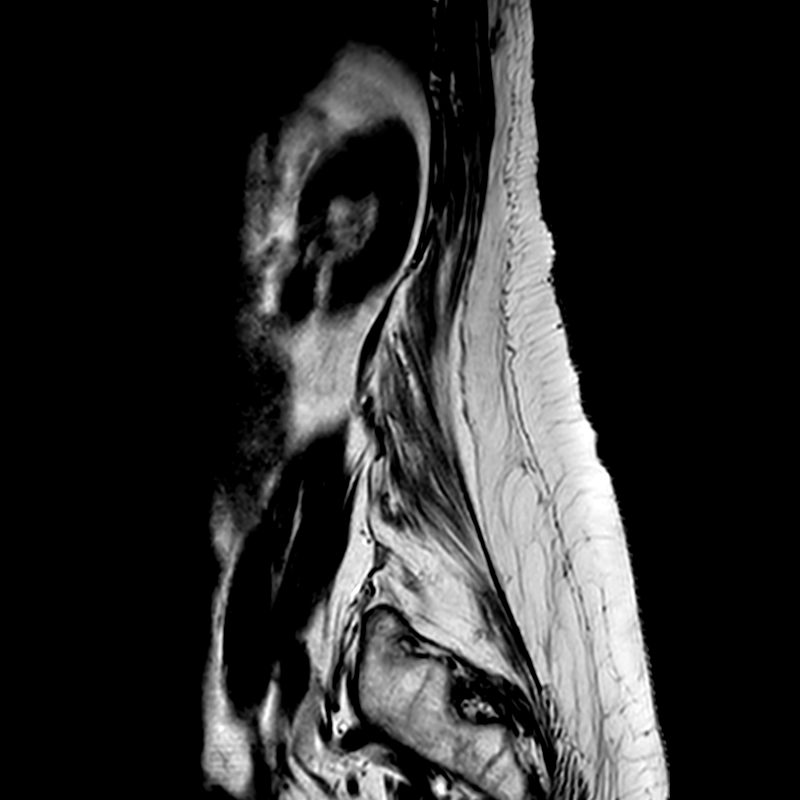

[Series 402: (id)_mdixon_tse · sagittal · 4.0mm · 0.48mm/px · 3 of 19 slices shown]
[im 1/19]
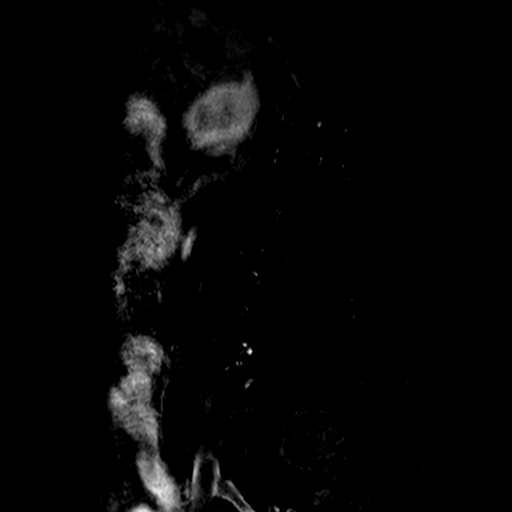
[im 10/19]
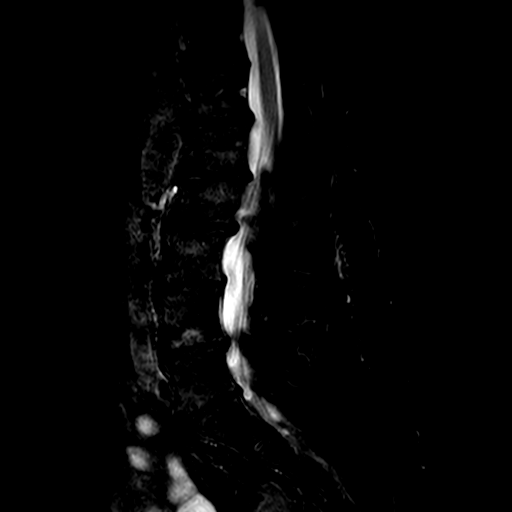
[im 19/19]
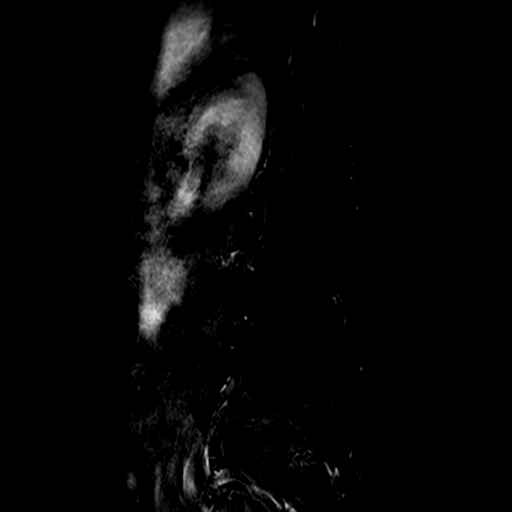

[Series 403: st2w_mdixon_tse · sagittal · 4.0mm · 0.48mm/px · 3 of 19 slices shown]
[im 1/19]
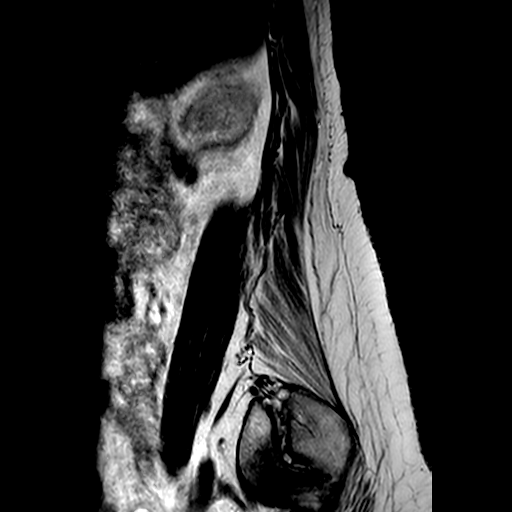
[im 10/19]
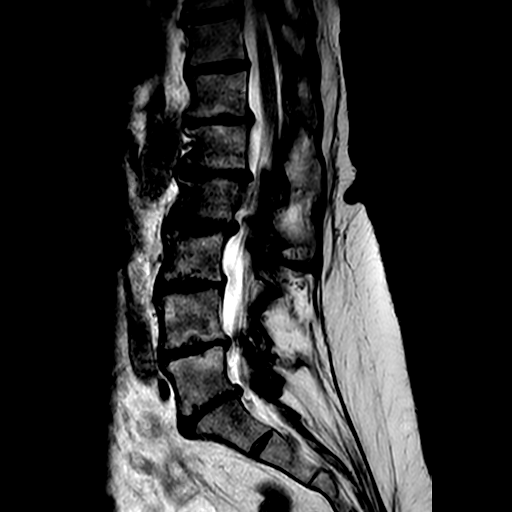
[im 19/19]
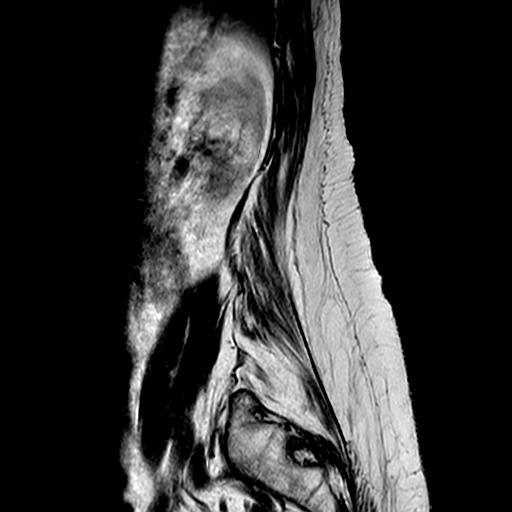

[Series 502: (id) view_ax mpr · axial · 1.0mm · 0.25mm/px · 1 of 139 slices shown]
[im 8/139]
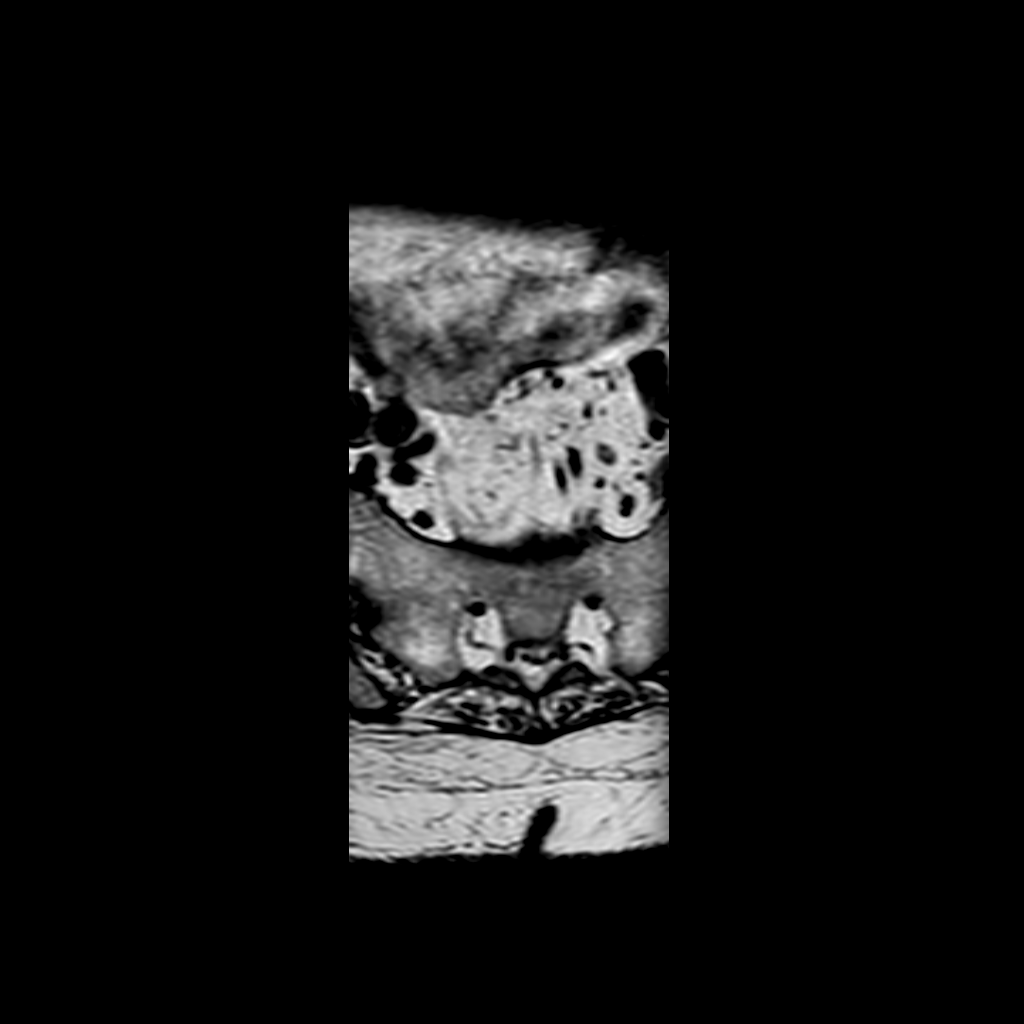

[11 of 48 positions shown; findings below may reference images not displayed]

FINDINGS: -------------------------------------------------------------------------------- 
------ 
GENERAL: 
Nomenclature is based on 5 lumbar type vertebral bodies.     
ALIGNMENT: Mild to moderate dextroconvex thoracic lumbar scoliosis.. Grade 1 
retrolisthesis L1 on L2 and L2 on L3. Grade 1 retrolisthesis L5 on S1. Stable. 
VERTEBRAL BODY HEIGHT: Normal.  
MARROW SIGNAL: No focal suspect signal abnormality. 
CORD SIGNAL: Normal distal spinal cord and cauda equina. Conus medullaris 
terminates at L2. 
ADDITIONAL FINDINGS: Colonic diverticulosis. Splenic cyst suboptimally 
visualized.. Hepatic cysts noted, incompletely visualized. Subcentimeter left 
renal cyst. 
Modic I-II: All levels except L5-S1. 
Ligamentum Flavum > 2.5 mm: All levels. 
-------------------------------------------------------------------------------- 
------ 
SEGMENTAL: 
T12-L1: Loss of disc height and signal with Schmorls nodes. Annular bulge with 
a focal left paracentral disc extrusion with disc material extending inferiorly. 
Very mild narrowing left lateral recess. Canal otherwise patent. Foramina patent 
with normal facets. Stable. 
L1-L2: Loss of disc height and signal with degenerative endplate irregularity. 
Posterior osteophytic ridging. Mild left lateral recess narrowing. Canal 
otherwise patent. Left-sided facet arthropathy. Foramina patent. Stable. 
L2-L3: Loss of disc height and signal with degenerative endplate irregularity 
and posterior osteophytic ridging. Canal patent. Mild facet arthropathy. Mild to 
moderate left and mild right foraminal narrowing. Stable. 
L3-L4: Posterior loss of disc height with loss of disc signal. Minimal annular 
bulge with patent canal. Mild facet arthropathy. Moderately severe left and 
right foramina narrowing. Stable. 
L4-L5: Loss of disc height and signal with degenerative endplate irregularity. 
Facet arthropathy with left facet joint effusion. Mild narrowing right lateral 
recess. Canal otherwise patent. Patent left foramen. Severe right foraminal 
narrowing. Stable. 
L5-S1: Loss of disc height and signal. Small size central disc extrusion with 
disc material extending inferiorly. This abuts and narrows the traversing left 
S1 nerve root within the left lateral recess. Canal otherwise patent. Facet 
arthropathy. Moderately severe right and mild left foraminal narrowing. Stable. 
-------------------------------------------------------------------------------- 
------
IMPRESSION: Stable lumbar degenerative and scoliotic changes. 
No critical or significant canal stenosis with variable and stable appearing 
multilevel lateral recess and foraminal narrowing detailed above.
